# Patient Record
Sex: Male | Born: 1945 | Race: White | Hispanic: No | Marital: Married | Smoking: Former smoker
Health system: Southern US, Community
[De-identification: ages and names within clinical notes are randomized; demographics above are authoritative.]

## PROBLEM LIST (undated history)

## (undated) DIAGNOSIS — C3490 Malignant neoplasm of unspecified part of unspecified bronchus or lung: Secondary | ICD-10-CM

## (undated) DIAGNOSIS — I340 Nonrheumatic mitral (valve) insufficiency: Secondary | ICD-10-CM

## (undated) DIAGNOSIS — J189 Pneumonia, unspecified organism: Secondary | ICD-10-CM

## (undated) DIAGNOSIS — N183 Chronic kidney disease, stage 3 unspecified: Secondary | ICD-10-CM

## (undated) DIAGNOSIS — E785 Hyperlipidemia, unspecified: Secondary | ICD-10-CM

## (undated) DIAGNOSIS — J449 Chronic obstructive pulmonary disease, unspecified: Secondary | ICD-10-CM

## (undated) DIAGNOSIS — I219 Acute myocardial infarction, unspecified: Secondary | ICD-10-CM

## (undated) DIAGNOSIS — F32A Depression, unspecified: Secondary | ICD-10-CM

## (undated) DIAGNOSIS — R011 Cardiac murmur, unspecified: Secondary | ICD-10-CM

## (undated) DIAGNOSIS — R413 Other amnesia: Secondary | ICD-10-CM

## (undated) DIAGNOSIS — F329 Major depressive disorder, single episode, unspecified: Secondary | ICD-10-CM

## (undated) DIAGNOSIS — I739 Peripheral vascular disease, unspecified: Secondary | ICD-10-CM

## (undated) DIAGNOSIS — M109 Gout, unspecified: Secondary | ICD-10-CM

## (undated) DIAGNOSIS — I10 Essential (primary) hypertension: Secondary | ICD-10-CM

## (undated) DIAGNOSIS — M1990 Unspecified osteoarthritis, unspecified site: Secondary | ICD-10-CM

## (undated) DIAGNOSIS — I2510 Atherosclerotic heart disease of native coronary artery without angina pectoris: Secondary | ICD-10-CM

## (undated) DIAGNOSIS — I509 Heart failure, unspecified: Secondary | ICD-10-CM

## (undated) DIAGNOSIS — C44721 Squamous cell carcinoma of skin of unspecified lower limb, including hip: Secondary | ICD-10-CM

## (undated) DIAGNOSIS — F410 Panic disorder [episodic paroxysmal anxiety] without agoraphobia: Secondary | ICD-10-CM

## (undated) DIAGNOSIS — E039 Hypothyroidism, unspecified: Secondary | ICD-10-CM

## (undated) DIAGNOSIS — F419 Anxiety disorder, unspecified: Secondary | ICD-10-CM

## (undated) DIAGNOSIS — I209 Angina pectoris, unspecified: Secondary | ICD-10-CM

## (undated) DIAGNOSIS — D179 Benign lipomatous neoplasm, unspecified: Secondary | ICD-10-CM

## (undated) HISTORY — DX: Peripheral vascular disease, unspecified: I73.9

## (undated) HISTORY — DX: Chronic kidney disease, stage 3 unspecified: N18.30

## (undated) HISTORY — PX: HEMORRHOIDECTOMY WITH HEMORRHOID BANDING: SHX5633

## (undated) HISTORY — PX: APPENDECTOMY: SHX54

## (undated) HISTORY — DX: Nonrheumatic mitral (valve) insufficiency: I34.0

## (undated) HISTORY — DX: Malignant neoplasm of unspecified part of unspecified bronchus or lung: C34.90

## (undated) HISTORY — DX: Anxiety disorder, unspecified: F41.9

## (undated) HISTORY — DX: Atherosclerotic heart disease of native coronary artery without angina pectoris: I25.10

## (undated) HISTORY — PX: CARDIAC CATHETERIZATION: SHX172

## (undated) HISTORY — DX: Chronic kidney disease, stage 3 (moderate): N18.3

## (undated) HISTORY — DX: Cardiac murmur, unspecified: R01.1

## (undated) HISTORY — DX: Other amnesia: R41.3

## (undated) HISTORY — DX: Chronic obstructive pulmonary disease, unspecified: J44.9

## (undated) HISTORY — DX: Hyperlipidemia, unspecified: E78.5

## (undated) HISTORY — DX: Benign lipomatous neoplasm, unspecified: D17.9

## (undated) HISTORY — DX: Panic disorder (episodic paroxysmal anxiety): F41.0

## (undated) HISTORY — PX: HERNIA REPAIR: SHX51

## (undated) HISTORY — PX: COLONOSCOPY: SHX174

## (undated) HISTORY — PX: TONSILLECTOMY: SUR1361

## (undated) HISTORY — PX: SQUAMOUS CELL CARCINOMA EXCISION: SHX2433

---

## 1983-10-22 HISTORY — PX: UMBILICAL HERNIA REPAIR: SHX196

## 1995-10-22 DIAGNOSIS — I209 Angina pectoris, unspecified: Secondary | ICD-10-CM

## 1995-10-22 DIAGNOSIS — I219 Acute myocardial infarction, unspecified: Secondary | ICD-10-CM

## 1995-10-22 HISTORY — DX: Acute myocardial infarction, unspecified: I21.9

## 1995-10-22 HISTORY — PX: CORONARY ARTERY BYPASS GRAFT: SHX141

## 1995-10-22 HISTORY — DX: Angina pectoris, unspecified: I20.9

## 2008-08-12 ENCOUNTER — Ambulatory Visit: Payer: Self-pay | Admitting: Cardiology

## 2008-08-24 ENCOUNTER — Ambulatory Visit: Payer: Self-pay | Admitting: Cardiology

## 2008-08-29 ENCOUNTER — Ambulatory Visit: Payer: Self-pay | Admitting: Cardiology

## 2009-02-28 ENCOUNTER — Ambulatory Visit: Payer: Self-pay | Admitting: Cardiology

## 2009-07-06 ENCOUNTER — Encounter: Payer: Self-pay | Admitting: Cardiology

## 2009-08-06 DIAGNOSIS — E785 Hyperlipidemia, unspecified: Secondary | ICD-10-CM

## 2009-08-06 DIAGNOSIS — I739 Peripheral vascular disease, unspecified: Secondary | ICD-10-CM | POA: Insufficient documentation

## 2009-08-06 DIAGNOSIS — R011 Cardiac murmur, unspecified: Secondary | ICD-10-CM

## 2009-08-06 DIAGNOSIS — I25810 Atherosclerosis of coronary artery bypass graft(s) without angina pectoris: Secondary | ICD-10-CM

## 2011-03-05 NOTE — Assessment & Plan Note (Signed)
[facility]                          [facility]   RYE, DECOSTE                     MRN:          [id number]  DATE:02/28/2009                            DOB:          1945-11-03    HISTORY OF PRESENT ILLNESS:  Mr. Bloxham returns for Cardiology  followup.  I had seen him last on August 12, 2008.  At that time, we  decided to do a 2-D echo.  Also we switched his Plavix to aspirin.  Since that time he has done relatively well.  He has developed some GI  symptoms and this could be related to the aspirin.  We will switch him  back to Plavix.  His wife will be in note today asking me if any  decreased memory could be related to the switch from Plavix to aspirin.  I doubt this is the case.  In general, I think he needs to follow up  with Dr. Dimas Aguas for overall medical followup.   A 2-D echo revealed good LV function.  His EF was in the 50-55% range.  He had mild mitral regurgitation.   PAST MEDICAL HISTORY:   ALLERGIES:  No known drug allergies.   MEDICATIONS:  1. Aspirin (to be stopped and switched back to Plavix).  2. Potassium.  3. Allopurinol.  4. Simvastatin.  5. Lisinopril.  6. Furosemide.   OTHER MEDICAL PROBLEMS:  See the list below.   REVIEW OF SYSTEMS:  He is not having any fevers, chills, or skin rashes.  There are no headaches.  There is no change in his vision or his  hearing.  He is not having a cough.  He has some mild shortness of  breath.  There is no chest pain.  He is not having any GU symptoms.  As  mentioned, he is having some symptoms that might represent some  irritation from his aspirin in his upper GI tract.  He is not having any  swelling.  All other systems are reviewed and are negative.   PHYSICAL EXAMINATION:  VITAL SIGNS:  Blood pressure is 132/72 with a  pulse of 65.  GENERAL:  The patient is oriented to person, time and place.  Affect is  normal.  He does smell of cigarette smoke.  HEENT:  Reveals no xanthelasma.  He has normal extraocular motion.  NECK:  There are no carotid bruits.  There is no jugular venous  distention.  LUNGS:  Clear.  Respiratory effort is not labored.  CARDIAC:  Reveals S1 with an S2.  There are no clicks or significant  murmurs.  ABDOMEN:  Soft.  EXTREMITIES:  He has no peripheral edema.   PROBLEMS:  Include  1. Systolic murmur.  There are no major abnormalities by      echocardiogram.  2. Ongoing smoking.  Once again we talked about this.  He will      consider trying the nicotine patches.  3. Lipoma of the left thigh in the past.  4. Coronary artery disease post coronary artery bypass graft in 1997.      His last exercise test  was in 2004.  He is not having any      significant symptoms.  5. History of ejection fraction in the 50% range.  Recent 2-D      echocardiogram re-verified, his ejection fraction is in the 50-55%      range.  6. Peripheral vascular disease followed by Dr. Waverly Ferrari in      [place].  7. Significant chronic obstructive pulmonary disease.  8. History of anxiety and panic attacks.  9. Hyperlipidemia, treated.  10.Decreased memory per the wife.  He needs to follow up with Dr.      Dimas Aguas.   The patient is stable.  We will switch him back from aspirin to Plavix,  as he seems to do better with this.  I will see him for Cardiology  followup in 6 months.     Luis Abed, MD, [facility]  Electronically Signed    JDK/MedQ  DD: 02/28/2009  DT: 03/01/2009  Job #: [id number]   cc:   Selinda Flavin

## 2011-03-05 NOTE — Assessment & Plan Note (Signed)
[facility]                          [facility]   Manuel Rivera                     MRN:          [id number]  DATE:08/29/2008                            DOB:          01-Jun-1946    Mr. Manuel Rivera re-established with [place] on August 12, 2008.  At that time,  there was a soft systolic murmur and decision was made to proceed with  an echo to assess for murmurs and reassess LV function.  His echo  revealed ejection fraction 50-55%.  There was only mild mitral  regurgitation.  He is not having any significant chest pain.  He is  having some problems with erectile dysfunction.   ALLERGIES:  No known drug allergies.   MEDICATIONS:  See list in the chart.   REVIEW OF SYSTEMS:  He has no GI or GU symptoms.  He is having some  erectile dysfunction.  He has no fevers or chills or skin rashes.  Otherwise, review of systems is negative.   PHYSICAL EXAMINATION:  Blood pressure 140/80 with a pulse of 62.  The  patient is oriented to person, time and place.  Affect is normal.  HEENT  reveals no xanthelasma.  He has normal extraocular motion.  There are no  carotid bruits.  There is no jugular venous distention.  Lungs are  clear.  Respiratory effort is not labored.  Cardiac exam reveals an S1  with an S2.  There are no clicks.  There is a soft systolic murmur.  The  abdomen is soft.  He has no peripheral edema.   Problems are listed on my note of August 12, 2008.  His coronary artery  disease is stable.  With his peripheral vascular disease, we do need to  proceed with a carotid Doppler and this will be arranged.  Also since he  is not using nitroglycerin, it is okay with me for him to use Viagra.  [name].   I will see him in 6 months.  At that time, we will consider whether we  should proceed with exercise testing.  With his Plavix runs out, he will  start full-dose aspirin.     Manuel Abed, MD,  [facility]  Electronically Signed    JDK/MedQ  DD: 08/29/2008  DT: 08/29/2008  Job #: [id number]   cc:   Selinda Flavin

## 2011-03-05 NOTE — Assessment & Plan Note (Signed)
[facility]                          [facility]   TRI, CHITTICK                     MRN:          [id number]  DATE:08/12/2008                            DOB:          February 08, 1946    HISTORY OF PRESENT ILLNESS:  Mr. Ayo is here to re-establish cardiac  care.  I saw him last in 2004.  He does have known coronary artery  disease.  He has not been having any significant chest pain.  He has  been under enormous stress with his daughter of having been hospitalized  for 6 months and now is on a ventilator at his home.  He has tried to  stay off of cigarettes.  He has quit twice overtime and is smoking  again.  He is going to begin nicotine patches.  He is very active.  He  had been having difficulty with pain in his legs and had peripheral  vascular disease.  He was on Plavix through Dr. Cari Caraway.  With  exercising, his legs improved and he is not having any significant  claudication at this time.   The patient has known coronary artery disease.  He underwent CABG in  1997.  Historically, his ejection fraction is in the 50% range.  He is  also followed for hyperlipidemia.   PAST MEDICAL HISTORY:   ALLERGIES:  No known drug allergies.   MEDICATIONS:  1. Plavix 75 (to be changed back to full dose aspirin).  2. Potassium 20.  3. Allopurinol 300.  4. Simvastatin 40.  5. Lisinopril 10.  6. Furosemide 80.  7. Alprazolam for anxiety.   OTHER MEDICAL PROBLEMS:  See the list below.   REVIEW OF SYSTEMS:  He is not having any GI or GU symptoms.  He has no  fevers or chills.  Otherwise, his review of systems is negative.   PHYSICAL EXAMINATION:  VITAL SIGNS:  Blood pressure is 145/86 with a  pulse of 76.  GENERAL:  The patient is oriented to person, time, and place.  Affect is  normal.  HEENT:  Reveals no xanthelasma.  He has normal extraocular motion.  NECK:  There are no carotid bruits.  There is no jugular venous  distention.  LUNGS:  Clear.  Respiratory effort is nonlabored.  CARDIAC:  Reveals an S1 with an S2.  There is a soft systolic murmur.  ABDOMEN:  Soft.  EXTREMITIES:  He has no peripheral edema.   EKG reveals normal sinus rhythm.  He has mild decreased anterior R-wave  progression.  This is unchanged from the past.   PROBLEMS INCLUDE:  1. Systolic murmur.  He needs a 2D echo to assess this further.  2. Ongoing smoking.  I have talked with him about this and he will      start to use nitroglycerin patches.  3. General disability.  4. Lipoma of his left thigh in the past.  5. Coronary artery disease, post coronary artery bypass graft x4 in      1997.  I have decided not to proceed with an exercise test at this  time.  He had a study in 2004.  He is not having any significant      symptoms.  6. History of ejection fraction in the 50% range.  We do need a 2D      echo to assess his left ventricular function and his valvular      function.  7. Peripheral vascular disease.  This had been followed by Dr. Cari Caraway, but with exercise, the patient is much improved.  8. Chronic obstructive pulmonary disease.  9. History of anxiety and panic attacks.  10.Hyperlipidemia on medication through Dr. Selinda Flavin.   Mr. Lortie is stable at this point.  We will proceed with a 2D echo to  assess the issues mentioned above.  I will then see him back and discuss  these issues and review the data with him.  The patient will change his  Plavix to aspirin when his Plavix runs out.     Luis Abed, MD, [facility]  Electronically Signed    JDK/MedQ  DD: 08/12/2008  DT: 08/13/2008  Job #: [id number]   cc:   Selinda Flavin

## 2012-10-21 DIAGNOSIS — C3490 Malignant neoplasm of unspecified part of unspecified bronchus or lung: Secondary | ICD-10-CM

## 2012-10-21 HISTORY — DX: Malignant neoplasm of unspecified part of unspecified bronchus or lung: C34.90

## 2012-10-21 HISTORY — PX: BRONCHIAL WASHINGS: SHX5105

## 2013-04-21 DIAGNOSIS — F17200 Nicotine dependence, unspecified, uncomplicated: Secondary | ICD-10-CM

## 2013-04-21 DIAGNOSIS — C3410 Malignant neoplasm of upper lobe, unspecified bronchus or lung: Secondary | ICD-10-CM

## 2013-04-21 DIAGNOSIS — Q619 Cystic kidney disease, unspecified: Secondary | ICD-10-CM

## 2013-04-21 DIAGNOSIS — I10 Essential (primary) hypertension: Secondary | ICD-10-CM

## 2013-04-27 DIAGNOSIS — C3410 Malignant neoplasm of upper lobe, unspecified bronchus or lung: Secondary | ICD-10-CM

## 2013-04-27 DIAGNOSIS — Z5111 Encounter for antineoplastic chemotherapy: Secondary | ICD-10-CM

## 2013-04-28 DIAGNOSIS — C3410 Malignant neoplasm of upper lobe, unspecified bronchus or lung: Secondary | ICD-10-CM

## 2013-04-28 DIAGNOSIS — Z5111 Encounter for antineoplastic chemotherapy: Secondary | ICD-10-CM

## 2013-04-29 DIAGNOSIS — Z5111 Encounter for antineoplastic chemotherapy: Secondary | ICD-10-CM

## 2013-04-29 DIAGNOSIS — C3410 Malignant neoplasm of upper lobe, unspecified bronchus or lung: Secondary | ICD-10-CM

## 2013-04-30 DIAGNOSIS — C3410 Malignant neoplasm of upper lobe, unspecified bronchus or lung: Secondary | ICD-10-CM

## 2013-04-30 DIAGNOSIS — Z5189 Encounter for other specified aftercare: Secondary | ICD-10-CM

## 2013-05-05 DIAGNOSIS — C3410 Malignant neoplasm of upper lobe, unspecified bronchus or lung: Secondary | ICD-10-CM

## 2013-05-17 DIAGNOSIS — C3490 Malignant neoplasm of unspecified part of unspecified bronchus or lung: Secondary | ICD-10-CM

## 2013-05-17 DIAGNOSIS — N289 Disorder of kidney and ureter, unspecified: Secondary | ICD-10-CM

## 2013-05-18 DIAGNOSIS — Z5111 Encounter for antineoplastic chemotherapy: Secondary | ICD-10-CM

## 2013-05-18 DIAGNOSIS — C3490 Malignant neoplasm of unspecified part of unspecified bronchus or lung: Secondary | ICD-10-CM

## 2013-05-19 DIAGNOSIS — C3490 Malignant neoplasm of unspecified part of unspecified bronchus or lung: Secondary | ICD-10-CM

## 2013-05-19 DIAGNOSIS — Z5111 Encounter for antineoplastic chemotherapy: Secondary | ICD-10-CM

## 2013-05-20 DIAGNOSIS — Z5111 Encounter for antineoplastic chemotherapy: Secondary | ICD-10-CM

## 2013-05-20 DIAGNOSIS — C3490 Malignant neoplasm of unspecified part of unspecified bronchus or lung: Secondary | ICD-10-CM

## 2013-05-21 DIAGNOSIS — Z5189 Encounter for other specified aftercare: Secondary | ICD-10-CM

## 2013-05-21 DIAGNOSIS — C3410 Malignant neoplasm of upper lobe, unspecified bronchus or lung: Secondary | ICD-10-CM

## 2013-06-15 DIAGNOSIS — Z5111 Encounter for antineoplastic chemotherapy: Secondary | ICD-10-CM

## 2013-06-15 DIAGNOSIS — C3490 Malignant neoplasm of unspecified part of unspecified bronchus or lung: Secondary | ICD-10-CM

## 2013-06-16 DIAGNOSIS — Z5111 Encounter for antineoplastic chemotherapy: Secondary | ICD-10-CM

## 2013-06-16 DIAGNOSIS — C3490 Malignant neoplasm of unspecified part of unspecified bronchus or lung: Secondary | ICD-10-CM

## 2013-06-17 DIAGNOSIS — C3490 Malignant neoplasm of unspecified part of unspecified bronchus or lung: Secondary | ICD-10-CM

## 2013-06-17 DIAGNOSIS — Z5111 Encounter for antineoplastic chemotherapy: Secondary | ICD-10-CM

## 2013-06-18 DIAGNOSIS — T451X5A Adverse effect of antineoplastic and immunosuppressive drugs, initial encounter: Secondary | ICD-10-CM

## 2013-06-18 DIAGNOSIS — D702 Other drug-induced agranulocytosis: Secondary | ICD-10-CM

## 2013-06-18 DIAGNOSIS — C3490 Malignant neoplasm of unspecified part of unspecified bronchus or lung: Secondary | ICD-10-CM

## 2013-07-13 DIAGNOSIS — C3490 Malignant neoplasm of unspecified part of unspecified bronchus or lung: Secondary | ICD-10-CM

## 2013-07-13 DIAGNOSIS — Z5111 Encounter for antineoplastic chemotherapy: Secondary | ICD-10-CM

## 2013-07-14 DIAGNOSIS — Z5111 Encounter for antineoplastic chemotherapy: Secondary | ICD-10-CM

## 2013-07-14 DIAGNOSIS — C3490 Malignant neoplasm of unspecified part of unspecified bronchus or lung: Secondary | ICD-10-CM

## 2013-07-15 DIAGNOSIS — Z5111 Encounter for antineoplastic chemotherapy: Secondary | ICD-10-CM

## 2013-07-15 DIAGNOSIS — C3490 Malignant neoplasm of unspecified part of unspecified bronchus or lung: Secondary | ICD-10-CM

## 2013-07-16 DIAGNOSIS — D702 Other drug-induced agranulocytosis: Secondary | ICD-10-CM

## 2013-07-16 DIAGNOSIS — T451X5A Adverse effect of antineoplastic and immunosuppressive drugs, initial encounter: Secondary | ICD-10-CM

## 2013-07-19 DIAGNOSIS — R0609 Other forms of dyspnea: Secondary | ICD-10-CM

## 2013-07-19 DIAGNOSIS — D539 Nutritional anemia, unspecified: Secondary | ICD-10-CM

## 2013-07-19 DIAGNOSIS — R0989 Other specified symptoms and signs involving the circulatory and respiratory systems: Secondary | ICD-10-CM

## 2013-07-19 DIAGNOSIS — C3490 Malignant neoplasm of unspecified part of unspecified bronchus or lung: Secondary | ICD-10-CM

## 2013-08-10 DIAGNOSIS — Z5111 Encounter for antineoplastic chemotherapy: Secondary | ICD-10-CM

## 2013-08-10 DIAGNOSIS — Z23 Encounter for immunization: Secondary | ICD-10-CM

## 2013-08-10 DIAGNOSIS — C3490 Malignant neoplasm of unspecified part of unspecified bronchus or lung: Secondary | ICD-10-CM

## 2013-08-10 DIAGNOSIS — D649 Anemia, unspecified: Secondary | ICD-10-CM

## 2013-08-11 DIAGNOSIS — Z5111 Encounter for antineoplastic chemotherapy: Secondary | ICD-10-CM

## 2013-08-11 DIAGNOSIS — C3490 Malignant neoplasm of unspecified part of unspecified bronchus or lung: Secondary | ICD-10-CM

## 2013-08-12 DIAGNOSIS — Z5111 Encounter for antineoplastic chemotherapy: Secondary | ICD-10-CM

## 2013-08-12 DIAGNOSIS — C3490 Malignant neoplasm of unspecified part of unspecified bronchus or lung: Secondary | ICD-10-CM

## 2013-08-13 DIAGNOSIS — D702 Other drug-induced agranulocytosis: Secondary | ICD-10-CM

## 2013-09-06 DIAGNOSIS — T451X5A Adverse effect of antineoplastic and immunosuppressive drugs, initial encounter: Secondary | ICD-10-CM

## 2013-09-06 DIAGNOSIS — D6481 Anemia due to antineoplastic chemotherapy: Secondary | ICD-10-CM

## 2013-09-06 DIAGNOSIS — C3490 Malignant neoplasm of unspecified part of unspecified bronchus or lung: Secondary | ICD-10-CM

## 2013-09-07 DIAGNOSIS — Z5111 Encounter for antineoplastic chemotherapy: Secondary | ICD-10-CM

## 2013-09-07 DIAGNOSIS — C3490 Malignant neoplasm of unspecified part of unspecified bronchus or lung: Secondary | ICD-10-CM

## 2013-09-08 DIAGNOSIS — C3490 Malignant neoplasm of unspecified part of unspecified bronchus or lung: Secondary | ICD-10-CM

## 2013-09-08 DIAGNOSIS — Z5111 Encounter for antineoplastic chemotherapy: Secondary | ICD-10-CM

## 2013-09-09 DIAGNOSIS — C3490 Malignant neoplasm of unspecified part of unspecified bronchus or lung: Secondary | ICD-10-CM

## 2013-09-09 DIAGNOSIS — Z5111 Encounter for antineoplastic chemotherapy: Secondary | ICD-10-CM

## 2013-09-10 DIAGNOSIS — D702 Other drug-induced agranulocytosis: Secondary | ICD-10-CM

## 2013-09-30 ENCOUNTER — Other Ambulatory Visit (HOSPITAL_COMMUNITY): Payer: Self-pay | Admitting: Hematology and Oncology

## 2013-09-30 DIAGNOSIS — D696 Thrombocytopenia, unspecified: Secondary | ICD-10-CM

## 2013-09-30 DIAGNOSIS — C3490 Malignant neoplasm of unspecified part of unspecified bronchus or lung: Secondary | ICD-10-CM

## 2013-09-30 DIAGNOSIS — D649 Anemia, unspecified: Secondary | ICD-10-CM

## 2013-10-08 ENCOUNTER — Ambulatory Visit (HOSPITAL_COMMUNITY): Payer: Medicare Other

## 2015-10-30 DIAGNOSIS — C3412 Malignant neoplasm of upper lobe, left bronchus or lung: Secondary | ICD-10-CM | POA: Diagnosis not present

## 2015-10-30 DIAGNOSIS — C3490 Malignant neoplasm of unspecified part of unspecified bronchus or lung: Secondary | ICD-10-CM | POA: Diagnosis not present

## 2015-10-30 DIAGNOSIS — C3410 Malignant neoplasm of upper lobe, unspecified bronchus or lung: Secondary | ICD-10-CM | POA: Diagnosis not present

## 2015-10-31 DIAGNOSIS — C3412 Malignant neoplasm of upper lobe, left bronchus or lung: Secondary | ICD-10-CM | POA: Diagnosis not present

## 2015-11-09 DIAGNOSIS — M25559 Pain in unspecified hip: Secondary | ICD-10-CM | POA: Diagnosis not present

## 2015-11-09 DIAGNOSIS — Z1389 Encounter for screening for other disorder: Secondary | ICD-10-CM | POA: Diagnosis not present

## 2015-11-09 DIAGNOSIS — C3490 Malignant neoplasm of unspecified part of unspecified bronchus or lung: Secondary | ICD-10-CM | POA: Diagnosis not present

## 2015-11-09 DIAGNOSIS — M545 Low back pain: Secondary | ICD-10-CM | POA: Diagnosis not present

## 2015-12-12 DIAGNOSIS — M170 Bilateral primary osteoarthritis of knee: Secondary | ICD-10-CM | POA: Diagnosis not present

## 2015-12-28 DIAGNOSIS — C3490 Malignant neoplasm of unspecified part of unspecified bronchus or lung: Secondary | ICD-10-CM | POA: Diagnosis not present

## 2015-12-28 DIAGNOSIS — M898X9 Other specified disorders of bone, unspecified site: Secondary | ICD-10-CM | POA: Diagnosis not present

## 2015-12-28 DIAGNOSIS — M545 Low back pain: Secondary | ICD-10-CM | POA: Diagnosis not present

## 2015-12-28 DIAGNOSIS — C3412 Malignant neoplasm of upper lobe, left bronchus or lung: Secondary | ICD-10-CM | POA: Diagnosis not present

## 2015-12-28 DIAGNOSIS — M2550 Pain in unspecified joint: Secondary | ICD-10-CM | POA: Diagnosis not present

## 2016-01-31 ENCOUNTER — Encounter (INDEPENDENT_AMBULATORY_CARE_PROVIDER_SITE_OTHER): Payer: Self-pay | Admitting: *Deleted

## 2016-01-31 DIAGNOSIS — Z95828 Presence of other vascular implants and grafts: Secondary | ICD-10-CM | POA: Diagnosis not present

## 2016-02-06 DIAGNOSIS — F418 Other specified anxiety disorders: Secondary | ICD-10-CM | POA: Diagnosis not present

## 2016-02-06 DIAGNOSIS — C3490 Malignant neoplasm of unspecified part of unspecified bronchus or lung: Secondary | ICD-10-CM | POA: Diagnosis not present

## 2016-02-06 DIAGNOSIS — J440 Chronic obstructive pulmonary disease with acute lower respiratory infection: Secondary | ICD-10-CM | POA: Diagnosis not present

## 2016-02-06 DIAGNOSIS — I10 Essential (primary) hypertension: Secondary | ICD-10-CM | POA: Diagnosis not present

## 2016-02-13 ENCOUNTER — Other Ambulatory Visit (INDEPENDENT_AMBULATORY_CARE_PROVIDER_SITE_OTHER): Payer: Self-pay | Admitting: *Deleted

## 2016-02-13 ENCOUNTER — Encounter (INDEPENDENT_AMBULATORY_CARE_PROVIDER_SITE_OTHER): Payer: Self-pay | Admitting: *Deleted

## 2016-02-13 DIAGNOSIS — Z1211 Encounter for screening for malignant neoplasm of colon: Secondary | ICD-10-CM

## 2016-02-14 DIAGNOSIS — Z85118 Personal history of other malignant neoplasm of bronchus and lung: Secondary | ICD-10-CM | POA: Diagnosis not present

## 2016-02-14 DIAGNOSIS — G319 Degenerative disease of nervous system, unspecified: Secondary | ICD-10-CM | POA: Diagnosis not present

## 2016-02-14 DIAGNOSIS — Z923 Personal history of irradiation: Secondary | ICD-10-CM | POA: Diagnosis not present

## 2016-02-14 DIAGNOSIS — R270 Ataxia, unspecified: Secondary | ICD-10-CM | POA: Diagnosis not present

## 2016-02-14 DIAGNOSIS — Z9221 Personal history of antineoplastic chemotherapy: Secondary | ICD-10-CM | POA: Diagnosis not present

## 2016-02-14 DIAGNOSIS — R531 Weakness: Secondary | ICD-10-CM | POA: Diagnosis not present

## 2016-03-02 DIAGNOSIS — H40033 Anatomical narrow angle, bilateral: Secondary | ICD-10-CM | POA: Diagnosis not present

## 2016-03-02 DIAGNOSIS — H2513 Age-related nuclear cataract, bilateral: Secondary | ICD-10-CM | POA: Diagnosis not present

## 2016-03-13 DIAGNOSIS — C3410 Malignant neoplasm of upper lobe, unspecified bronchus or lung: Secondary | ICD-10-CM | POA: Diagnosis not present

## 2016-03-13 DIAGNOSIS — Z95828 Presence of other vascular implants and grafts: Secondary | ICD-10-CM | POA: Diagnosis not present

## 2016-03-20 DIAGNOSIS — J440 Chronic obstructive pulmonary disease with acute lower respiratory infection: Secondary | ICD-10-CM | POA: Diagnosis not present

## 2016-03-20 DIAGNOSIS — C3490 Malignant neoplasm of unspecified part of unspecified bronchus or lung: Secondary | ICD-10-CM | POA: Diagnosis not present

## 2016-03-29 ENCOUNTER — Other Ambulatory Visit (INDEPENDENT_AMBULATORY_CARE_PROVIDER_SITE_OTHER): Payer: Self-pay | Admitting: *Deleted

## 2016-03-29 ENCOUNTER — Encounter (INDEPENDENT_AMBULATORY_CARE_PROVIDER_SITE_OTHER): Payer: Self-pay | Admitting: *Deleted

## 2016-03-29 NOTE — Telephone Encounter (Signed)
Patient needs trilyte 

## 2016-04-01 MED ORDER — PEG 3350-KCL-NA BICARB-NACL 420 G PO SOLR: 4000.0000 mL | Freq: Once | ORAL | Status: DC

## 2016-04-10 ENCOUNTER — Telehealth (INDEPENDENT_AMBULATORY_CARE_PROVIDER_SITE_OTHER): Payer: Self-pay | Admitting: *Deleted

## 2016-04-10 NOTE — Telephone Encounter (Signed)
Referring MD/PCP: howard   Procedure: tcs  Reason/Indication:  screening  Has patient had this procedure before?  Yes, over 20 or more  If so, when, by whom and where?    Is there a family history of colon cancer?  no  Who?  What age when diagnosed?    Is patient diabetic?   no      Does patient have prosthetic heart valve or mechanical valve?  no  Do you have a pacemaker?  no  Has patient ever had endocarditis? no  Has patient had joint replacement within last 12 months?  no  Does patient tend to be constipated or take laxatives? yes  Does patient have a history of alcohol/drug use?  no  Is patient on Coumadin, Plavix and/or Aspirin? no  Medications: kci 20 mg, hydrocodone 5/325 mg daily, alprqazolam 0.5 mg daily, colcrys 0.6 mg daily, calcium w D bid, furosemide 80 mg daily, allopurinol 300 mg daily, levothyroxine 50 mg daily, advair disk 100 mg/50 mg inhaler bid, rescue inhaler tid prn, centrum sivler daily  Allergies: latex tape  Medication Adjustment:   Procedure date & time: 05/09/16 at 1030

## 2016-04-11 DIAGNOSIS — N400 Enlarged prostate without lower urinary tract symptoms: Secondary | ICD-10-CM | POA: Diagnosis not present

## 2016-04-11 DIAGNOSIS — I700 Atherosclerosis of aorta: Secondary | ICD-10-CM | POA: Diagnosis not present

## 2016-04-11 DIAGNOSIS — Z923 Personal history of irradiation: Secondary | ICD-10-CM | POA: Diagnosis not present

## 2016-04-11 DIAGNOSIS — Z9889 Other specified postprocedural states: Secondary | ICD-10-CM | POA: Diagnosis not present

## 2016-04-11 DIAGNOSIS — C3490 Malignant neoplasm of unspecified part of unspecified bronchus or lung: Secondary | ICD-10-CM | POA: Diagnosis not present

## 2016-04-11 DIAGNOSIS — K4090 Unilateral inguinal hernia, without obstruction or gangrene, not specified as recurrent: Secondary | ICD-10-CM | POA: Diagnosis not present

## 2016-04-15 DIAGNOSIS — Z95828 Presence of other vascular implants and grafts: Secondary | ICD-10-CM | POA: Diagnosis not present

## 2016-04-15 DIAGNOSIS — F418 Other specified anxiety disorders: Secondary | ICD-10-CM | POA: Diagnosis not present

## 2016-04-15 DIAGNOSIS — K469 Unspecified abdominal hernia without obstruction or gangrene: Secondary | ICD-10-CM | POA: Diagnosis not present

## 2016-04-15 DIAGNOSIS — N401 Enlarged prostate with lower urinary tract symptoms: Secondary | ICD-10-CM | POA: Diagnosis not present

## 2016-04-15 DIAGNOSIS — E876 Hypokalemia: Secondary | ICD-10-CM | POA: Diagnosis not present

## 2016-04-15 DIAGNOSIS — R531 Weakness: Secondary | ICD-10-CM | POA: Diagnosis not present

## 2016-04-15 DIAGNOSIS — R718 Other abnormality of red blood cells: Secondary | ICD-10-CM | POA: Diagnosis not present

## 2016-04-15 DIAGNOSIS — D696 Thrombocytopenia, unspecified: Secondary | ICD-10-CM | POA: Diagnosis not present

## 2016-04-15 DIAGNOSIS — C3412 Malignant neoplasm of upper lobe, left bronchus or lung: Secondary | ICD-10-CM | POA: Diagnosis not present

## 2016-04-17 NOTE — Telephone Encounter (Signed)
agree

## 2016-05-06 ENCOUNTER — Encounter (INDEPENDENT_AMBULATORY_CARE_PROVIDER_SITE_OTHER): Payer: Self-pay | Admitting: *Deleted

## 2016-05-08 DIAGNOSIS — Z6830 Body mass index (BMI) 30.0-30.9, adult: Secondary | ICD-10-CM | POA: Diagnosis not present

## 2016-05-08 DIAGNOSIS — M545 Low back pain: Secondary | ICD-10-CM | POA: Diagnosis not present

## 2016-05-08 DIAGNOSIS — C3490 Malignant neoplasm of unspecified part of unspecified bronchus or lung: Secondary | ICD-10-CM | POA: Diagnosis not present

## 2016-05-08 DIAGNOSIS — I10 Essential (primary) hypertension: Secondary | ICD-10-CM | POA: Diagnosis not present

## 2016-05-08 DIAGNOSIS — M1000 Idiopathic gout, unspecified site: Secondary | ICD-10-CM | POA: Diagnosis not present

## 2016-05-09 ENCOUNTER — Encounter (HOSPITAL_COMMUNITY)
Admission: RE | Disposition: A | Discharge status: Home / Self Care | Payer: Self-pay | Source: Ambulatory Visit | Attending: Internal Medicine

## 2016-05-09 ENCOUNTER — Ambulatory Visit (HOSPITAL_COMMUNITY)
Admission: RE | Admit: 2016-05-09 | Discharge: 2016-05-09 | Disposition: A | Discharge status: Home / Self Care | Payer: PPO | Source: Ambulatory Visit | Attending: Internal Medicine | Admitting: Internal Medicine

## 2016-05-09 ENCOUNTER — Encounter (HOSPITAL_COMMUNITY): Payer: Self-pay | Admitting: *Deleted

## 2016-05-09 DIAGNOSIS — D125 Benign neoplasm of sigmoid colon: Secondary | ICD-10-CM | POA: Insufficient documentation

## 2016-05-09 DIAGNOSIS — E039 Hypothyroidism, unspecified: Secondary | ICD-10-CM | POA: Insufficient documentation

## 2016-05-09 DIAGNOSIS — Z951 Presence of aortocoronary bypass graft: Secondary | ICD-10-CM | POA: Insufficient documentation

## 2016-05-09 DIAGNOSIS — Z87891 Personal history of nicotine dependence: Secondary | ICD-10-CM | POA: Insufficient documentation

## 2016-05-09 DIAGNOSIS — E785 Hyperlipidemia, unspecified: Secondary | ICD-10-CM | POA: Insufficient documentation

## 2016-05-09 DIAGNOSIS — I2510 Atherosclerotic heart disease of native coronary artery without angina pectoris: Secondary | ICD-10-CM | POA: Insufficient documentation

## 2016-05-09 DIAGNOSIS — K644 Residual hemorrhoidal skin tags: Secondary | ICD-10-CM | POA: Insufficient documentation

## 2016-05-09 DIAGNOSIS — M1990 Unspecified osteoarthritis, unspecified site: Secondary | ICD-10-CM | POA: Diagnosis not present

## 2016-05-09 DIAGNOSIS — Z1211 Encounter for screening for malignant neoplasm of colon: Secondary | ICD-10-CM | POA: Insufficient documentation

## 2016-05-09 HISTORY — PX: COLONOSCOPY: SHX5424

## 2016-05-09 HISTORY — DX: Hypothyroidism, unspecified: E03.9

## 2016-05-09 HISTORY — DX: Unspecified osteoarthritis, unspecified site: M19.90

## 2016-05-09 SURGERY — COLONOSCOPY
Anesthesia: Moderate Sedation

## 2016-05-09 MED ORDER — MEPERIDINE HCL 50 MG/ML IJ SOLN
INTRAMUSCULAR | Status: DC | PRN
  Administered 2016-05-09 (×2): 25 mg via INTRAVENOUS

## 2016-05-09 MED ORDER — ATROPINE SULFATE 1 MG/ML IJ SOLN
INTRAMUSCULAR | Status: AC
  Filled 2016-05-09: qty 1

## 2016-05-09 MED ORDER — MEPERIDINE HCL 50 MG/ML IJ SOLN
INTRAMUSCULAR | Status: DC
Start: 2016-05-09 — End: 2016-05-09
  Filled 2016-05-09: qty 1

## 2016-05-09 MED ORDER — MIDAZOLAM HCL 5 MG/5ML IJ SOLN
INTRAMUSCULAR | Status: DC | PRN
  Administered 2016-05-09: 2 mg via INTRAVENOUS
  Administered 2016-05-09 (×2): 1 mg via INTRAVENOUS

## 2016-05-09 MED ORDER — MIDAZOLAM HCL 5 MG/5ML IJ SOLN
INTRAMUSCULAR | Status: AC
  Filled 2016-05-09: qty 10

## 2016-05-09 MED ORDER — STERILE WATER FOR IRRIGATION IR SOLN
Status: DC | PRN
  Administered 2016-05-09: 11:00:00

## 2016-05-09 MED ORDER — ATROPINE SULFATE 1 MG/ML IJ SOLN
INTRAMUSCULAR | Status: DC | PRN
  Administered 2016-05-09: .5 mg via INTRAVENOUS

## 2016-05-09 MED ORDER — SODIUM CHLORIDE 0.9 % IV SOLN
INTRAVENOUS | Status: DC
  Administered 2016-05-09: 10:00:00 via INTRAVENOUS

## 2016-05-09 NOTE — Op Note (Signed)
[facility] Patient Name: Manuel Rivera Procedure Date: 05/09/2016 10:46 AM MRN: [id number] Date of Birth: [birth year] Attending MD: Hildred Laser , MD CSN: [id number] Age: 70 Admit Type: Outpatient Procedure:                Colonoscopy Indications:              Screening for colorectal malignant neoplasm Providers:                Hildred Laser, MD, Otis Peak B. Sharon Seller, RN, Shelby Mattocks, Technician Referring MD:             Rory Percy, MD Medicines:                Meperidine 50 mg IV, Midazolam 4 mg IV, Atropine                            0.5 mg IV Complications:            No immediate complications. Estimated Blood Loss:     Estimated blood loss: none. Procedure:                Pre-Anesthesia Assessment:                           - Prior to the procedure, a History and Physical                            was performed, and patient medications and                            allergies were reviewed. The patient's tolerance of                            previous anesthesia was also reviewed. The risks                            and benefits of the procedure and the sedation                            options and risks were discussed with the patient.                            All questions were answered, and informed consent                            was obtained. Prior Anticoagulants: The patient                            last took naproxen 2 days prior to the procedure.                            ASA Grade Assessment: III - A patient with severe  systemic disease. After reviewing the risks and                            benefits, the patient was deemed in satisfactory                            condition to undergo the procedure.                           After obtaining informed consent, the colonoscope                            was passed under direct vision. Throughout the                            procedure, the  patient's blood pressure, pulse, and                            oxygen saturations were monitored continuously. The                            EC-[id number] ([id number]) scope was introduced through                            the anus and advanced to the the cecum, identified                            by appendiceal orifice and ileocecal valve. The                            colonoscopy was performed without difficulty. The                            patient tolerated the procedure well. The quality                            of the bowel preparation was adequate. The                            ileocecal valve, appendiceal orifice, and rectum                            were photographed. Scope In: 10:54:58 AM Scope Out: 11:13:24 AM Scope Withdrawal Time: 0 hours 15 minutes 32 seconds  Total Procedure Duration: 0 hours 18 minutes 26 seconds  Findings:      A 8 mm polyp was found in the sigmoid colon distal sigmoid colon. The       polyp was multi-lobulated. The polyp was removed with a hot snare.       Resection and retrieval were complete.      The exam was otherwise normal throughout the examined colon.      External hemorrhoids were found during retroflexion. The hemorrhoids       were small. Impression:               -  One 8 mm polyp in the sigmoid colon in the distal                            sigmoid colon, removed with a hot snare. Resected                            and retrieved.                           - External hemorrhoids. Moderate Sedation:      Moderate (conscious) sedation was administered by the endoscopy nurse       and supervised by the endoscopist. The following parameters were       monitored: oxygen saturation, heart rate, blood pressure, CO2       capnography and response to care. Total physician intraservice time was       25 minutes. Recommendation:           - Patient has a contact number available for                            emergencies. The signs and  symptoms of potential                            delayed complications were discussed with the                            patient. Return to normal activities tomorrow.                            Written discharge instructions were provided to the                            patient.                           - Resume previous diet today.                           - Continue present medications.                           - No aspirin, ibuprofen, naproxen, or other                            non-steroidal anti-inflammatory drugs for 5 days                            after polyp removal.                           - Await pathology results.                           - Repeat colonoscopy for surveillance based on  pathology results. Procedure Code(s):        --- Professional ---                           [phone], Colonoscopy, flexible; with removal of                            tumor(s), polyp(s), or other lesion(s) by snare                            technique                           99152, Moderate sedation services provided by the                            same physician or other qualified health care                            professional performing the diagnostic or                            therapeutic service that the sedation supports,                            requiring the presence of an independent trained                            observer to assist in the monitoring of the                            patient's level of consciousness and physiological                            status; initial 15 minutes of intraservice time,                            patient age 75 years or older                           [id number], Moderate sedation services; each additional                            15 minutes intraservice time Diagnosis Code(s):        --- Professional ---                           Z12.11, Encounter for screening for malignant                             neoplasm of colon                           D12.5, Benign neoplasm of sigmoid colon  K64.4, Residual hemorrhoidal skin tags CPT copyright 2016 American Medical Association. All rights reserved. The codes documented in this report are preliminary and upon coder review may  be revised to meet current compliance requirements. Hildred Laser, MD Hildred Laser, MD 05/09/2016 11:27:37 AM This report has been signed electronically. Number of Addenda: 0

## 2016-05-09 NOTE — Discharge Instructions (Signed)
No aspirin or NSAIDs for 5 days. Resume other medications and diet as before. No driving for 24 hours. Physician will call with biopsy results.  Colonoscopy, Care After Refer to this sheet in the next few weeks. These instructions provide you with information on caring for yourself after your procedure. Your health care provider may also give you more specific instructions. Your treatment has been planned according to current medical practices, but problems sometimes occur. Call your health care provider if you have any problems or questions after your procedure. WHAT TO EXPECT AFTER THE PROCEDURE  After your procedure, it is typical to have the following:  A small amount of blood in your stool.  Moderate amounts of gas and mild abdominal cramping or bloating. HOME CARE INSTRUCTIONS  Do not drive, operate machinery, or sign important documents for 24 hours.  You may shower and resume your regular physical activities, but move at a slower pace for the first 24 hours.  Take frequent rest periods for the first 24 hours.  Walk around or put a warm pack on your abdomen to help reduce abdominal cramping and bloating.  Drink enough fluids to keep your urine clear or pale yellow.  You may resume your normal diet as instructed by your health care provider. Avoid heavy or fried foods that are hard to digest.  Avoid drinking alcohol for 24 hours or as instructed by your health care provider.  Only take over-the-counter or prescription medicines as directed by your health care provider.  If a tissue sample (biopsy) was taken during your procedure:  Do not take aspirin or blood thinners for 7 days, or as instructed by your health care provider.  Do not drink alcohol for 7 days, or as instructed by your health care provider.  Eat soft foods for the first 24 hours. SEEK MEDICAL CARE IF: You have persistent spotting of blood in your stool 2-3 days after the procedure. SEEK IMMEDIATE MEDICAL  CARE IF:  You have more than a small spotting of blood in your stool.  You pass large blood clots in your stool.  Your abdomen is swollen (distended).  You have nausea or vomiting.  You have a fever.  You have increasing abdominal pain that is not relieved with medicine.   This information is not intended to replace advice given to you by your health care provider. Make sure you discuss any questions you have with your health care provider.   Document Released: 05/21/2004 Document Revised: 07/28/2013 Document Reviewed: 06/14/2013 Elsevier Interactive Patient Education [employer].

## 2016-05-09 NOTE — H&P (Signed)
Manuel Rivera is an 70 y.o. male.   Chief Complaint: Patient is here for colonoscopy. HPI: This 70 year old Caucasian male who is here for screening colonoscopy. Last exam was 20 years ago. He denies abdominal pain change in bowel habits or rectal bleeding. 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 Family history is negative for CRC.  Past Medical History  Diagnosis Date  . Hyperlipidemia   . Peripheral vascular disease, unspecified ([place])   . Coronary atherosclerosis of artery bypass graft   . Undiagnosed cardiac murmurs   . COPD (chronic obstructive pulmonary disease) ([place])   . Anxiety   . Panic attack   . Lipoma   . Memory loss   . Hypothyroidism   . Arthritis   . Cancer [facility]) 2014    Small Cell Lung Cancer    Past Surgical History  Procedure Laterality Date  . Coronary artery bypass graft    . Hemorrhoidectomy with hemorrhoid banding    . Basal cell cancer removed from right leg    . Colonoscopy      History reviewed. No pertinent family history. Social History:  reports that he quit smoking about 4 years ago. His smoking use included Cigarettes. He has a 80 pack-year smoking history. He does not have any smokeless tobacco history on file. He reports that he does not drink alcohol or use illicit drugs.  Allergies: No Known Allergies  Medications Prior to Admission  Medication Sig Dispense Refill  . ADVAIR DISKUS 100-50 MCG/DOSE AEPB Inhale 1 puff into the lungs 2 (two) times daily.  12  . albuterol (PROVENTIL HFA;VENTOLIN HFA) 108 (90 Base) MCG/ACT inhaler Inhale 1-2 puffs into the lungs every 6 (six) hours as needed for wheezing or shortness of breath.    . allopurinol (ZYLOPRIM) 300 MG tablet Take 300 mg by mouth daily.  3  . ALPRAZolam (XANAX) 0.5 MG tablet Take 0.5 mg by mouth at bedtime.  5  . calcium carbonate (OSCAL) 1500 (600 Ca) MG TABS tablet Take 600 mg of elemental calcium by mouth 2 (two)  times daily with a meal.    . colchicine 0.6 MG tablet Take 0.6 mg by mouth 2 (two) times daily.  12  . furosemide (LASIX) 80 MG tablet Take 80 mg by mouth daily.  3  . HYDROcodone-acetaminophen (NORCO/VICODIN) 5-325 MG tablet Take 1 tablet by mouth daily as needed for moderate pain.   0  . levothyroxine (SYNTHROID, LEVOTHROID) 50 MCG tablet Take 50 mcg by mouth every morning.  3  . lisinopril (PRINIVIL,ZESTRIL) 20 MG tablet Take 20 mg by mouth daily.    . magic mouthwash SOLN TAKE ONE TEASPOONFUL (5ML) BY MOUTH FOUR TIMES DAILY FOR SORES IN MOUTH FROM CHEMOTHEROPY  99  . Multiple Vitamin (MULTIVITAMIN WITH MINERALS) TABS tablet Take 1 tablet by mouth daily.    . naproxen (NAPROSYN) 500 MG tablet Take 500 mg by mouth 2 (two) times daily.  5  . polyethylene glycol-electrolytes (TRILYTE) 420 g solution Take 4,000 mLs by mouth once. 4000 mL 0  . potassium chloride SA (K-DUR,KLOR-CON) 20 MEQ tablet Take 20 mEq by mouth 2 (two) times daily.  1  . simvastatin (ZOCOR) 40 MG tablet Take 40 mg by mouth at bedtime.  3    No results found for this or any previous visit (from the past 48 hour(s)). No results found.  ROS  Blood pressure 134/64, pulse 43, temperature 98.3 F (36.8 C), temperature source  Oral, resp. rate 17, SpO2 100 %. Physical Exam  Constitutional: He appears well-developed and well-nourished.  HENT:  Mouth/Throat: Oropharynx is clear and moist.  Eyes: Conjunctivae are normal. No scleral icterus.  Neck: No thyromegaly present.  Cardiovascular:  Midsternal scar. Normal S1 and S2. No murmur or gallop.  Respiratory: Effort normal and breath sounds normal.  GI: Soft. He exhibits no distension and no mass. There is no tenderness.  Musculoskeletal: He exhibits no edema.  Lymphadenopathy:    He has no cervical adenopathy.  Neurological: He is alert.  Skin: Skin is warm and dry.     Assessment/Plan Average risk screening colonoscopy.  Hildred Laser, MD 05/09/2016, 10:44  AM

## 2016-05-13 ENCOUNTER — Telehealth (INDEPENDENT_AMBULATORY_CARE_PROVIDER_SITE_OTHER): Payer: Self-pay | Admitting: Internal Medicine

## 2016-05-13 NOTE — Telephone Encounter (Signed)
Patient's wife called, they are anxious to get results.

## 2016-05-13 NOTE — Telephone Encounter (Signed)
Dr.Rehman is calling patient with result.

## 2016-05-15 ENCOUNTER — Encounter (HOSPITAL_COMMUNITY): Payer: Self-pay | Admitting: Internal Medicine

## 2016-06-04 ENCOUNTER — Encounter (HOSPITAL_COMMUNITY): Payer: PPO | Attending: Oncology

## 2016-06-04 DIAGNOSIS — Z452 Encounter for adjustment and management of vascular access device: Secondary | ICD-10-CM

## 2016-06-04 DIAGNOSIS — Z85118 Personal history of other malignant neoplasm of bronchus and lung: Secondary | ICD-10-CM | POA: Diagnosis not present

## 2016-06-04 DIAGNOSIS — Z95828 Presence of other vascular implants and grafts: Secondary | ICD-10-CM

## 2016-06-04 MED ORDER — HEPARIN SOD (PORK) LOCK FLUSH 100 UNIT/ML IV SOLN: 500.0000 [IU] | Freq: Once | INTRAVENOUS | Status: DC

## 2016-06-04 MED ORDER — SODIUM CHLORIDE 0.9% FLUSH
10.0000 mL | Freq: Once | INTRAVENOUS | Status: AC
  Administered 2016-06-04: 10 mL via INTRAVENOUS

## 2016-06-04 MED ORDER — HEPARIN SOD (PORK) LOCK FLUSH 100 UNIT/ML IV SOLN
500.0000 [IU] | Freq: Once | INTRAVENOUS | Status: AC
  Administered 2016-06-04: 500 [IU] via INTRAVENOUS
  Filled 2016-06-04: qty 5

## 2016-06-04 MED ORDER — SODIUM CHLORIDE 0.9% FLUSH: 10.0000 mL | INTRAVENOUS | Status: DC | PRN

## 2016-06-04 NOTE — Progress Notes (Signed)
Manuel Rivera presented for Portacath access and flush. Proper placement of portacath confirmed by CXR. Portacath located left chest wall accessed with  H 20 needle. Good blood return present. Portacath flushed with 66m NS and 500U/543mHeparin and needle removed intact. Procedure without incident. Patient tolerated procedure well.

## 2016-06-04 NOTE — Patient Instructions (Signed)
[place] at [facility] Discharge Instructions  RECOMMENDATIONS MADE BY THE CONSULTANT AND ANY TEST RESULTS WILL BE SENT TO YOUR REFERRING PHYSICIAN.  Port flush today as scheduled. Return every 6-8 weeks for port flush.  Thank you for choosing [place] at [facility]: [facility] to provide your oncology and hematology care.  To afford each patient quality time with our provider, please arrive at least 15 minutes before your scheduled appointment time.   Beginning January 23rd 2017 lab work for the [facility] will be done in the  Main lab at [facility] on 1st floor. If you have a lab appointment with the [place] please come in thru the  Main Entrance and check in at the main information desk  You need to re-schedule your appointment should you arrive 10 or more minutes late.  We strive to give you quality time with our providers, and arriving late affects you and other patients whose appointments are after yours.  Also, if you no show three or more times for appointments you may be dismissed from the clinic at the providers discretion.     Again, thank you for choosing [facility].  Our hope is that these requests will decrease the amount of time that you wait before being seen by our physicians.       _____________________________________________________________  Should you have questions after your visit to [facility], please contact our office at [phone] between the hours of 8:30 a.m. and 4:30 p.m.  Voicemails left after 4:30 p.m. will not be returned until the following business day.  For prescription refill requests, have your pharmacy contact our office.         Resources For Cancer Patients and their Caregivers ? American Cancer Society: Can assist with transportation, wigs, general needs, runs Look Good Feel Better.        5137229184 ? Cancer Care: Provides financial assistance, online support  groups, medication/co-pay assistance.  1-800-813-HOPE (903) 319-8911) ? [place] Assists [place] cancer patients and their families through emotional , educational and financial support.  913-596-2749 ? [place] DSS Where to apply for food stamps, Medicaid and utility assistance. 915-528-6680 ? RCATS: Transportation to medical appointments. (831) 258-5228 ? Social Security Administration: May apply for disability if have a Stage IV cancer. ([phone] 7192184589 ? [employer], Disability and Transit Services: Assists with nutrition, care and transit needs. [place] Support Programs: '@10RELATIVEDAYS'$ @ > Cancer Support Group  2nd Tuesday of the month 1pm-2pm, Journey Room  > Creative Journey  3rd Tuesday of the month 1130am-1pm, Journey Room  > Look Good Feel Better  1st Wednesday of the month 10am-12 noon, Journey Room (Call [place] to register [phone])

## 2016-06-19 ENCOUNTER — Encounter (HOSPITAL_COMMUNITY): Payer: Self-pay | Admitting: Oncology

## 2016-06-19 ENCOUNTER — Encounter (HOSPITAL_BASED_OUTPATIENT_CLINIC_OR_DEPARTMENT_OTHER): Payer: PPO | Admitting: Oncology

## 2016-06-19 VITALS — BP 148/57 | HR 49 | Temp 98.1°F | Resp 18 | Ht 69.0 in | Wt 208.2 lb

## 2016-06-19 DIAGNOSIS — Z95828 Presence of other vascular implants and grafts: Secondary | ICD-10-CM

## 2016-06-19 DIAGNOSIS — C3492 Malignant neoplasm of unspecified part of left bronchus or lung: Secondary | ICD-10-CM

## 2016-06-19 DIAGNOSIS — R413 Other amnesia: Secondary | ICD-10-CM

## 2016-06-19 DIAGNOSIS — C3490 Malignant neoplasm of unspecified part of unspecified bronchus or lung: Secondary | ICD-10-CM | POA: Insufficient documentation

## 2016-06-19 NOTE — Patient Instructions (Signed)
[place] at [facility] Discharge Instructions  RECOMMENDATIONS MADE BY THE CONSULTANT AND ANY TEST RESULTS WILL BE SENT TO YOUR REFERRING PHYSICIAN.  You were seen by Gershon Mussel today. Port Flush in 4 weeks with labs Port Flush in 10 weeks with labs CT of chest and abdomen in 3 months. Will call about possible MRI of Brain   Thank you for choosing [place] at [facility] to provide your oncology and hematology care.  To afford each patient quality time with our provider, please arrive at least 15 minutes before your scheduled appointment time.   Beginning January 23rd 2017 lab work for the [facility] will be done in the  Main lab at [facility] on 1st floor. If you have a lab appointment with the [place] please come in thru the  Main Entrance and check in at the main information desk  You need to re-schedule your appointment should you arrive 10 or more minutes late.  We strive to give you quality time with our providers, and arriving late affects you and other patients whose appointments are after yours.  Also, if you no show three or more times for appointments you may be dismissed from the clinic at the providers discretion.     Again, thank you for choosing [facility].  Our hope is that these requests will decrease the amount of time that you wait before being seen by our physicians.       _____________________________________________________________  Should you have questions after your visit to [facility], please contact our office at [phone] between the hours of 8:30 a.m. and 4:30 p.m.  Voicemails left after 4:30 p.m. will not be returned until the following business day.  For prescription refill requests, have your pharmacy contact our office.         Resources For Cancer Patients and their Caregivers ? American Cancer Society: Can assist with transportation, wigs, general needs, runs Look  Good Feel Better.        785-859-0976 ? Cancer Care: Provides financial assistance, online support groups, medication/co-pay assistance.  1-800-813-HOPE 509-022-8919) ? [place] Assists [place] cancer patients and their families through emotional , educational and financial support.  302-518-1313 ? [place] DSS Where to apply for food stamps, Medicaid and utility assistance. 820-209-5223 ? RCATS: Transportation to medical appointments. 2058634780 ? Social Security Administration: May apply for disability if have a Stage IV cancer. 680-151-6577 458-198-8359 ? [employer], Disability and Transit Services: Assists with nutrition, care and transit needs. [place] Support Programs: '@10RELATIVEDAYS'$ @ > Cancer Support Group  2nd Tuesday of the month 1pm-2pm, Journey Room  > Creative Journey  3rd Tuesday of the month 1130am-1pm, Journey Room  > Look Good Feel Better  1st Wednesday of the month 10am-12 noon, Journey Room (Call [place] to register [phone])

## 2016-06-19 NOTE — Progress Notes (Signed)
[facility] Hematology/Oncology Consultation   Name: Manuel Rivera      MRN: [id number]   Date: 06/19/2016 Time:9:11 PM   REFERRING PHYSICIAN:  Everardo All, MD (Medical Oncology)  REASON FOR CONSULT:  Transfer of medical oncology care.   DIAGNOSIS:  Limited stage small cell lung cancer, left, diagnosed and treated in 2014.    Small cell lung cancer ([place])   03/11/2013 Initial Diagnosis    Small cell lung cancer, limited stage, diagnosed in 2014, via transbronchial/endobronchial biopsies of left hilar mass.      04/07/2013 PET scan    1. A large FDG-avid left perihilar mass is consistent with the patient's known primary lung cancer. There is adjacent interlobular septal thickening (possibly lymphangitic tumor spread) and an adjacent FDG avid 1.9 cm left upper lobe nodule.  2. A large FDG-avid AP window soft tissue mass may represent lymphatic metastasis or possibly contiguous mediastinal tumor invasion. No supraclavicular or right hilar FDG-avid adenopathy.  3. Scattered, non-FDG-avid left upper lobe groundglass heterogeneous and nodular opacities are non-specific and may be infectious, inflammatory or possibly malignant; attention on follow-up.  4. No definite evidence of FDG-avid metastatic disease in the neck, abdomen or pelvis.      04/27/2013 - 10/04/2013 Chemotherapy    Carboplatin/etoposide 6 cycles with concomitant radiation initially, by Dr. Tressie Stalker at [facility]       05/18/2013 - 06/29/2013 Radiation Therapy    6000 cGy in 30 fractions to left mid lung and mediastinum, Dr. Orlene Erm      08/19/2013 - 09/03/2013 Radiation Therapy    Whole brain prophylactic cranial radiation, 3000 cGy in 12 fractions.  Dr. Orlene Erm.      07/12/2014 Adverse Reaction    Mild memory loss noted by patient and wife times several months. Chronic fatigue that has not remitted.      01/16/2015 Imaging    CT chest-no evidence of disease      04/19/2015 Imaging   CT chest- no evidence of disease      07/28/2015 Imaging    CT of chest-stable radiation changes in the left perihilar region. No evidence of local recurrence or metastatic disease. Emphysema, diffuse atherosclerosis, upper thoracic spine sclerosis, and chronic sternal nonunion noted.      07/28/2015 Imaging    CT chest-  No evidence of disease.      10/30/2015 Imaging    CT Chest- postsurgical and postradiation changes in the left upper lobe without evidence of local recurrence. No evidence of metastatic disease in the thorax.      12/28/2015 Imaging    Bone scan- no bone scan findings to suggest metastatic malignancy. Areas of increased uptake described above are most compatible with degenerative changes.      02/14/2016 Imaging    MRI brain- No evidence of intracranial metastases.       HISTORY OF PRESENT ILLNESS:   Manuel Rivera is a 70 y.o. male with a medical history significant for tobacco abuse, left limited stage small cell lung cancer who is referred to the [facility] for establishment of ongoing oncology care for left limited stage small cell lung cancer. He was last seen by Dr. Jacquiline Doe in January of this year.  Treating physician was Dr. Tressie Stalker at [facility].  He reports issues with memory which has been an ongoing.  His wife thinks that it has progressed.  He notes that he forgets simple things such as where he placed his car  keys, the most effective route to drive to a location.  Notes from Dr. Jacquiline Doe mention that the patient has had trouble in the past with being easily irritated.  He has chronic SOB.   He otherwise is doing well without any complaints. Denies change in baseline breathing or appetite. No new headaches. No new cough.   Review of Systems  Constitutional: Negative.  Negative for chills, fever and weight loss.  HENT: Negative.   Eyes: Negative.  Negative for blurred vision and double vision.  Respiratory: Negative.  Negative for cough and  shortness of breath.   Cardiovascular: Negative.  Negative for chest pain.  Gastrointestinal: Negative.  Negative for nausea and vomiting.  Genitourinary: Negative.   Musculoskeletal: Negative.   Skin: Negative.   Neurological: Negative.  Negative for weakness and headaches.  Endo/Heme/Allergies: Negative.   Psychiatric/Behavioral: Negative.   14 point review of systems was performed and is negative except as detailed under history of present illness and above   PAST MEDICAL HISTORY:   Past Medical History:  Diagnosis Date  . Anxiety   . Arthritis   . Cancer [facility]) 2014   Small Cell Lung Cancer  . COPD (chronic obstructive pulmonary disease) ([place])   . Coronary atherosclerosis of artery bypass graft   . Hyperlipidemia   . Hypothyroidism   . Lipoma   . Memory loss   . Panic attack   . Peripheral vascular disease, unspecified ([place])   . Small cell lung cancer ([place]) 06/19/2016  . Undiagnosed cardiac murmurs     ALLERGIES: No Known Allergies    MEDICATIONS: I have reviewed the patient's current medications.    Current Outpatient Prescriptions on File Prior to Visit  Medication Sig Dispense Refill  . ADVAIR DISKUS 100-50 MCG/DOSE AEPB Inhale 1 puff into the lungs 2 (two) times daily.  12  . albuterol (PROVENTIL HFA;VENTOLIN HFA) 108 (90 Base) MCG/ACT inhaler Inhale 1-2 puffs into the lungs every 6 (six) hours as needed for wheezing or shortness of breath.    . allopurinol (ZYLOPRIM) 300 MG tablet Take 300 mg by mouth daily.  3  . ALPRAZolam (XANAX) 0.5 MG tablet Take 0.5 mg by mouth at bedtime.  5  . calcium carbonate (OSCAL) 1500 (600 Ca) MG TABS tablet Take 600 mg of elemental calcium by mouth 2 (two) times daily with a meal.    . colchicine 0.6 MG tablet Take 0.6 mg by mouth 2 (two) times daily.  12  . furosemide (LASIX) 80 MG tablet Take 80 mg by mouth daily.  3  . HYDROcodone-acetaminophen (NORCO/VICODIN) 5-325 MG tablet Take 1 tablet by mouth daily as needed for moderate  pain.   0  . levothyroxine (SYNTHROID, LEVOTHROID) 50 MCG tablet Take 50 mcg by mouth every morning.  3  . magic mouthwash SOLN TAKE ONE TEASPOONFUL (5ML) BY MOUTH FOUR TIMES DAILY FOR SORES IN MOUTH FROM CHEMOTHEROPY  99  . Multiple Vitamin (MULTIVITAMIN WITH MINERALS) TABS tablet Take 1 tablet by mouth daily.    . naproxen (NAPROSYN) 500 MG tablet Take 1 tablet (500 mg total) by mouth 2 (two) times daily.    . polyethylene glycol-electrolytes (TRILYTE) 420 g solution Take 4,000 mLs by mouth once. 4000 mL 0  . potassium chloride SA (K-DUR,KLOR-CON) 20 MEQ tablet Take 20 mEq by mouth 2 (two) times daily.  1  . simvastatin (ZOCOR) 40 MG tablet Take 40 mg by mouth at bedtime.  3   No current facility-administered medications on file prior to visit.  PAST SURGICAL HISTORY Past Surgical History:  Procedure Laterality Date  . basal cell cancer removed from right leg    . COLONOSCOPY    . COLONOSCOPY N/A 05/09/2016   Procedure: COLONOSCOPY;  Surgeon: Rogene Houston, MD;  Location: [facility] SUITE;  Service: Endoscopy;  Laterality: N/A;  1030  . CORONARY ARTERY BYPASS GRAFT    . HEMORRHOIDECTOMY WITH HEMORRHOID BANDING      FAMILY HISTORY: No family history on file. Non contributory, parents both deceased  SOCIAL HISTORY:  reports that he quit smoking about 4 years ago. His smoking use included Cigarettes. He has a 150.00 pack-year smoking history. He does not have any smokeless tobacco history on file. He reports that he does not drink alcohol or use drugs.  He is [place].  He is retired.  He has been married x 50 years.  He has 2 children, son and daughter.  Daughter has a neurologic disease that has paralyzed her from neck down requiring trach and ventilator.  She lives with the patient and his wife.  This disease struck her at the age of 68.  Social History   Social History  . Marital status: Single    Spouse name: N/A  . Number of children: N/A  . Years of education: N/A    Social History Main Topics  . Smoking status: Former Smoker    Packs/day: 3.00    Years: 50.00    Types: Cigarettes    Quit date: 05/09/2012  . Smokeless tobacco: Not on file  . Alcohol use No  . Drug use: No  . Sexual activity: Not on file   Other Topics Concern  . Not on file   Social History Narrative  . No narrative on file    PERFORMANCE STATUS: The patient's performance status is 1 - Symptomatic but completely ambulatory  PHYSICAL EXAM: Most Recent Vital Signs: Blood pressure (!) 148/57, pulse (!) 49, temperature 98.1 F (36.7 C), temperature source Oral, resp. rate 18, height '5\' 9"'$  (1.753 m), weight 208 lb 3.2 oz (94.4 kg), SpO2 100 %. General appearance: alert, appears stated age, no distress, mildly obese and accompanied by his wife. Head: Normocephalic, without obvious abnormality, atraumatic Eyes: negative findings: lids and lashes normal, conjunctivae and sclerae normal and corneas clear Throat: normal findings: oropharynx pink & moist without lesions or evidence of thrush Neck: supple, symmetrical, trachea midline Lungs: clear to auscultation bilaterally and normal percussion bilaterally Heart: regular rate and rhythm, S1, S2 normal, no murmur, click, rub or gallop Extremities: extremities normal, atraumatic, no cyanosis or edema Skin: Skin color, texture, turgor normal. No rashes or lesions Lymph nodes: Cervical, supraclavicular, and axillary nodes normal. Neurologic: Grossly normal  LABORATORY DATA:  No results found for this or any previous visit (from the past 48 hour(s)).    RADIOGRAPHY: No results found.     PATHOLOGY:  N/A  ASSESSMENT/PLAN:   Small cell lung cancer, limited stage PCI Memory Loss Port a cath  Left limited stage small cell lung cancer, treated with curative intent under the guidance of Dr. Tressie Stalker at [facility].  Treatment consisted of 6 cycles of Carboplatin/Etoposide (04/27/2013- 10/04/2013) with concurrent XRT initially to left  mid-lung and mediastinum (05/18/2013- 06/29/2013), followed by prophylactic whole brain radiation (08/19/2013- 09/03/2013). Will request copies of last imaging from [facility] performed in January.   We discussed his memory issues. They do understand the benefits of WBRT in SCLC, they understand that his memory issues may be from his WBRT. From UP to  Date : Whole brain radiation (WBRT) has both early-delayed and long-term effects on neurocognitive function in adults. Early-delayed effects typically impact learning and memory up to three months to one year following WBRT, whereas more significant long-term effects that broadly impact cognition can occur greater than one year after WBRT  Oncology history developed.  Labs in ~3 months: CBC diff, CMET.   I have reviewed the NCCN guidelines pertaining to surveillance for limited stage small cell lung cancer.  NCCN guidelines recommends the following surveillance for small cell lung cancer (limited and extensive stage) (3.2017):  A. Oncology follow-up visits every 3-4 months during years 1-2, then every 6 months for years 3-5, and then annually.   B. At every visit: H&P, CT chest/liver/adrenal, blood work as indicated  C. New pulmonary nodule should initiate work-up for potential new primary.  D. Smoking cessation intervention  E. PET/CT is not recommended for routine follow-up.  Order is placed for CT chest/abdomen in 3 months for ongoing surveillance per NCCN guidelines.  Return in 3 months for follow-up.  For patients with LS-SCLC treated with contemporary chemoradiotherapy and prophylactic cranial irradiation, overall response rates of 80 to 90 percent, including 50 to 60 percent complete response rates, are typically reported. Median survival is around 17 months, and the five-year survival rate is about 20 percent.   ORDERS PLACED FOR THIS ENCOUNTER: Orders Placed This Encounter  Procedures  . CT Abdomen W Contrast  . CT Chest W Contrast  . CBC  with Differential  . Comprehensive metabolic panel  . CBC with Differential  . Comprehensive metabolic panel    All questions were answered. The patient knows to call the clinic with any problems, questions or concerns. We can certainly see the patient much sooner if necessary.  This note is electronically signed [id number] Cyril Mourning, MD  06/19/2016 9:11 PM

## 2016-06-19 NOTE — Assessment & Plan Note (Addendum)
Left limited stage small cell lung cancer, treated with curative intent under the guidance of Dr. Tressie Stalker at [facility].  Treatment consisted of 6 cycles of Carboplatin/Etoposide (04/27/2013- 10/04/2013) with concurrent XRT initially to left mid-lung and mediastinum (05/18/2013- 06/29/2013), followed by prophylactic whole brain radiation (08/19/2013- 09/03/2013).  Oncology history developed.  Labs with next port flush: CBC diff, CMET.  I personally reviewed and went over laboratory results with the patient.  The results are noted within this dictation.  Labs in ~3 months: CBC diff, CMET.   I have reviewed the NCCN guidelines pertaining to surveillance for limited stage small cell lung cancer.  NCCN guidelines recommends the following surveillance for small cell lung cancer (limited and extensive stage) (3.2017):  A. Oncology follow-up visits every 3-4 months during years 1-2, then every 6 months for years 3-5, and then annually.   B. At every visit: H&P, CT chest/liver/adrenal, blood work as indicated  C. New pulmonary nodule should initiate work-up for potential new primary.  D. Smoking cessation intervention  E. PET/CT is not recommended for routine follow-up.  Order is placed for CT chest/abdomen in 3 months for ongoing surveillance per NCCN guidelines.  Return in 3 months for follow-up.  For patients with LS-SCLC treated with contemporary chemoradiotherapy and prophylactic cranial irradiation, overall response rates of 80 to 90 percent, including 50 to 60 percent complete response rates, are typically reported. Median survival is around 17 months, and the five-year survival rate is about 20 percent.

## 2016-06-21 ENCOUNTER — Telehealth (HOSPITAL_COMMUNITY): Payer: Self-pay | Admitting: *Deleted

## 2016-06-21 NOTE — Telephone Encounter (Signed)
-----   Message from [facility], PA-C sent at 06/19/2016  8:59 PM EDT ----- Please let patient know that he had an MRI of brain in April 2017 and we have reviewed the scans.  They are good.  We do not need to repeat MRI of brain at this time.  KEFALAS,THOMAS, PA-C 06/19/2016 9:00 PM

## 2016-06-21 NOTE — Telephone Encounter (Signed)
Pt aware that MRI from April 2017 was good and that another is not needed at this time. Pt verbalized understanding.

## 2016-07-06 ENCOUNTER — Encounter (HOSPITAL_COMMUNITY): Payer: Self-pay | Admitting: Oncology

## 2016-07-15 DIAGNOSIS — Z6831 Body mass index (BMI) 31.0-31.9, adult: Secondary | ICD-10-CM | POA: Diagnosis not present

## 2016-07-15 DIAGNOSIS — L247 Irritant contact dermatitis due to plants, except food: Secondary | ICD-10-CM | POA: Diagnosis not present

## 2016-07-18 ENCOUNTER — Encounter (HOSPITAL_COMMUNITY): Payer: PPO | Attending: Oncology

## 2016-07-18 VITALS — BP 147/67 | HR 56 | Temp 97.8°F | Resp 18

## 2016-07-18 DIAGNOSIS — C3492 Malignant neoplasm of unspecified part of left bronchus or lung: Secondary | ICD-10-CM | POA: Insufficient documentation

## 2016-07-18 DIAGNOSIS — Z23 Encounter for immunization: Secondary | ICD-10-CM | POA: Diagnosis not present

## 2016-07-18 LAB — CBC WITH DIFFERENTIAL/PLATELET
BASOS ABS: 0 10*3/uL (ref 0.0–0.1)
BASOS PCT: 0 %
EOS ABS: 0.1 10*3/uL (ref 0.0–0.7)
EOS PCT: 1 %
HCT: 41.8 % (ref 39.0–52.0)
HEMOGLOBIN: 14.1 g/dL (ref 13.0–17.0)
Lymphocytes Relative: 21 %
Lymphs Abs: 1.3 10*3/uL (ref 0.7–4.0)
MCH: 33.4 pg (ref 26.0–34.0)
MCHC: 33.7 g/dL (ref 30.0–36.0)
MCV: 99.1 fL (ref 78.0–100.0)
Monocytes Absolute: 0.5 10*3/uL (ref 0.1–1.0)
Monocytes Relative: 8 %
NEUTROS PCT: 70 %
Neutro Abs: 4.4 10*3/uL (ref 1.7–7.7)
PLATELETS: 184 10*3/uL (ref 150–400)
RBC: 4.22 MIL/uL (ref 4.22–5.81)
RDW: 14.6 % (ref 11.5–15.5)
WBC: 6.3 10*3/uL (ref 4.0–10.5)

## 2016-07-18 LAB — COMPREHENSIVE METABOLIC PANEL
ALK PHOS: 55 U/L (ref 38–126)
ALT: 28 U/L (ref 17–63)
AST: 35 U/L (ref 15–41)
Albumin: 4.4 g/dL (ref 3.5–5.0)
Anion gap: 11 (ref 5–15)
BUN: 22 mg/dL — AB (ref 6–20)
CALCIUM: 9.3 mg/dL (ref 8.9–10.3)
CHLORIDE: 98 mmol/L — AB (ref 101–111)
CO2: 28 mmol/L (ref 22–32)
CREATININE: 1.3 mg/dL — AB (ref 0.61–1.24)
GFR calc Af Amer: >60 mL/min (ref >60)
GFR, EST NON AFRICAN AMERICAN: 53 mL/min — AB (ref >60)
Glucose, Bld: 82 mg/dL (ref 65–99)
Potassium: 3.2 mmol/L — ABNORMAL LOW (ref 3.5–5.1)
Sodium: 137 mmol/L (ref 135–145)
Total Bilirubin: 0.9 mg/dL (ref 0.3–1.2)
Total Protein: 7.4 g/dL (ref 6.5–8.1)

## 2016-07-18 MED ORDER — HEPARIN SOD (PORK) LOCK FLUSH 100 UNIT/ML IV SOLN
INTRAVENOUS | Status: AC
  Filled 2016-07-18: qty 5

## 2016-07-18 MED ORDER — HEPARIN SOD (PORK) LOCK FLUSH 100 UNIT/ML IV SOLN
500.0000 [IU] | Freq: Once | INTRAVENOUS | Status: AC
  Administered 2016-07-18: 500 [IU] via INTRAVENOUS

## 2016-07-18 MED ORDER — HYDROCODONE-ACETAMINOPHEN 5-325 MG PO TABS: 1.0000 | ORAL_TABLET | Freq: Every day | ORAL | 0 refills | Status: DC | PRN

## 2016-07-18 MED ORDER — SODIUM CHLORIDE 0.9% FLUSH
20.0000 mL | INTRAVENOUS | Status: DC | PRN
  Administered 2016-07-18: 20 mL via INTRAVENOUS
  Filled 2016-07-18: qty 20

## 2016-07-18 MED ORDER — INFLUENZA VAC SPLIT QUAD 0.5 ML IM SUSY
0.5000 mL | PREFILLED_SYRINGE | Freq: Once | INTRAMUSCULAR | Status: AC
  Administered 2016-07-18: 0.5 mL via INTRAMUSCULAR
  Filled 2016-07-18: qty 0.5

## 2016-07-18 NOTE — Progress Notes (Signed)
Manuel Rivera tolerated Port lab draw and Influenza vaccine well without incident. Port flushed with 20 ml NS and 5 ml Heparin easily after blood drawn for labs. Scripts given to pt for Hydrocodone for 07/19/16 and 08/17/16 per MD. Pt discharged self ambulatory in satisfactory condition with wife

## 2016-07-18 NOTE — Patient Instructions (Signed)
[place] at [facility] Discharge Instructions  RECOMMENDATIONS MADE BY THE CONSULTANT AND ANY TEST RESULTS WILL BE SENT TO YOUR REFERRING PHYSICIAN.  Port lab draw with port flush as well as Influenza vaccine completed today. Follow-up as scheduled. Call clinic for any questions or concerns  Thank you for choosing [place] at [facility] to provide your oncology and hematology care.  To afford each patient quality time with our provider, please arrive at least 15 minutes before your scheduled appointment time.   Beginning January 23rd 2017 lab work for the [facility] will be done in the  Main lab at [facility] on 1st floor. If you have a lab appointment with the [place] please come in thru the  Main Entrance and check in at the main information desk  You need to re-schedule your appointment should you arrive 10 or more minutes late.  We strive to give you quality time with our providers, and arriving late affects you and other patients whose appointments are after yours.  Also, if you no show three or more times for appointments you may be dismissed from the clinic at the providers discretion.     Again, thank you for choosing [facility].  Our hope is that these requests will decrease the amount of time that you wait before being seen by our physicians.       _____________________________________________________________  Should you have questions after your visit to [facility], please contact our office at [phone] between the hours of 8:30 a.m. and 4:30 p.m.  Voicemails left after 4:30 p.m. will not be returned until the following business day.  For prescription refill requests, have your pharmacy contact our office.         Resources For Cancer Patients and their Caregivers ? American Cancer Society: Can assist with transportation, wigs, general needs, runs Look Good Feel Better.         [phone] ? Cancer Care: Provides financial assistance, online support groups, medication/co-pay assistance.  1-800-813-HOPE 951-315-5561) ? [place] Assists [place] cancer patients and their families through emotional , educational and financial support.  808-859-8791 ? [place] DSS Where to apply for food stamps, Medicaid and utility assistance. 364-765-4706 ? RCATS: Transportation to medical appointments. 4053702350 ? Social Security Administration: May apply for disability if have a Stage IV cancer. 623-028-4323 [phone])714-8157 ? [employer], Disability and Transit Services: Assists with nutrition, care and transit needs. [place] Support Programs: '@10RELATIVEDAYS'$ @ > Cancer Support Group  2nd Tuesday of the month 1pm-2pm, Journey Room  > Creative Journey  3rd Tuesday of the month 1130am-1pm, Journey Room  > Look Good Feel Better  1st Wednesday of the month 10am-12 noon, Journey Room (Call [place] to register [phone])

## 2016-07-21 ENCOUNTER — Other Ambulatory Visit (HOSPITAL_COMMUNITY): Payer: Self-pay | Admitting: Oncology

## 2016-07-21 DIAGNOSIS — E876 Hypokalemia: Secondary | ICD-10-CM

## 2016-07-21 MED ORDER — POTASSIUM CHLORIDE CRYS ER 20 MEQ PO TBCR: 40.0000 meq | EXTENDED_RELEASE_TABLET | Freq: Every day | ORAL | 1 refills | Status: DC

## 2016-07-30 ENCOUNTER — Encounter (HOSPITAL_COMMUNITY): Payer: PPO

## 2016-08-05 ENCOUNTER — Encounter (HOSPITAL_COMMUNITY): Payer: PPO | Attending: Hematology & Oncology

## 2016-08-05 DIAGNOSIS — E876 Hypokalemia: Secondary | ICD-10-CM

## 2016-08-05 LAB — POTASSIUM: Potassium: 4 mmol/L (ref 3.5–5.1)

## 2016-08-06 DIAGNOSIS — F418 Other specified anxiety disorders: Secondary | ICD-10-CM | POA: Diagnosis not present

## 2016-08-06 DIAGNOSIS — Z6831 Body mass index (BMI) 31.0-31.9, adult: Secondary | ICD-10-CM | POA: Diagnosis not present

## 2016-08-06 DIAGNOSIS — I10 Essential (primary) hypertension: Secondary | ICD-10-CM | POA: Diagnosis not present

## 2016-08-06 DIAGNOSIS — E7800 Pure hypercholesterolemia, unspecified: Secondary | ICD-10-CM | POA: Diagnosis not present

## 2016-08-06 DIAGNOSIS — C3490 Malignant neoplasm of unspecified part of unspecified bronchus or lung: Secondary | ICD-10-CM | POA: Diagnosis not present

## 2016-08-06 DIAGNOSIS — J440 Chronic obstructive pulmonary disease with acute lower respiratory infection: Secondary | ICD-10-CM | POA: Diagnosis not present

## 2016-08-06 DIAGNOSIS — M545 Low back pain: Secondary | ICD-10-CM | POA: Diagnosis not present

## 2016-08-06 DIAGNOSIS — M1000 Idiopathic gout, unspecified site: Secondary | ICD-10-CM | POA: Diagnosis not present

## 2016-08-13 ENCOUNTER — Encounter (HOSPITAL_COMMUNITY): Payer: PPO

## 2016-08-14 ENCOUNTER — Encounter (HOSPITAL_COMMUNITY): Payer: PPO

## 2016-08-21 DIAGNOSIS — L600 Ingrowing nail: Secondary | ICD-10-CM | POA: Diagnosis not present

## 2016-08-21 DIAGNOSIS — L03032 Cellulitis of left toe: Secondary | ICD-10-CM | POA: Diagnosis not present

## 2016-08-21 DIAGNOSIS — M79675 Pain in left toe(s): Secondary | ICD-10-CM | POA: Diagnosis not present

## 2016-08-21 DIAGNOSIS — M79672 Pain in left foot: Secondary | ICD-10-CM | POA: Diagnosis not present

## 2016-08-29 ENCOUNTER — Encounter (HOSPITAL_COMMUNITY): Payer: Self-pay

## 2016-08-29 ENCOUNTER — Encounter (HOSPITAL_COMMUNITY): Payer: PPO | Attending: Oncology

## 2016-08-29 VITALS — BP 152/65 | HR 54 | Temp 98.6°F | Resp 18

## 2016-08-29 DIAGNOSIS — C3492 Malignant neoplasm of unspecified part of left bronchus or lung: Secondary | ICD-10-CM | POA: Insufficient documentation

## 2016-08-29 DIAGNOSIS — Z95828 Presence of other vascular implants and grafts: Secondary | ICD-10-CM

## 2016-08-29 DIAGNOSIS — Z23 Encounter for immunization: Secondary | ICD-10-CM | POA: Insufficient documentation

## 2016-08-29 LAB — COMPREHENSIVE METABOLIC PANEL
ALT: 27 U/L (ref 17–63)
AST: 31 U/L (ref 15–41)
Albumin: 4.6 g/dL (ref 3.5–5.0)
Alkaline Phosphatase: 61 U/L (ref 38–126)
Anion gap: 9 (ref 5–15)
BUN: 31 mg/dL — AB (ref 6–20)
CHLORIDE: 99 mmol/L — AB (ref 101–111)
CO2: 29 mmol/L (ref 22–32)
Calcium: 10.3 mg/dL (ref 8.9–10.3)
Creatinine, Ser: 2.44 mg/dL — ABNORMAL HIGH (ref 0.61–1.24)
GFR, EST AFRICAN AMERICAN: 29 mL/min — AB (ref >60)
GFR, EST NON AFRICAN AMERICAN: 25 mL/min — AB (ref >60)
Glucose, Bld: 83 mg/dL (ref 65–99)
POTASSIUM: 3.7 mmol/L (ref 3.5–5.1)
Sodium: 137 mmol/L (ref 135–145)
Total Bilirubin: 0.7 mg/dL (ref 0.3–1.2)
Total Protein: 7.8 g/dL (ref 6.5–8.1)

## 2016-08-29 LAB — CBC WITH DIFFERENTIAL/PLATELET
BASOS ABS: 0 10*3/uL (ref 0.0–0.1)
Basophils Relative: 0 %
EOS ABS: 0.1 10*3/uL (ref 0.0–0.7)
EOS PCT: 2 %
HCT: 42.6 % (ref 39.0–52.0)
Hemoglobin: 14.5 g/dL (ref 13.0–17.0)
LYMPHS PCT: 20 %
Lymphs Abs: 1 10*3/uL (ref 0.7–4.0)
MCH: 34 pg (ref 26.0–34.0)
MCHC: 34 g/dL (ref 30.0–36.0)
MCV: 100 fL (ref 78.0–100.0)
MONO ABS: 0.4 10*3/uL (ref 0.1–1.0)
Monocytes Relative: 9 %
Neutro Abs: 3.5 10*3/uL (ref 1.7–7.7)
Neutrophils Relative %: 69 %
PLATELETS: 175 10*3/uL (ref 150–400)
RBC: 4.26 MIL/uL (ref 4.22–5.81)
RDW: 14 % (ref 11.5–15.5)
WBC: 5 10*3/uL (ref 4.0–10.5)

## 2016-08-29 MED ORDER — HEPARIN SOD (PORK) LOCK FLUSH 100 UNIT/ML IV SOLN
INTRAVENOUS | Status: AC
Start: 2016-08-29 — End: 2016-08-29
  Filled 2016-08-29: qty 5

## 2016-08-29 MED ORDER — SODIUM CHLORIDE 0.9% FLUSH
10.0000 mL | INTRAVENOUS | Status: DC | PRN
  Administered 2016-08-29: 10 mL via INTRAVENOUS
  Filled 2016-08-29: qty 10

## 2016-08-29 MED ORDER — HEPARIN SOD (PORK) LOCK FLUSH 100 UNIT/ML IV SOLN
500.0000 [IU] | Freq: Once | INTRAVENOUS | Status: AC
  Administered 2016-08-29: 500 [IU] via INTRAVENOUS

## 2016-08-29 MED ORDER — HYDROCODONE-ACETAMINOPHEN 5-325 MG PO TABS: 1.0000 | ORAL_TABLET | ORAL | 0 refills | Status: DC | PRN

## 2016-08-29 NOTE — Patient Instructions (Signed)
[place] at [facility] Discharge Instructions  RECOMMENDATIONS MADE BY THE CONSULTANT AND ANY TEST RESULTS WILL BE SENT TO YOUR REFERRING PHYSICIAN.  Port flush with labs today. Follow up as scheduled.  Thank you for choosing [place] at [facility] to provide your oncology and hematology care.  To afford each patient quality time with our provider, please arrive at least 15 minutes before your scheduled appointment time.   Beginning January 23rd 2017 lab work for the [facility] will be done in the  Main lab at [facility] on 1st floor. If you have a lab appointment with the [facility] please come in thru the  Main Entrance and check in at the main information desk  You need to re-schedule your appointment should you arrive 10 or more minutes late.  We strive to give you quality time with our providers, and arriving late affects you and other patients whose appointments are after yours.  Also, if you no show three or more times for appointments you may be dismissed from the clinic at the providers discretion.     Again, thank you for choosing [facility].  Our hope is that these requests will decrease the amount of time that you wait before being seen by our physicians.       _____________________________________________________________  Should you have questions after your visit to [facility], please contact our office at [phone] between the hours of 8:30 a.m. and 4:30 p.m.  Voicemails left after 4:30 p.m. will not be returned until the following business day.  For prescription refill requests, have your pharmacy contact our office.         Resources For Cancer Patients and their Caregivers ? American Cancer Society: Can assist with transportation, wigs, general needs, runs Look Good Feel Better.        (352)781-7916 ? Cancer Care: Provides financial assistance, online support groups,  medication/co-pay assistance.  1-800-813-HOPE (713)270-9224) ? [place] Assists [place] cancer patients and their families through emotional , educational and financial support.  9013722681 ? [place] DSS Where to apply for food stamps, Medicaid and utility assistance. 775 837 1944 ? [facility]: Transportation to medical appointments. 678-439-4437 ? Social Security Administration: May apply for disability if have a Stage IV cancer. 573-793-8446 364-572-3267 ? [employer], Disability and Transit Services: Assists with nutrition, care and transit needs. [place] Support Programs: '@10RELATIVEDAYS'$ @ > Cancer Support Group  2nd Tuesday of the month 1pm-2pm, Journey Room  > Creative Journey  3rd Tuesday of the month 1130am-1pm, Journey Room  > Look Good Feel Better  1st Wednesday of the month 10am-12 noon, Journey Room (Call [place] to register [phone])

## 2016-08-29 NOTE — Progress Notes (Signed)
Manuel Rivera presented for Portacath access and flush. Portacath located left chest wall accessed with  H 20 needle. Good blood return present. Portacath flushed with 36m NS and 500U/537mHeparin and needle removed intact. Procedure without incident. Patient tolerated procedure well. Labs drawn too. Vitals stable, discharged from clinic ambulatory.  Pain script refilled today. Follow up as scheduled.

## 2016-09-04 DIAGNOSIS — L600 Ingrowing nail: Secondary | ICD-10-CM | POA: Diagnosis not present

## 2016-09-04 DIAGNOSIS — M79671 Pain in right foot: Secondary | ICD-10-CM | POA: Diagnosis not present

## 2016-09-04 DIAGNOSIS — M79674 Pain in right toe(s): Secondary | ICD-10-CM | POA: Diagnosis not present

## 2016-09-11 ENCOUNTER — Other Ambulatory Visit (HOSPITAL_COMMUNITY): Payer: Self-pay | Admitting: Oncology

## 2016-09-11 DIAGNOSIS — E876 Hypokalemia: Secondary | ICD-10-CM

## 2016-09-18 DIAGNOSIS — M79671 Pain in right foot: Secondary | ICD-10-CM | POA: Diagnosis not present

## 2016-09-18 DIAGNOSIS — L03031 Cellulitis of right toe: Secondary | ICD-10-CM | POA: Diagnosis not present

## 2016-09-18 DIAGNOSIS — L03032 Cellulitis of left toe: Secondary | ICD-10-CM | POA: Diagnosis not present

## 2016-09-18 DIAGNOSIS — L600 Ingrowing nail: Secondary | ICD-10-CM | POA: Diagnosis not present

## 2016-09-19 ENCOUNTER — Ambulatory Visit (HOSPITAL_COMMUNITY)
Admission: RE | Admit: 2016-09-19 | Discharge: 2016-09-19 | Disposition: A | Discharge status: Home / Self Care | Payer: PPO | Source: Ambulatory Visit | Attending: Oncology | Admitting: Oncology

## 2016-09-19 DIAGNOSIS — C3492 Malignant neoplasm of unspecified part of left bronchus or lung: Secondary | ICD-10-CM | POA: Insufficient documentation

## 2016-09-19 DIAGNOSIS — C3490 Malignant neoplasm of unspecified part of unspecified bronchus or lung: Secondary | ICD-10-CM | POA: Diagnosis not present

## 2016-09-20 ENCOUNTER — Encounter (HOSPITAL_COMMUNITY): Payer: PPO | Attending: Oncology | Admitting: Hematology & Oncology

## 2016-09-20 ENCOUNTER — Encounter (HOSPITAL_COMMUNITY): Payer: Self-pay | Admitting: Hematology & Oncology

## 2016-09-20 ENCOUNTER — Encounter (HOSPITAL_BASED_OUTPATIENT_CLINIC_OR_DEPARTMENT_OTHER): Payer: PPO

## 2016-09-20 VITALS — BP 166/80 | HR 44 | Temp 97.6°F | Resp 16 | Wt 206.7 lb

## 2016-09-20 DIAGNOSIS — Z23 Encounter for immunization: Secondary | ICD-10-CM | POA: Diagnosis not present

## 2016-09-20 DIAGNOSIS — N179 Acute kidney failure, unspecified: Secondary | ICD-10-CM | POA: Diagnosis not present

## 2016-09-20 DIAGNOSIS — Z95828 Presence of other vascular implants and grafts: Secondary | ICD-10-CM

## 2016-09-20 DIAGNOSIS — R413 Other amnesia: Secondary | ICD-10-CM

## 2016-09-20 DIAGNOSIS — C3492 Malignant neoplasm of unspecified part of left bronchus or lung: Secondary | ICD-10-CM

## 2016-09-20 LAB — CBC WITH DIFFERENTIAL/PLATELET
Basophils Absolute: 0 10*3/uL (ref 0.0–0.1)
Basophils Relative: 0 %
EOS PCT: 3 %
Eosinophils Absolute: 0.2 10*3/uL (ref 0.0–0.7)
HEMATOCRIT: 42.3 % (ref 39.0–52.0)
Hemoglobin: 14.2 g/dL (ref 13.0–17.0)
LYMPHS ABS: 1.5 10*3/uL (ref 0.7–4.0)
LYMPHS PCT: 26 %
MCH: 33.8 pg (ref 26.0–34.0)
MCHC: 33.6 g/dL (ref 30.0–36.0)
MCV: 100.7 fL — AB (ref 78.0–100.0)
MONO ABS: 0.5 10*3/uL (ref 0.1–1.0)
Monocytes Relative: 10 %
NEUTROS ABS: 3.5 10*3/uL (ref 1.7–7.7)
Neutrophils Relative %: 61 %
Platelets: 164 10*3/uL (ref 150–400)
RBC: 4.2 MIL/uL — AB (ref 4.22–5.81)
RDW: 14.7 % (ref 11.5–15.5)
WBC: 5.7 10*3/uL (ref 4.0–10.5)

## 2016-09-20 LAB — BASIC METABOLIC PANEL
ANION GAP: 8 (ref 5–15)
BUN: 23 mg/dL — AB (ref 6–20)
CO2: 30 mmol/L (ref 22–32)
Calcium: 9.5 mg/dL (ref 8.9–10.3)
Chloride: 99 mmol/L — ABNORMAL LOW (ref 101–111)
Creatinine, Ser: 1.41 mg/dL — ABNORMAL HIGH (ref 0.61–1.24)
GFR calc Af Amer: 56 mL/min — ABNORMAL LOW (ref >60)
GFR calc non Af Amer: 48 mL/min — ABNORMAL LOW (ref >60)
GLUCOSE: 94 mg/dL (ref 65–99)
POTASSIUM: 3.5 mmol/L (ref 3.5–5.1)
Sodium: 137 mmol/L (ref 135–145)

## 2016-09-20 MED ORDER — HEPARIN SOD (PORK) LOCK FLUSH 100 UNIT/ML IV SOLN
500.0000 [IU] | Freq: Once | INTRAVENOUS | Status: AC
  Administered 2016-09-20: 500 [IU] via INTRAVENOUS

## 2016-09-20 MED ORDER — HEPARIN SOD (PORK) LOCK FLUSH 100 UNIT/ML IV SOLN
INTRAVENOUS | Status: AC
  Filled 2016-09-20: qty 5

## 2016-09-20 MED ORDER — HYDROCODONE-ACETAMINOPHEN 5-325 MG PO TABS: 1.0000 | ORAL_TABLET | ORAL | 0 refills | Status: DC | PRN

## 2016-09-20 MED ORDER — SODIUM CHLORIDE 0.9% FLUSH
10.0000 mL | Freq: Once | INTRAVENOUS | Status: AC
  Administered 2016-09-20: 10 mL via INTRAVENOUS

## 2016-09-20 NOTE — Progress Notes (Signed)
Manuel Rivera presented for Portacath access and flush. Proper placement of portacath confirmed by CXR. Portacath located left chest wall accessed with  H 20 needle. Good blood return present. Portacath flushed with 80m NS and 500U/582mHeparin and needle removed intact. Procedure without incident. Patient tolerated procedure well.

## 2016-09-20 NOTE — Progress Notes (Signed)
[facility] Hematology/Oncology Progress Note  Name: Manuel Rivera      MRN: [id number]   Date: 09/20/2016 Time:1:31 PM   REFERRING PHYSICIAN:  Everardo All, MD (Medical Oncology)  REASON FOR CONSULT:  Transfer of medical oncology care.   DIAGNOSIS:  Limited stage small cell lung cancer, left, diagnosed and treated in 2014.    Small cell lung cancer ([place])   03/11/2013 Initial Diagnosis    Small cell lung cancer, limited stage, diagnosed in 2014, via transbronchial/endobronchial biopsies of left hilar mass.      04/07/2013 PET scan    1. A large FDG-avid left perihilar mass is consistent with the patient's known primary lung cancer. There is adjacent interlobular septal thickening (possibly lymphangitic tumor spread) and an adjacent FDG avid 1.9 cm left upper lobe nodule.  2. A large FDG-avid AP window soft tissue mass may represent lymphatic metastasis or possibly contiguous mediastinal tumor invasion. No supraclavicular or right hilar FDG-avid adenopathy.  3. Scattered, non-FDG-avid left upper lobe groundglass heterogeneous and nodular opacities are non-specific and may be infectious, inflammatory or possibly malignant; attention on follow-up.  4. No definite evidence of FDG-avid metastatic disease in the neck, abdomen or pelvis.      04/27/2013 - 10/04/2013 Chemotherapy    Carboplatin/etoposide 6 cycles with concomitant radiation initially, by Dr. Tressie Stalker at [facility].       05/18/2013 - 06/29/2013 Radiation Therapy    6000 cGy in 30 fractions to left mid lung and mediastinum, Dr. Orlene Erm      08/19/2013 - 09/03/2013 Radiation Therapy    Whole brain prophylactic cranial radiation, 3000 cGy in 12 fractions.  Dr. Orlene Erm.      07/12/2014 Adverse Reaction    Mild memory loss noted by patient and wife times several months. Chronic fatigue that has not remitted.      01/16/2015 Imaging    CT chest-no evidence of disease      04/19/2015 Imaging   CT chest- no evidence of disease      07/28/2015 Imaging    CT of chest-stable radiation changes in the left perihilar region. No evidence of local recurrence or metastatic disease. Emphysema, diffuse atherosclerosis, upper thoracic spine sclerosis, and chronic sternal nonunion noted.      07/28/2015 Imaging    CT chest-  No evidence of disease.      10/30/2015 Imaging    CT Chest- postsurgical and postradiation changes in the left upper lobe without evidence of local recurrence. No evidence of metastatic disease in the thorax.      12/28/2015 Imaging    Bone scan- no bone scan findings to suggest metastatic malignancy. Areas of increased uptake described above are most compatible with degenerative changes.      02/14/2016 Imaging    MRI brain- No evidence of intracranial metastases.      09/19/2016 Imaging    CT CAP- Stable appearance of the left upper lobe without evidence for local recurrence.  No evidence for metastatic disease in the chest or abdomen on noncontrast enhanced exam.       HISTORY OF PRESENT ILLNESS:   Manuel Rivera is a 70 y.o. male with a medical history significant for tobacco abuse, left limited stage small cell lung cancer who is referred to the [facility] for establishment of ongoing oncology care for left limited stage small cell lung cancer. Last seen at [facility] in August.   Manuel Rivera presents to the cancer center  today accompanied by his wife. He is here to review recent restaging CT scans.   I have reviewed the labs and scans with the patient.   His wife says he couldn't get his CT scan with contrast since his kidney function was low. She is concerned the images aren't as good. He admits that he could have been dehydrated when he went for his scan.   He has not started any new medications in the last few weeks.   He stays active, but he does have bad knees. He says he walks about 3 miles a day.  His port was flushed in the beginning  of November.   He has had his flu shot.  He denies any headaches. No new cough or change in baseline breathing.  Appetite remains unchanged.   Results for Manuel Rivera (MRN [id number])   Ref. Range 07/18/2016 15:25 08/29/2016 15:14 09/20/2016 13:46  Creatinine Latest Ref Range: 0.61 - 1.24 mg/dL 1.30 (H) 2.44 (H) 1.41 (H)     Review of Systems  Constitutional: Negative.   HENT: Negative.   Eyes: Negative.   Respiratory: Negative.   Cardiovascular: Negative.   Gastrointestinal: Negative.   Genitourinary: Negative.   Musculoskeletal: Positive for joint pain (knees).  Skin: Negative.   Neurological: Negative.   Endo/Heme/Allergies: Negative.   Psychiatric/Behavioral: Negative.   14 point review of systems was performed and is negative except as detailed under history of present illness and above   PAST MEDICAL HISTORY:   Past Medical History:  Diagnosis Date  . Anxiety   . Arthritis   . Cancer [facility]) 2014   Small Cell Lung Cancer  . COPD (chronic obstructive pulmonary disease) ([place])   . Coronary atherosclerosis of artery bypass graft   . Hyperlipidemia   . Hypothyroidism   . Lipoma   . Memory loss   . Panic attack   . Peripheral vascular disease, unspecified   . Small cell lung cancer ([place]) 06/19/2016  . Undiagnosed cardiac murmurs     ALLERGIES: No Known Allergies    MEDICATIONS: I have reviewed the patient's current medications.    Current Outpatient Prescriptions on File Prior to Visit  Medication Sig Dispense Refill  . ADVAIR DISKUS 100-50 MCG/DOSE AEPB Inhale 1 puff into the lungs 2 (two) times daily.  12  . albuterol (PROVENTIL HFA;VENTOLIN HFA) 108 (90 Base) MCG/ACT inhaler Inhale 1-2 puffs into the lungs every 6 (six) hours as needed for wheezing or shortness of breath.    . allopurinol (ZYLOPRIM) 300 MG tablet Take 300 mg by mouth daily.  3  . ALPRAZolam (XANAX) 0.5 MG tablet Take 0.5 mg by mouth at bedtime.  5  . beta carotene 25000 UNIT capsule Take by  mouth.    . calcium carbonate (OSCAL) 1500 (600 Ca) MG TABS tablet Take 600 mg of elemental calcium by mouth 2 (two) times daily with a meal.    . colchicine 0.6 MG tablet Take 0.6 mg by mouth 2 (two) times daily.  12  . furosemide (LASIX) 80 MG tablet Take 80 mg by mouth daily.  3  . HYDROcodone-acetaminophen (NORCO/VICODIN) 5-325 MG tablet Take 1 tablet by mouth every 4 (four) hours as needed for moderate pain. 30 tablet 0  . levothyroxine (SYNTHROID, LEVOTHROID) 50 MCG tablet Take 50 mcg by mouth every morning.  3  . magic mouthwash SOLN TAKE ONE TEASPOONFUL (5ML) BY MOUTH FOUR TIMES DAILY FOR SORES IN MOUTH FROM CHEMOTHEROPY  99  . Multiple Vitamin (MULTIVITAMIN WITH  MINERALS) TABS tablet Take 1 tablet by mouth daily.    . naproxen (NAPROSYN) 500 MG tablet Take 1 tablet (500 mg total) by mouth 2 (two) times daily.    . polyethylene glycol-electrolytes (TRILYTE) 420 g solution Take 4,000 mLs by mouth once. 4000 mL 0  . potassium chloride SA (K-DUR,KLOR-CON) 20 MEQ tablet TAKE TWO (2) TABLETS BY MOUTH DAILY. 60 tablet 1  . simvastatin (ZOCOR) 40 MG tablet Take 40 mg by mouth at bedtime.  3   No current facility-administered medications on file prior to visit.      PAST SURGICAL HISTORY Past Surgical History:  Procedure Laterality Date  . basal cell cancer removed from right leg    . COLONOSCOPY    . COLONOSCOPY N/A 05/09/2016   Procedure: COLONOSCOPY;  Surgeon: Rogene Houston, MD;  Location: [facility]ITE;  Service: Endoscopy;  Laterality: N/A;  1030  . CORONARY ARTERY BYPASS GRAFT    . HEMORRHOIDECTOMY WITH HEMORRHOID BANDING      FAMILY HISTORY: History reviewed. No pertinent family history. Non contributory, parents both deceased  SOCIAL HISTORY:  reports that he quit smoking about 4 years ago. His smoking use included Cigarettes. He has a 150.00 pack-year smoking history. He has never used smokeless tobacco. He reports that he does not drink alcohol or use drugs.  He is  [place].  He is retired.  He has been married x 50 years.  He has 2 children, son and daughter.  Daughter has a neurologic disease that has paralyzed her from neck down requiring trach and ventilator.  She lives with the patient and his wife.  This disease struck her at the age of 43.  Social History   Social History  . Marital status: Married    Spouse name: N/A  . Number of children: N/A  . Years of education: N/A   Social History Main Topics  . Smoking status: Former Smoker    Packs/day: 3.00    Years: 50.00    Types: Cigarettes    Quit date: 05/09/2012  . Smokeless tobacco: Never Used  . Alcohol use No  . Drug use: No  . Sexual activity: Not Currently   Other Topics Concern  . None   Social History Narrative  . None    PERFORMANCE STATUS: The patient's performance status is 1 - Symptomatic but completely ambulatory  PHYSICAL EXAM: Most Recent Vital Signs: Blood pressure (!) 166/80, pulse (!) 44, temperature 97.6 F (36.4 C), temperature source Oral, resp. rate 16, weight 206 lb 11.2 oz (93.8 kg), SpO2 93 %.    Physical Exam  Constitutional: He is oriented to person, place, and time and well-developed, well-nourished, and in no distress.  Pt was able to get on exam table without assistance.   HENT:  Head: Normocephalic and atraumatic.  Mouth/Throat: Oropharynx is clear and moist.  Eyes: Conjunctivae and EOM are normal. Pupils are equal, round, and reactive to light. No scleral icterus.  Neck: Normal range of motion. Neck supple.  Cardiovascular: Normal rate, regular rhythm and normal heart sounds.   Pulmonary/Chest: Effort normal and breath sounds normal.  Abdominal: Soft. Bowel sounds are normal. He exhibits no distension and no mass. There is no tenderness. There is no rebound and no guarding.  Musculoskeletal: Normal range of motion.  Lymphadenopathy:    He has no cervical adenopathy.  Neurological: He is alert and oriented to person, place, and time. Gait  normal.  Skin: Skin is warm and dry.  Psychiatric: Mood  normal.  Nursing note and vitals reviewed.   LABORATORY DATA:  No results found for this or any previous visit (from the past 48 hour(s)).   Results for RYELAN, KAZEE (MRN [id number]) as of 09/20/2016 13:28   Ref. Range 09/20/2016 13:46  Sodium Latest Ref Range: 135 - 145 mmol/L 137  Potassium Latest Ref Range: 3.5 - 5.1 mmol/L 3.5  Chloride Latest Ref Range: 101 - 111 mmol/L 99 (L)  CO2 Latest Ref Range: 22 - 32 mmol/L 30  Glucose Latest Ref Range: 65 - 99 mg/dL 94  BUN Latest Ref Range: 6 - 20 mg/dL 23 (H)  Creatinine Latest Ref Range: 0.61 - 1.24 mg/dL 1.41 (H)  Calcium Latest Ref Range: 8.9 - 10.3 mg/dL 9.5  Anion gap Latest Ref Range: 5 - 15  8  EGFR (African American) Latest Ref Range: >60 mL/min 56 (L)  EGFR (Non-African Amer.) Latest Ref Range: >60 mL/min 48 (L)  WBC Latest Ref Range: 4.0 - 10.5 K/uL 5.7  RBC Latest Ref Range: 4.22 - 5.81 MIL/uL 4.20 (L)  Hemoglobin Latest Ref Range: 13.0 - 17.0 g/dL 14.2  HCT Latest Ref Range: 39.0 - 52.0 % 42.3  MCV Latest Ref Range: 78.0 - 100.0 fL 100.7 (H)  MCH Latest Ref Range: 26.0 - 34.0 pg 33.8  MCHC Latest Ref Range: 30.0 - 36.0 g/dL 33.6  RDW Latest Ref Range: 11.5 - 15.5 % 14.7  Platelets Latest Ref Range: 150 - 400 K/uL 164  Neutrophils Latest Units: % 61  Lymphocytes Latest Units: % 26  Monocytes Relative Latest Units: % 10  Eosinophil Latest Units: % 3  Basophil Latest Units: % 0  NEUT# Latest Ref Range: 1.7 - 7.7 K/uL 3.5  Lymphocyte # Latest Ref Range: 0.7 - 4.0 K/uL 1.5  Monocyte # Latest Ref Range: 0.1 - 1.0 K/uL 0.5  Eosinophils Absolute Latest Ref Range: 0.0 - 0.7 K/uL 0.2  Basophils Absolute Latest Ref Range: 0.0 - 0.1 K/uL 0.0   RADIOGRAPHY: Ct Abdomen Wo Contrast  Result Date: 09/19/2016 CLINICAL DATA:  Patient with history of small cell lung carcinoma. EXAM: CT CHEST AND ABDOMEN WITHOUT CONTRAST TECHNIQUE: Multidetector CT imaging of the chest and  abdomen was performed following the standard protocol without intravenous contrast. COMPARISON:  CT chest 10/30/2015; bone scan 12/28/2015. CT CAP 04/11/2016. FINDINGS: CT CHEST FINDINGS WITHOUT CONTRAST Cardiovascular: Normal heart size. No pericardial effusion. Dense coronary arterial vascular calcifications. Port-A-Cath is present with tip terminating in the superior vena cava. Mediastinum/Nodes: No enlarged axillary, mediastinal or hilar lymphadenopathy. Esophagus is normal in appearance. Lungs/Pleura: Central airways are patent. Stable post radiation changes involving the paramediastinal left upper lobe and left hilum. Interval development of a 2 mm ground-glass nodule within the right lower lobe (image 113; series 3). No pleural effusion or pneumothorax. Musculoskeletal: Status post median sternotomy. Upper thoracic spine degenerative changes. No aggressive or acute appearing osseous lesions. CT ABDOMEN FINDINGS WITHOUT CONTRAST Hepatobiliary: Liver is normal in size and contour. Gallbladder is unremarkable. Pancreas: Mild fatty atrophy of the pancreatic parenchyma. Spleen: Unremarkable Adrenals/Urinary Tract: The adrenal glands are normal. 1.9 cm fluid attenuation exophytic lesion off the superior pole of the right kidney. No hydronephrosis. Stomach/Bowel: No abnormal bowel wall thickening or evidence for bowel obstruction. No free fluid or free intraperitoneal air. Vascular/Lymphatic: Peripheral calcified atherosclerotic plaque involving the abdominal aorta. No retroperitoneal lymphadenopathy. Other: None. Musculoskeletal: Lumbar spine degenerative changes. IMPRESSION: Stable appearance of the left upper lobe without evidence for local recurrence. No evidence for metastatic disease  in the chest or abdomen on noncontrast enhanced exam. Electronically Signed   By: Lovey Newcomer M.D.   On: 09/19/2016 15:08   Ct Chest Wo Contrast  Result Date: 09/19/2016 CLINICAL DATA:  Patient with history of small cell lung  carcinoma. EXAM: CT CHEST AND ABDOMEN WITHOUT CONTRAST TECHNIQUE: Multidetector CT imaging of the chest and abdomen was performed following the standard protocol without intravenous contrast. COMPARISON:  CT chest 10/30/2015; bone scan 12/28/2015. CT CAP 04/11/2016. FINDINGS: CT CHEST FINDINGS WITHOUT CONTRAST Cardiovascular: Normal heart size. No pericardial effusion. Dense coronary arterial vascular calcifications. Port-A-Cath is present with tip terminating in the superior vena cava. Mediastinum/Nodes: No enlarged axillary, mediastinal or hilar lymphadenopathy. Esophagus is normal in appearance. Lungs/Pleura: Central airways are patent. Stable post radiation changes involving the paramediastinal left upper lobe and left hilum. Interval development of a 2 mm ground-glass nodule within the right lower lobe (image 113; series 3). No pleural effusion or pneumothorax. Musculoskeletal: Status post median sternotomy. Upper thoracic spine degenerative changes. No aggressive or acute appearing osseous lesions. CT ABDOMEN FINDINGS WITHOUT CONTRAST Hepatobiliary: Liver is normal in size and contour. Gallbladder is unremarkable. Pancreas: Mild fatty atrophy of the pancreatic parenchyma. Spleen: Unremarkable Adrenals/Urinary Tract: The adrenal glands are normal. 1.9 cm fluid attenuation exophytic lesion off the superior pole of the right kidney. No hydronephrosis. Stomach/Bowel: No abnormal bowel wall thickening or evidence for bowel obstruction. No free fluid or free intraperitoneal air. Vascular/Lymphatic: Peripheral calcified atherosclerotic plaque involving the abdominal aorta. No retroperitoneal lymphadenopathy. Other: None. Musculoskeletal: Lumbar spine degenerative changes. IMPRESSION: Stable appearance of the left upper lobe without evidence for local recurrence. No evidence for metastatic disease in the chest or abdomen on noncontrast enhanced exam. Electronically Signed   By: Lovey Newcomer M.D.   On: 09/19/2016 15:08         PATHOLOGY:  N/A  ASSESSMENT/PLAN:   Small cell lung cancer, limited stage PCI Memory Loss Port a cath ARF  Left limited stage small cell lung cancer, treated with curative intent under the guidance of Dr. Tressie Stalker at [facility].  Treatment consisted of 6 cycles of Carboplatin/Etoposide (04/27/2013- 10/04/2013) with concurrent XRT initially to left mid-lung and mediastinum (05/18/2013- 06/29/2013), followed by prophylactic whole brain radiation (08/19/2013- 09/03/2013).  We discussed his memory issues. They do understand the benefits of WBRT in SCLC, they understand that his memory issues may be from his WBRT. From UP to Date : Whole brain radiation (WBRT) has both early-delayed and long-term effects on neurocognitive function in adults. Early-delayed effects typically impact learning and memory up to three months to one year following WBRT, whereas more significant long-term effects that broadly impact cognition can occur greater than one year after WBRT  CT imaging from 11/30 reviewed with the patient and his wife. Results are noted above. I am not sure why his creatinine the day of his imaging was elevated but I feel comfortable with the imaging results. Labs from today are available and reviewed with the patient and his wife. Results are noted above.   I have reviewed the NCCN guidelines pertaining to surveillance for limited stage small cell lung cancer.  NCCN guidelines recommends the following surveillance for small cell lung cancer (limited and extensive stage) (3.2017):  A. Oncology follow-up visits every 3-4 months during years 1-2, then every 6 months for years 3-5, and then annually.   B. At every visit: H&P, CT chest/liver/adrenal, blood work as indicated  C. New pulmonary nodule should initiate work-up for potential new primary.  D. Smoking cessation intervention  E. PET/CT is not recommended for routine follow-up.  He needs refills of pain medication.   We'll repeat scans in 3  months. If his kidney function isn't good enough for a CT with contrast, then I will try to order him a PET scan, as his wife is insistent that she does not feel comfortable with current noncontrast imaging.   He will return for a follow up in 3 months.   ORDERS PLACED FOR THIS ENCOUNTER: No orders of the defined types were placed in this encounter.  Orders Placed This Encounter  Procedures  . CT Abdomen Pelvis W Contrast    Standing Status:   Future    Standing Expiration Date:   09/20/2017    Order Specific Question:   If indicated for the ordered procedure, I authorize the administration of contrast media per Radiology protocol    Answer:   Yes    Order Specific Question:   Reason for Exam (SYMPTOM  OR DIAGNOSIS REQUIRED)    Answer:   restagin SCLC    Order Specific Question:   Preferred imaging location?    Answer:   [facility]  . CT Chest W Contrast    Standing Status:   Future    Standing Expiration Date:   09/20/2017    Order Specific Question:   If indicated for the ordered procedure, I authorize the administration of contrast media per Radiology protocol    Answer:   Yes    Order Specific Question:   Reason for Exam (SYMPTOM  OR DIAGNOSIS REQUIRED)    Answer:   restaging SCLC    Order Specific Question:   Preferred imaging location?    Answer:   [facility]   Meds ordered this encounter  Medications  . HYDROcodone-acetaminophen (NORCO/VICODIN) 5-325 MG tablet    Sig: Take 1 tablet by mouth every 4 (four) hours as needed for moderate pain.    Dispense:  30 tablet    Refill:  0     All questions were answered. The patient knows to call the clinic with any problems, questions or concerns. We can certainly see the patient much sooner if necessary.  This document serves as a record of services personally performed by Ancil Linsey, MD. It was created on her behalf by Martinique Casey, a trained medical scribe. The creation of this record is based on the  scribe's personal observations and the provider's statements to them. This document has been checked and approved by the attending provider.  This note is electronically signed [id number] Cyril Mourning, MD  09/20/2016 1:31 PM

## 2016-09-20 NOTE — Patient Instructions (Signed)
[facility] at [facility] Discharge Instructions  RECOMMENDATIONS MADE BY THE CONSULTANT AND ANY TEST RESULTS WILL BE SENT TO YOUR REFERRING PHYSICIAN.  You saw Dr.Penland today. See Amy at checkout for appointments.  Thank you for choosing [facility] at [facility] to provide your oncology and hematology care.  To afford each patient quality time with our provider, please arrive at least 15 minutes before your scheduled appointment time.   Beginning January 23rd 2017 lab work for the Cancer Center will be done in the  Main lab at [facility] on 1st floor. If you have a lab appointment with the Cancer Center please come in thru the  Main Entrance and check in at the main information desk  You need to re-schedule your appointment should you arrive 10 or more minutes late.  We strive to give you quality time with our providers, and arriving late affects you and other patients whose appointments are after yours.  Also, if you no show three or more times for appointments you may be dismissed from the clinic at the providers discretion.     Again, thank you for choosing [facility].  Our hope is that these requests will decrease the amount of time that you wait before being seen by our physicians.       _____________________________________________________________  Should you have questions after your visit to [facility], please contact our office at [phone] between the hours of 8:30 a.m. and 4:30 p.m.  Voicemails left after 4:30 p.m. will not be returned until the following business day.  For prescription refill requests, have your pharmacy contact our office.         Resources For Cancer Patients and their Caregivers ? American Cancer Society: Can assist with transportation, wigs, general needs, runs Look Good Feel Better.        1-888-227-6333 ? Cancer Care: Provides financial assistance, online support  groups, medication/co-pay assistance.  1-800-813-HOPE (4673) ? [facility] Assists [place] cancer patients and their families through emotional , educational and financial support.  336-427-4357 ? [place] DSS Where to apply for food stamps, Medicaid and utility assistance. 336-342-1394 ? RCATS: Transportation to medical appointments. 336-347-2287 ? Social Security Administration: May apply for disability if have a Stage IV cancer. 336-342-7796 1-800-772-1213 ? [place] Aging, Disability and Transit Services: Assists with nutrition, care and transit needs. 336-349-2343  Cancer Center Support Programs: @10RELATIVEDAYS@ > Cancer Support Group  2nd Tuesday of the month 1pm-2pm, Journey Room  > Creative Journey  3rd Tuesday of the month 1130am-1pm, Journey Room  > Look Good Feel Better  1st Wednesday of the month 10am-12 noon, Journey Room (Call American Cancer Society to register 1-800-395-5775)    

## 2016-10-24 ENCOUNTER — Telehealth (HOSPITAL_COMMUNITY): Payer: Self-pay | Admitting: *Deleted

## 2016-10-25 NOTE — Telephone Encounter (Signed)
Per Kirby Crigler, PA patient can have dental extraction.  Spoke with patient via telephone and advised as such. Patient will proceed with dental extraction.

## 2016-11-05 ENCOUNTER — Other Ambulatory Visit (HOSPITAL_COMMUNITY): Payer: Self-pay | Admitting: Oncology

## 2016-11-05 DIAGNOSIS — E876 Hypokalemia: Secondary | ICD-10-CM

## 2016-11-15 ENCOUNTER — Encounter (HOSPITAL_COMMUNITY): Payer: PPO | Attending: Oncology

## 2016-11-15 VITALS — BP 151/73 | HR 51 | Temp 97.5°F | Resp 18

## 2016-11-15 DIAGNOSIS — C3492 Malignant neoplasm of unspecified part of left bronchus or lung: Secondary | ICD-10-CM | POA: Diagnosis not present

## 2016-11-15 DIAGNOSIS — Z95828 Presence of other vascular implants and grafts: Secondary | ICD-10-CM

## 2016-11-15 DIAGNOSIS — Z23 Encounter for immunization: Secondary | ICD-10-CM | POA: Insufficient documentation

## 2016-11-15 LAB — COMPREHENSIVE METABOLIC PANEL
ALBUMIN: 4 g/dL (ref 3.5–5.0)
ALK PHOS: 56 U/L (ref 38–126)
ALT: 19 U/L (ref 17–63)
ANION GAP: 9 (ref 5–15)
AST: 27 U/L (ref 15–41)
BILIRUBIN TOTAL: 0.6 mg/dL (ref 0.3–1.2)
BUN: 23 mg/dL — ABNORMAL HIGH (ref 6–20)
CALCIUM: 9.2 mg/dL (ref 8.9–10.3)
CO2: 30 mmol/L (ref 22–32)
Chloride: 100 mmol/L — ABNORMAL LOW (ref 101–111)
Creatinine, Ser: 1.47 mg/dL — ABNORMAL HIGH (ref 0.61–1.24)
GFR calc Af Amer: 53 mL/min — ABNORMAL LOW (ref >60)
GFR calc non Af Amer: 46 mL/min — ABNORMAL LOW (ref >60)
GLUCOSE: 85 mg/dL (ref 65–99)
POTASSIUM: 3.4 mmol/L — AB (ref 3.5–5.1)
SODIUM: 139 mmol/L (ref 135–145)
TOTAL PROTEIN: 7 g/dL (ref 6.5–8.1)

## 2016-11-15 LAB — CBC WITH DIFFERENTIAL/PLATELET
BASOS PCT: 0 %
Basophils Absolute: 0 10*3/uL (ref 0.0–0.1)
EOS ABS: 0.2 10*3/uL (ref 0.0–0.7)
Eosinophils Relative: 3 %
HEMATOCRIT: 41.4 % (ref 39.0–52.0)
HEMOGLOBIN: 14.1 g/dL (ref 13.0–17.0)
Lymphocytes Relative: 26 %
Lymphs Abs: 1.4 10*3/uL (ref 0.7–4.0)
MCH: 33.8 pg (ref 26.0–34.0)
MCHC: 34.1 g/dL (ref 30.0–36.0)
MCV: 99.3 fL (ref 78.0–100.0)
Monocytes Absolute: 0.3 10*3/uL (ref 0.1–1.0)
Monocytes Relative: 6 %
NEUTROS ABS: 3.5 10*3/uL (ref 1.7–7.7)
NEUTROS PCT: 65 %
Platelets: 154 10*3/uL (ref 150–400)
RBC: 4.17 MIL/uL — ABNORMAL LOW (ref 4.22–5.81)
RDW: 14.5 % (ref 11.5–15.5)
WBC: 5.4 10*3/uL (ref 4.0–10.5)

## 2016-11-15 MED ORDER — SODIUM CHLORIDE 0.9% FLUSH
10.0000 mL | Freq: Once | INTRAVENOUS | Status: AC
  Administered 2016-11-15: 10 mL via INTRAVENOUS

## 2016-11-15 MED ORDER — HEPARIN SOD (PORK) LOCK FLUSH 100 UNIT/ML IV SOLN
500.0000 [IU] | Freq: Once | INTRAVENOUS | Status: AC
  Administered 2016-11-15: 500 [IU] via INTRAVENOUS
  Filled 2016-11-15: qty 5

## 2016-11-15 NOTE — Patient Instructions (Signed)
[place] at [facility] Discharge Instructions  RECOMMENDATIONS MADE BY THE CONSULTANT AND ANY TEST RESULTS WILL BE SENT TO YOUR REFERRING PHYSICIAN.  Port flush with lab work today. Return as scheduled.  Thank you for choosing [place] at [facility] to provide your oncology and hematology care.  To afford each patient quality time with our provider, please arrive at least 15 minutes before your scheduled appointment time.    If you have a lab appointment with the [place] please come in thru the  Main Entrance and check in at the main information desk  You need to re-schedule your appointment should you arrive 10 or more minutes late.  We strive to give you quality time with our providers, and arriving late affects you and other patients whose appointments are after yours.  Also, if you no show three or more times for appointments you may be dismissed from the clinic at the providers discretion.     Again, thank you for choosing [facility].  Our hope is that these requests will decrease the amount of time that you wait before being seen by our physicians.       _____________________________________________________________  Should you have questions after your visit to [facility], please contact our office at [phone] between the hours of 8:30 a.m. and 4:30 p.m.  Voicemails left after 4:30 p.m. will not be returned until the following business day.  For prescription refill requests, have your pharmacy contact our office.       Resources For Cancer Patients and their Caregivers ? American Cancer Society: Can assist with transportation, wigs, general needs, runs Look Good Feel Better.        930 783 8922 ? Cancer Care: Provides financial assistance, online support groups, medication/co-pay assistance.  1-800-813-HOPE 618-397-9598) ? [place] Assists [place] cancer patients  and their families through emotional , educational and financial support.  204 558 2331 ? [place] DSS Where to apply for food stamps, Medicaid and utility assistance. 870-218-7449 ? RCATS: Transportation to medical appointments. 862-086-8690 ? Social Security Administration: May apply for disability if have a Stage IV cancer. 228-665-5837 640 376 1672 ? [employer], Disability and Transit Services: Assists with nutrition, care and transit needs. [place] Support Programs: '@10RELATIVEDAYS'$ @ > Cancer Support Group  2nd Tuesday of the month 1pm-2pm, Journey Room  > Creative Journey  3rd Tuesday of the month 1130am-1pm, Journey Room  > Look Good Feel Better  1st Wednesday of the month 10am-12 noon, Journey Room (Call [place] to register [phone])

## 2016-11-15 NOTE — Progress Notes (Signed)
Manuel Rivera presented for Portacath access and flush. Proper placement of portacath confirmed by CXR. Portacath located left chest wall accessed with  H 20 needle. Good blood return present. Portacath flushed with 56m NS and 500U/556mHeparin and needle removed intact. Procedure without incident. Patient tolerated procedure well.

## 2016-11-17 ENCOUNTER — Encounter (HOSPITAL_COMMUNITY): Payer: Self-pay | Admitting: Hematology & Oncology

## 2016-11-22 ENCOUNTER — Ambulatory Visit (HOSPITAL_COMMUNITY): Payer: PPO

## 2016-11-25 ENCOUNTER — Ambulatory Visit (HOSPITAL_COMMUNITY): Payer: PPO | Admitting: Adult Health

## 2016-11-25 ENCOUNTER — Ambulatory Visit (HOSPITAL_COMMUNITY): Payer: PPO | Admitting: Hematology & Oncology

## 2016-12-02 ENCOUNTER — Ambulatory Visit (HOSPITAL_COMMUNITY): Payer: PPO | Admitting: Adult Health

## 2016-12-03 ENCOUNTER — Ambulatory Visit (HOSPITAL_COMMUNITY)
Admission: RE | Admit: 2016-12-03 | Discharge: 2016-12-03 | Disposition: A | Discharge status: Home / Self Care | Payer: PPO | Source: Ambulatory Visit | Attending: Hematology & Oncology | Admitting: Hematology & Oncology

## 2016-12-03 DIAGNOSIS — I700 Atherosclerosis of aorta: Secondary | ICD-10-CM | POA: Insufficient documentation

## 2016-12-03 DIAGNOSIS — C3492 Malignant neoplasm of unspecified part of left bronchus or lung: Secondary | ICD-10-CM | POA: Insufficient documentation

## 2016-12-03 DIAGNOSIS — C3490 Malignant neoplasm of unspecified part of unspecified bronchus or lung: Secondary | ICD-10-CM | POA: Diagnosis not present

## 2016-12-03 DIAGNOSIS — Z9889 Other specified postprocedural states: Secondary | ICD-10-CM | POA: Diagnosis not present

## 2016-12-03 DIAGNOSIS — Y842 Radiological procedure and radiotherapy as the cause of abnormal reaction of the patient, or of later complication, without mention of misadventure at the time of the procedure: Secondary | ICD-10-CM | POA: Diagnosis not present

## 2016-12-03 MED ORDER — IOPAMIDOL (ISOVUE-300) INJECTION 61%
80.0000 mL | Freq: Once | INTRAVENOUS | Status: AC | PRN
  Administered 2016-12-03: 80 mL via INTRAVENOUS

## 2016-12-04 ENCOUNTER — Encounter (HOSPITAL_COMMUNITY): Payer: Self-pay | Admitting: Adult Health

## 2016-12-04 ENCOUNTER — Encounter (HOSPITAL_COMMUNITY): Payer: PPO | Attending: Oncology | Admitting: Adult Health

## 2016-12-04 DIAGNOSIS — Z85118 Personal history of other malignant neoplasm of bronchus and lung: Secondary | ICD-10-CM | POA: Diagnosis not present

## 2016-12-04 DIAGNOSIS — M1990 Unspecified osteoarthritis, unspecified site: Secondary | ICD-10-CM

## 2016-12-04 DIAGNOSIS — M799 Soft tissue disorder, unspecified: Secondary | ICD-10-CM | POA: Diagnosis not present

## 2016-12-04 DIAGNOSIS — C3492 Malignant neoplasm of unspecified part of left bronchus or lung: Secondary | ICD-10-CM

## 2016-12-04 DIAGNOSIS — Z95828 Presence of other vascular implants and grafts: Secondary | ICD-10-CM

## 2016-12-04 MED ORDER — HYDROCODONE-ACETAMINOPHEN 5-325 MG PO TABS: 1.0000 | ORAL_TABLET | ORAL | 0 refills | Status: DC | PRN

## 2016-12-04 NOTE — Progress Notes (Addendum)
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      04/07/2013 PET scan    1. A large FDG-avid left perihilar mass is consistent with the patient's known primary lung cancer. There is adjacent interlobular septal thickening (possibly lymphangitic tumor spread) and an adjacent FDG avid 1.9 cm left upper lobe nodule.  2. A large FDG-avid AP window soft tissue mass may represent lymphatic metastasis or possibly contiguous mediastinal tumor invasion. No supraclavicular or right hilar FDG-avid adenopathy.  3. Scattered, non-FDG-avid left upper lobe groundglass heterogeneous and nodular opacities are non-specific and may be infectious, inflammatory or possibly malignant; attention on follow-up.  4. No definite evidence of FDG-avid metastatic disease in the neck, abdomen or pelvis.      04/27/2013 - 10/04/2013 Chemotherapy    Carboplatin/etoposide 6 cycles with concomitant radiation initially, by Dr. Tressie Rivera at [facility].       05/18/2013 - 06/29/2013 Radiation Therapy    6000 cGy in 30 fractions to left mid lung and mediastinum, Dr. Orlene Rivera      08/19/2013 - 09/03/2013 Radiation Therapy    Whole brain prophylactic cranial radiation, 3000 cGy in 12 fractions.  Dr. Orlene Rivera.      07/12/2014 Adverse Reaction    Mild memory loss noted by patient and wife times several months. Chronic fatigue that has not remitted.      01/16/2015 Imaging    CT chest-no evidence of disease      04/19/2015 Imaging     CT chest- no evidence of disease      07/28/2015 Imaging    CT of chest-stable radiation changes in the left perihilar region. No evidence of local recurrence or metastatic disease. Emphysema, diffuse atherosclerosis, upper thoracic spine sclerosis, and chronic sternal nonunion noted.      07/28/2015 Imaging    CT chest-  No evidence of disease.      10/30/2015 Imaging    CT Chest- postsurgical and postradiation changes in the left upper lobe without evidence of local recurrence. No evidence of metastatic disease in the thorax.      12/28/2015 Imaging    Bone scan- no bone scan findings to suggest metastatic malignancy. Areas of increased uptake described above are most compatible with degenerative changes.      02/14/2016 Imaging    MRI brain- No evidence of intracranial metastases.      09/19/2016 Imaging    CT CAP- Stable appearance of the left upper lobe without evidence for local recurrence.  No evidence for metastatic disease in the chest or abdomen on noncontrast enhanced exam.      12/03/2016 Imaging    CT CAP-  Chest Impression:  1. Postsurgical change and radiation change in the LEFT upper lobe. No evidence of local recurrence. 2. No evidence of thoracic metastasis.  Abdomen / Pelvis Impression:  1. No evidence of metastatic disease in the abdomen or pelvis. 2.  Atherosclerotic calcification of the aorta.        INTERVAL HISTORY:  Manuel Rivera presents for  routine follow-up for history of small cell lung cancer; he is accompanied by his wife today.  He recently had CT chest/abd/pelvis for restaging and is here today to review those results.   He has been feeling well.  His energy levels and appetite are good. Denies any cough or hemoptysis. He has chronic dyspnea on exertion, but this does not interfere with ADLs. He continues to walk about 2.5 miles/day for exercise. He struggles with memory loss since completing whole brain radiation several years ago; denies  any dizziness, N&V, headaches, or ataxia/falls.   He has chronic arthritis; this is largely unchanged.  He takes Norco tab about once per day; "but not every day."  He is requesting a refill today; we have been providing him prescriptions for his pain medication instead of his PCP.  He sees his PCP, Dr. Nadara Rivera, regularly.  He received his flu vaccine this season; he has received the pneumonia and shingles vaccines in the past. He underwent colonoscopy about 2 years ago; his PCP performs prostate cancer screening with PSA testing.    He tells me he needs to have some dental procedures and oral surgery.  His dentist will not perform any procedures without clearance from our office given his history of cancer. Manuel Rivera is requesting a letter to be given to his dentist today.    He has a "lump" on his left upper thigh that his wife would like for me to take a look at today.  This mass has been there for several years, "but I feel like it has gotten bigger recently."      REVIEW OF SYSTEMS:  Review of Systems  Constitutional: Negative.  Negative for appetite change, chills, fatigue and fever.  HENT:  Negative.   Eyes: Negative.   Respiratory: Positive for shortness of breath (DOE (chronic) ). Negative for cough.   Cardiovascular: Negative.  Negative for chest pain, leg swelling and palpitations.  Gastrointestinal: Negative.  Negative for blood in stool, constipation, diarrhea, nausea and vomiting.  Endocrine: Negative.   Genitourinary: Negative.  Negative for dysuria and hematuria.   Musculoskeletal: Positive for arthralgias (chronic ). Negative for gait problem.  Skin: Negative.   Neurological: Negative.  Negative for dizziness, gait problem and headaches.  Hematological: Negative.      PAST MEDICAL/SURGICAL HISTORY:  Past Medical History:  Diagnosis Date  . Anxiety   . Arthritis   . Cancer [facility]) 2014   Small Cell Lung Cancer  . COPD (chronic obstructive pulmonary disease) ([place])   .  Coronary atherosclerosis of artery bypass graft   . Hyperlipidemia   . Hypothyroidism   . Lipoma   . Memory loss   . Panic attack   . Peripheral vascular disease, unspecified   . Small cell lung cancer ([place]) 06/19/2016  . Undiagnosed cardiac murmurs    Past Surgical History:  Procedure Laterality Date  . basal cell cancer removed from right leg    . COLONOSCOPY    . COLONOSCOPY N/A 05/09/2016   Procedure: COLONOSCOPY;  Surgeon: Rogene Houston, MD;  Location: [facility] ENDO SUITE;  Service: Endoscopy;  Laterality: N/A;  1030  . CORONARY ARTERY BYPASS GRAFT    . HEMORRHOIDECTOMY WITH HEMORRHOID BANDING       SOCIAL HISTORY:  Social History   Social History  . Marital status: Married    Spouse name: N/A  . Number of children: N/A  . Years of education: N/A   Occupational History  . Not on file.  Social History Main Topics  . Smoking status: Former Smoker    Packs/day: 3.00    Years: 50.00    Types: Cigarettes    Quit date: 05/09/2012  . Smokeless tobacco: Never Used  . Alcohol use No  . Drug use: No  . Sexual activity: Not Currently   Other Topics Concern  . Not on file   Social History Narrative  . No narrative on file    FAMILY HISTORY:  History reviewed. No pertinent family history.  CURRENT MEDICATIONS:  Outpatient Encounter Prescriptions as of 12/04/2016  Medication Sig Note  . ADVAIR DISKUS 100-50 MCG/DOSE AEPB Inhale 1 puff into the lungs 2 (two) times daily.   Marland Kitchen albuterol (PROVENTIL HFA;VENTOLIN HFA) 108 (90 Base) MCG/ACT inhaler Inhale 1-2 puffs into the lungs every 6 (six) hours as needed for wheezing or shortness of breath.   . allopurinol (ZYLOPRIM) 300 MG tablet Take 300 mg by mouth daily.   Marland Kitchen ALPRAZolam (XANAX) 0.5 MG tablet Take 0.5 mg by mouth at bedtime.   . beta carotene 25000 UNIT capsule Take by mouth. 06/19/2016: Received from: [place]: Take 1 capsule by mouth daily.  . calcium carbonate (OSCAL) 1500 (600 Ca) MG  TABS tablet Take 600 mg of elemental calcium by mouth 2 (two) times daily with a meal.   . colchicine 0.6 MG tablet Take 0.6 mg by mouth 2 (two) times daily.   . furosemide (LASIX) 80 MG tablet Take 80 mg by mouth daily.   Marland Kitchen HYDROcodone-acetaminophen (NORCO/VICODIN) 5-325 MG tablet Take 1 tablet by mouth every 4 (four) hours as needed for moderate pain.   Marland Kitchen levothyroxine (SYNTHROID, LEVOTHROID) 50 MCG tablet Take 50 mcg by mouth every morning.   . magic mouthwash SOLN TAKE ONE TEASPOONFUL (5ML) BY MOUTH FOUR TIMES DAILY FOR SORES IN MOUTH FROM CHEMOTHEROPY   . Multiple Vitamin (MULTIVITAMIN WITH MINERALS) TABS tablet Take 1 tablet by mouth daily.   . naproxen (NAPROSYN) 500 MG tablet Take 1 tablet (500 mg total) by mouth 2 (two) times daily.   . potassium chloride SA (K-DUR,KLOR-CON) 20 MEQ tablet TAKE TWO (2) TABLETS BY MOUTH DAILY.   . simvastatin (ZOCOR) 40 MG tablet Take 40 mg by mouth at bedtime.   . [DISCONTINUED] HYDROcodone-acetaminophen (NORCO/VICODIN) 5-325 MG tablet Take 1 tablet by mouth every 4 (four) hours as needed for moderate pain.   . [DISCONTINUED] polyethylene glycol-electrolytes (TRILYTE) 420 g solution Take 4,000 mLs by mouth once. (Patient not taking: Reported on 12/04/2016)    No facility-administered encounter medications on file as of 12/04/2016.     ALLERGIES:  No Known Allergies   PHYSICAL EXAM:  ECOG Performance status: 1 - Symptomatic, but largely independent.   Vitals:   12/04/16 1343  BP: (!) 183/55  Pulse: (!) 50  Resp: 16  Temp: 97.5 F (36.4 C)   Filed Weights   12/04/16 1343  Weight: 199 lb (90.3 kg)    Physical Exam  Constitutional: He is oriented to person, place, and time and well-developed, well-nourished, and in no distress.  HENT:  Head: Normocephalic.  Mouth/Throat: Oropharynx is clear and moist. No oropharyngeal exudate.  Eyes: Conjunctivae are normal. Pupils are equal, round, and reactive to light. No scleral icterus.  Neck: Normal  range of motion. Neck supple.  Cardiovascular: Normal rate, regular rhythm and normal heart sounds.   Pulmonary/Chest: Effort normal and breath sounds normal. No respiratory distress.  Abdominal: Soft. Bowel sounds are normal. There is no tenderness.  Musculoskeletal: He exhibits no edema.       Legs: Lymphadenopathy:    He has no cervical adenopathy.       Right: No supraclavicular adenopathy present.       Left: No supraclavicular adenopathy present.  Neurological: He is alert and oriented to person, place, and time. No cranial nerve deficit. Gait normal.  Skin: Skin is warm and dry. No rash noted.  Psychiatric: Mood, affect and judgment normal.  Nursing note and vitals reviewed.    LABORATORY DATA:  I have reviewed the labs as listed.  CBC    Component Value Date/Time   WBC 5.4 11/15/2016 1201   RBC 4.17 (L) 11/15/2016 1201   HGB 14.1 11/15/2016 1201   HCT 41.4 11/15/2016 1201   PLT 154 11/15/2016 1201   MCV 99.3 11/15/2016 1201   MCH 33.8 11/15/2016 1201   MCHC 34.1 11/15/2016 1201   RDW 14.5 11/15/2016 1201   LYMPHSABS 1.4 11/15/2016 1201   MONOABS 0.3 11/15/2016 1201   EOSABS 0.2 11/15/2016 1201   BASOSABS 0.0 11/15/2016 1201   CMP Latest Ref Rng & Units 11/15/2016 09/20/2016 08/29/2016  Glucose 65 - 99 mg/dL 85 94 83  BUN 6 - 20 mg/dL 23(H) 23(H) 31(H)  Creatinine 0.61 - 1.24 mg/dL 1.47(H) 1.41(H) 2.44(H)  Sodium 135 - 145 mmol/L 139 137 137  Potassium 3.5 - 5.1 mmol/L 3.4(L) 3.5 3.7  Chloride 101 - 111 mmol/L 100(L) 99(L) 99(L)  CO2 22 - 32 mmol/L '30 30 29  '$ Calcium 8.9 - 10.3 mg/dL 9.2 9.5 10.3  Total Protein 6.5 - 8.1 g/dL 7.0 - 7.8  Total Bilirubin 0.3 - 1.2 mg/dL 0.6 - 0.7  Alkaline Phos 38 - 126 U/L 56 - 61  AST 15 - 41 U/L 27 - 31  ALT 17 - 63 U/L 19 - 27    PENDING LABS:    DIAGNOSTIC IMAGING:  CT chest/abd/pelvis: 12/03/16     PATHOLOGY:     ASSESSMENT & PLAN:   Limited stage SCLC:  -Most recent CT chest/abd/pelvis with no evidence of  malignancy. Results reviewed with patient and wife. He is now 4+ years out from his initial diagnosis and treatment without evidence of recurrence, which is very favorable.  -Based on NCCN Guidelines, patient does not need CT scans with contrast. I explained the rationale for this to the patient and his wife, who was initially concerned, but voiced understanding for this reasoning and agrees.   -We will proceed with repeat CT chest/abd without contrast in 6 months. He understands that after this upcoming scan, we will proceed with annual imaging and visits.  He and his wife agree to this plan.       Chronic arthritis:  -Periodically, he uses 1 tab Norco; we have been providing his prescriptions for his pain medication rather than his PCP.  We are happy to continue to manage.  Advised patient not to request pain medication prescriptions from other providers, which he agreed.  -[place] Controlled Substance Registry reviewed and refill prescription is appropriate.  -Prescription provided today for: Hydrocodone/APAP 5/325 1 tab po Q4Hprn, #30, no refills.   Left upper thigh soft tissue mass:  -Reportedly present for years.  This does not appear suspicious for malignancy on physical exam; most consistent with benign lipoma. It is beginning to cause him occasional discomfort.  Encouraged patient to follow-up with PCP or dermatology for additional intervention, if needed.   Health maintenance:  -He is up-to-date with his vaccinations. Colonoscopy is reportedly  up-to-date. PCP manages prostate cancer screening.  Recommended continued follow-up with PCP, as well as healthy diet and exercise.  *Letter drafted to his dentist stating that from our standpoint, there is no oncologic reason that the patient cannot undergo oral surgery/dental extractions. Copy of letter given to patient for them to give to dentist.     Dispo:  -CT chest/abd in 6 months for continued surveillance. -Return to cancer center after  scans for continued follow-up.    All questions were answered to patient's stated satisfaction. Encouraged patient to call with any new concerns or questions before his next visit to the cancer center and we can certain see him sooner, if needed.      Manuel Craze, NP [place] [phone]

## 2016-12-04 NOTE — Progress Notes (Signed)
    [place], [place] [address]   To Whom It May Concern:   Mr. HILLARD GOODWINE, Date of Birth 06/26/1946, has completed treatment for lung cancer. He is currently without evidence of disease and it is medically safe, from our standpoint, to undergo dental extractions or any necessary oral surgeries.    You are welcome to contact our office with any additional questions or concerns.   Hulda Marin, NP [place] [phone]

## 2016-12-04 NOTE — Patient Instructions (Addendum)
[place] at [facility] Discharge Instructions  RECOMMENDATIONS MADE BY THE CONSULTANT AND ANY TEST RESULTS WILL BE SENT TO YOUR REFERRING PHYSICIAN.  You saw Mike Craze, NP, today. Return in 6 months for follow up and CT. See Amy at checkout for appointments.  Thank you for choosing [place] at [facility] to provide your oncology and hematology care.  To afford each patient quality time with our provider, please arrive at least 15 minutes before your scheduled appointment time.    If you have a lab appointment with the [place] please come in thru the  Main Entrance and check in at the main information desk  You need to re-schedule your appointment should you arrive 10 or more minutes late.  We strive to give you quality time with our providers, and arriving late affects you and other patients whose appointments are after yours.  Also, if you no show three or more times for appointments you may be dismissed from the clinic at the providers discretion.     Again, thank you for choosing [facility].  Our hope is that these requests will decrease the amount of time that you wait before being seen by our physicians.       _____________________________________________________________  Should you have questions after your visit to [facility], please contact our office at [phone] between the hours of 8:30 a.m. and 4:30 p.m.  Voicemails left after 4:30 p.m. will not be returned until the following business day.  For prescription refill requests, have your pharmacy contact our office.       Resources For Cancer Patients and their Caregivers ? American Cancer Society: Can assist with transportation, wigs, general needs, runs Look Good Feel Better.        302-414-3477 ? Cancer Care: Provides financial assistance, online support groups, medication/co-pay assistance.  1-800-813-HOPE 551 854 7631) ? [place] Assists [place] cancer patients and their families through emotional , educational and financial support.  (510)185-7741 ? [place] DSS Where to apply for food stamps, Medicaid and utility assistance. (918)186-5199 ? RCATS: Transportation to medical appointments. 564 784 8511 ? Social Security Administration: May apply for disability if have a Stage IV cancer. 612-503-7018 (936)212-7266 ? [employer], Disability and Transit Services: Assists with nutrition, care and transit needs. [place] Support Programs: '@10RELATIVEDAYS'$ @ > Cancer Support Group  2nd Tuesday of the month 1pm-2pm, Journey Room  > Creative Journey  3rd Tuesday of the month 1130am-1pm, Journey Room  > Look Good Feel Better  1st Wednesday of the month 10am-12 noon, Journey Room (Call [place] to register [phone])

## 2016-12-05 DIAGNOSIS — L110 Acquired keratosis follicularis: Secondary | ICD-10-CM | POA: Diagnosis not present

## 2016-12-05 DIAGNOSIS — L609 Nail disorder, unspecified: Secondary | ICD-10-CM | POA: Diagnosis not present

## 2016-12-05 DIAGNOSIS — I739 Peripheral vascular disease, unspecified: Secondary | ICD-10-CM | POA: Diagnosis not present

## 2016-12-07 ENCOUNTER — Other Ambulatory Visit (HOSPITAL_COMMUNITY): Payer: Self-pay | Admitting: Oncology

## 2016-12-07 DIAGNOSIS — E876 Hypokalemia: Secondary | ICD-10-CM

## 2016-12-30 DIAGNOSIS — J440 Chronic obstructive pulmonary disease with acute lower respiratory infection: Secondary | ICD-10-CM | POA: Diagnosis not present

## 2016-12-30 DIAGNOSIS — J42 Unspecified chronic bronchitis: Secondary | ICD-10-CM | POA: Diagnosis not present

## 2016-12-30 DIAGNOSIS — Z6829 Body mass index (BMI) 29.0-29.9, adult: Secondary | ICD-10-CM | POA: Diagnosis not present

## 2017-01-10 ENCOUNTER — Encounter (HOSPITAL_COMMUNITY): Payer: PPO | Attending: Oncology

## 2017-01-10 VITALS — BP 151/71 | HR 62 | Temp 97.7°F | Resp 22

## 2017-01-10 DIAGNOSIS — Z85118 Personal history of other malignant neoplasm of bronchus and lung: Secondary | ICD-10-CM

## 2017-01-10 DIAGNOSIS — Z452 Encounter for adjustment and management of vascular access device: Secondary | ICD-10-CM

## 2017-01-10 DIAGNOSIS — Z23 Encounter for immunization: Secondary | ICD-10-CM | POA: Insufficient documentation

## 2017-01-10 DIAGNOSIS — C3492 Malignant neoplasm of unspecified part of left bronchus or lung: Secondary | ICD-10-CM | POA: Insufficient documentation

## 2017-01-10 DIAGNOSIS — Z95828 Presence of other vascular implants and grafts: Secondary | ICD-10-CM

## 2017-01-10 MED ORDER — HEPARIN SOD (PORK) LOCK FLUSH 100 UNIT/ML IV SOLN
500.0000 [IU] | Freq: Once | INTRAVENOUS | Status: AC
  Administered 2017-01-10: 500 [IU] via INTRAVENOUS

## 2017-01-10 MED ORDER — SODIUM CHLORIDE 0.9% FLUSH
10.0000 mL | Freq: Once | INTRAVENOUS | Status: AC
  Administered 2017-01-10: 10 mL via INTRAVENOUS

## 2017-01-10 MED ORDER — HEPARIN SOD (PORK) LOCK FLUSH 100 UNIT/ML IV SOLN
INTRAVENOUS | Status: AC
  Filled 2017-01-10: qty 5

## 2017-01-10 NOTE — Patient Instructions (Signed)
[facility] at [facility] Discharge Instructions  RECOMMENDATIONS MADE BY THE CONSULTANT AND ANY TEST RESULTS WILL BE SENT TO YOUR REFERRING PHYSICIAN.  Port flush today as ordered. Return as scheduled.  Thank you for choosing [facility] at [facility] to provide your oncology and hematology care.  To afford each patient quality time with our provider, please arrive at least 15 minutes before your scheduled appointment time.    If you have a lab appointment with the Cancer Center please come in thru the  Main Entrance and check in at the main information desk  You need to re-schedule your appointment should you arrive 10 or more minutes late.  We strive to give you quality time with our providers, and arriving late affects you and other patients whose appointments are after yours.  Also, if you no show three or more times for appointments you may be dismissed from the clinic at the providers discretion.     Again, thank you for choosing [facility].  Our hope is that these requests will decrease the amount of time that you wait before being seen by our physicians.       _____________________________________________________________  Should you have questions after your visit to [facility], please contact our office at [phone] between the hours of 8:30 a.m. and 4:30 p.m.  Voicemails left after 4:30 p.m. will not be returned until the following business day.  For prescription refill requests, have your pharmacy contact our office.       Resources For Cancer Patients and their Caregivers ? American Cancer Society: Can assist with transportation, wigs, general needs, runs Look Good Feel Better.        1-888-227-6333 ? Cancer Care: Provides financial assistance, online support groups, medication/co-pay assistance.  1-800-813-HOPE (4673) ? [facility] Assists [place] cancer patients  and their families through emotional , educational and financial support.  336-427-4357 ? [place] DSS Where to apply for food stamps, Medicaid and utility assistance. 336-342-1394 ? [employer]: Transportation to medical appointments. 336-347-2287 ? Social Security Administration: May apply for disability if have a Stage IV cancer. 336-342-7796 1-800-772-1213 ? [place] Aging, Disability and Transit Services: Assists with nutrition, care and transit needs. 336-349-2343  Cancer Center Support Programs: @10RELATIVEDAYS@ > Cancer Support Group  2nd Tuesday of the month 1pm-2pm, Journey Room  > Creative Journey  3rd Tuesday of the month 1130am-1pm, Journey Room  > Look Good Feel Better  1st Wednesday of the month 10am-12 noon, Journey Room (Call American [employer] to register 1-800-395-5775)   

## 2017-01-10 NOTE — Progress Notes (Signed)
Manuel Rivera presented for Portacath access and flush. Proper placement of portacath confirmed by CXR. Portacath located left chest wall accessed with  H 20 needle. Good blood return present. Portacath flushed with 70m NS and 500U/520mHeparin and needle removed intact. Procedure without incident. Patient tolerated procedure well.

## 2017-01-20 DIAGNOSIS — R05 Cough: Secondary | ICD-10-CM | POA: Diagnosis not present

## 2017-01-20 DIAGNOSIS — J440 Chronic obstructive pulmonary disease with acute lower respiratory infection: Secondary | ICD-10-CM | POA: Diagnosis not present

## 2017-01-20 DIAGNOSIS — Z6829 Body mass index (BMI) 29.0-29.9, adult: Secondary | ICD-10-CM | POA: Diagnosis not present

## 2017-01-20 DIAGNOSIS — C3490 Malignant neoplasm of unspecified part of unspecified bronchus or lung: Secondary | ICD-10-CM | POA: Diagnosis not present

## 2017-01-20 DIAGNOSIS — K5900 Constipation, unspecified: Secondary | ICD-10-CM | POA: Diagnosis not present

## 2017-01-29 DIAGNOSIS — R634 Abnormal weight loss: Secondary | ICD-10-CM | POA: Diagnosis not present

## 2017-01-29 DIAGNOSIS — M62838 Other muscle spasm: Secondary | ICD-10-CM | POA: Diagnosis not present

## 2017-01-29 DIAGNOSIS — C3490 Malignant neoplasm of unspecified part of unspecified bronchus or lung: Secondary | ICD-10-CM | POA: Diagnosis not present

## 2017-01-29 DIAGNOSIS — J440 Chronic obstructive pulmonary disease with acute lower respiratory infection: Secondary | ICD-10-CM | POA: Diagnosis not present

## 2017-01-29 DIAGNOSIS — I10 Essential (primary) hypertension: Secondary | ICD-10-CM | POA: Diagnosis not present

## 2017-02-13 DIAGNOSIS — L609 Nail disorder, unspecified: Secondary | ICD-10-CM | POA: Diagnosis not present

## 2017-02-13 DIAGNOSIS — L110 Acquired keratosis follicularis: Secondary | ICD-10-CM | POA: Diagnosis not present

## 2017-02-13 DIAGNOSIS — I739 Peripheral vascular disease, unspecified: Secondary | ICD-10-CM | POA: Diagnosis not present

## 2017-02-18 DIAGNOSIS — J189 Pneumonia, unspecified organism: Secondary | ICD-10-CM

## 2017-02-18 HISTORY — DX: Pneumonia, unspecified organism: J18.9

## 2017-02-28 ENCOUNTER — Encounter (HOSPITAL_COMMUNITY): Payer: PPO | Attending: Oncology

## 2017-02-28 ENCOUNTER — Encounter (HOSPITAL_COMMUNITY): Payer: Self-pay

## 2017-02-28 DIAGNOSIS — Z85118 Personal history of other malignant neoplasm of bronchus and lung: Secondary | ICD-10-CM

## 2017-02-28 DIAGNOSIS — Z23 Encounter for immunization: Secondary | ICD-10-CM | POA: Insufficient documentation

## 2017-02-28 DIAGNOSIS — Z452 Encounter for adjustment and management of vascular access device: Secondary | ICD-10-CM | POA: Diagnosis not present

## 2017-02-28 DIAGNOSIS — C3492 Malignant neoplasm of unspecified part of left bronchus or lung: Secondary | ICD-10-CM | POA: Insufficient documentation

## 2017-02-28 MED ORDER — SODIUM CHLORIDE 0.9% FLUSH
10.0000 mL | INTRAVENOUS | Status: DC | PRN
  Administered 2017-02-28: 10 mL via INTRAVENOUS
  Filled 2017-02-28: qty 10

## 2017-02-28 MED ORDER — HEPARIN SOD (PORK) LOCK FLUSH 100 UNIT/ML IV SOLN
500.0000 [IU] | Freq: Once | INTRAVENOUS | Status: AC
  Administered 2017-02-28: 500 [IU] via INTRAVENOUS

## 2017-02-28 MED ORDER — HEPARIN SOD (PORK) LOCK FLUSH 100 UNIT/ML IV SOLN
INTRAVENOUS | Status: AC
  Filled 2017-02-28: qty 5

## 2017-02-28 NOTE — Progress Notes (Signed)
Manuel Rivera presented for Portacath access and flush. Portacath located left chest wall accessed with  H 20 needle. Good blood return present. Portacath flushed with 37m NS and 500U/51mHeparin and needle removed intact. Procedure without incident. Patient tolerated procedure well.   Vitals stable and discharged home from clinic ambulatory Follow up as scheduled.

## 2017-02-28 NOTE — Patient Instructions (Signed)
Manuel Rivera at [facility] Discharge Instructions  RECOMMENDATIONS MADE BY THE CONSULTANT AND ANY TEST RESULTS WILL BE SENT TO YOUR REFERRING PHYSICIAN.  Port flush done today Follow as scheduled.  Thank you for choosing [place] at [facility] to provide your oncology and hematology care.  To afford each patient quality time with our provider, please arrive at least 15 minutes before your scheduled appointment time.    If you have a lab appointment with the [facility] please come in thru the  Main Entrance and check in at the main information desk  You need to re-schedule your appointment should you arrive 10 or more minutes late.  We strive to give you quality time with our providers, and arriving late affects you and other patients whose appointments are after yours.  Also, if you no show three or more times for appointments you may be dismissed from the clinic at the providers discretion.     Again, thank you for choosing [facility].  Our hope is that these requests will decrease the amount of time that you wait before being seen by our physicians.       _____________________________________________________________  Should you have questions after your visit to [facility], please contact our office at [phone] between the hours of 8:30 a.m. and 4:30 p.m.  Voicemails left after 4:30 p.m. will not be returned until the following business day.  For prescription refill requests, have your pharmacy contact our office.       Resources For Cancer Patients and their Caregivers ? American Cancer Society: Can assist with transportation, wigs, general needs, runs Look Good Feel Better.        (905)814-5655 ? Cancer Care: Provides financial assistance, online support groups, medication/co-pay assistance.  1-800-813-HOPE (306)221-5449) ? [facility] Assists [place] cancer patients and their  families through emotional , educational and financial support.  251-161-1549 ? [place] DSS Where to apply for food stamps, Medicaid and utility assistance. 803-549-2557 ? RCATS: Transportation to medical appointments. (309) 333-1122 ? Social Security Administration: May apply for disability if have a Stage IV cancer. 762-322-5549 920 511 5688 ? [employer], Disability and Transit Services: Assists with nutrition, care and transit needs. [place] Support Programs: '@10RELATIVEDAYS'$ @ > Cancer Support Group  2nd Tuesday of the month 1pm-2pm, Journey Room  > Creative Journey  3rd Tuesday of the month 1130am-1pm, Journey Room  > Look Good Feel Better  1st Wednesday of the month 10am-12 noon, Journey Room (Call [place] to register [phone])

## 2017-03-03 DIAGNOSIS — M25562 Pain in left knee: Secondary | ICD-10-CM | POA: Diagnosis not present

## 2017-03-03 DIAGNOSIS — M25561 Pain in right knee: Secondary | ICD-10-CM | POA: Diagnosis not present

## 2017-03-03 DIAGNOSIS — M170 Bilateral primary osteoarthritis of knee: Secondary | ICD-10-CM | POA: Diagnosis not present

## 2017-03-04 DIAGNOSIS — C3490 Malignant neoplasm of unspecified part of unspecified bronchus or lung: Secondary | ICD-10-CM | POA: Diagnosis not present

## 2017-03-04 DIAGNOSIS — I10 Essential (primary) hypertension: Secondary | ICD-10-CM | POA: Diagnosis not present

## 2017-03-04 DIAGNOSIS — Z6828 Body mass index (BMI) 28.0-28.9, adult: Secondary | ICD-10-CM | POA: Diagnosis not present

## 2017-03-04 DIAGNOSIS — R05 Cough: Secondary | ICD-10-CM | POA: Diagnosis not present

## 2017-03-04 DIAGNOSIS — J440 Chronic obstructive pulmonary disease with acute lower respiratory infection: Secondary | ICD-10-CM | POA: Diagnosis not present

## 2017-03-07 ENCOUNTER — Encounter (HOSPITAL_COMMUNITY): Payer: PPO

## 2017-03-11 DIAGNOSIS — I25810 Atherosclerosis of coronary artery bypass graft(s) without angina pectoris: Secondary | ICD-10-CM | POA: Diagnosis not present

## 2017-03-11 DIAGNOSIS — N183 Chronic kidney disease, stage 3 (moderate): Secondary | ICD-10-CM | POA: Diagnosis not present

## 2017-03-11 DIAGNOSIS — E079 Disorder of thyroid, unspecified: Secondary | ICD-10-CM | POA: Diagnosis not present

## 2017-03-11 DIAGNOSIS — Z125 Encounter for screening for malignant neoplasm of prostate: Secondary | ICD-10-CM | POA: Diagnosis not present

## 2017-03-11 DIAGNOSIS — R55 Syncope and collapse: Secondary | ICD-10-CM | POA: Diagnosis not present

## 2017-03-11 DIAGNOSIS — C3490 Malignant neoplasm of unspecified part of unspecified bronchus or lung: Secondary | ICD-10-CM | POA: Diagnosis not present

## 2017-03-19 DIAGNOSIS — C3490 Malignant neoplasm of unspecified part of unspecified bronchus or lung: Secondary | ICD-10-CM | POA: Diagnosis not present

## 2017-03-19 DIAGNOSIS — R930 Abnormal findings on diagnostic imaging of skull and head, not elsewhere classified: Secondary | ICD-10-CM | POA: Diagnosis not present

## 2017-03-19 DIAGNOSIS — R55 Syncope and collapse: Secondary | ICD-10-CM | POA: Diagnosis not present

## 2017-03-20 DIAGNOSIS — C3492 Malignant neoplasm of unspecified part of left bronchus or lung: Secondary | ICD-10-CM | POA: Diagnosis not present

## 2017-03-20 DIAGNOSIS — I509 Heart failure, unspecified: Secondary | ICD-10-CM | POA: Diagnosis not present

## 2017-03-24 ENCOUNTER — Emergency Department (HOSPITAL_COMMUNITY): Payer: PPO

## 2017-03-24 ENCOUNTER — Encounter (HOSPITAL_COMMUNITY): Payer: Self-pay | Admitting: *Deleted

## 2017-03-24 ENCOUNTER — Inpatient Hospital Stay (HOSPITAL_COMMUNITY)
Admission: EM | Admit: 2017-03-24 | Discharge: 2017-03-28 | DRG: 248 | Disposition: A | Discharge status: Home / Self Care | Payer: PPO | Attending: Family Medicine | Admitting: Family Medicine

## 2017-03-24 ENCOUNTER — Encounter: Payer: Self-pay | Admitting: Physician Assistant

## 2017-03-24 ENCOUNTER — Ambulatory Visit (INDEPENDENT_AMBULATORY_CARE_PROVIDER_SITE_OTHER): Payer: PPO | Admitting: Physician Assistant

## 2017-03-24 ENCOUNTER — Other Ambulatory Visit: Payer: Self-pay

## 2017-03-24 VITALS — BP 112/80 | HR 73 | Ht 71.0 in | Wt 184.0 lb

## 2017-03-24 DIAGNOSIS — Z85828 Personal history of other malignant neoplasm of skin: Secondary | ICD-10-CM | POA: Diagnosis not present

## 2017-03-24 DIAGNOSIS — I249 Acute ischemic heart disease, unspecified: Secondary | ICD-10-CM | POA: Diagnosis present

## 2017-03-24 DIAGNOSIS — Z9221 Personal history of antineoplastic chemotherapy: Secondary | ICD-10-CM | POA: Diagnosis not present

## 2017-03-24 DIAGNOSIS — M109 Gout, unspecified: Secondary | ICD-10-CM | POA: Diagnosis not present

## 2017-03-24 DIAGNOSIS — J209 Acute bronchitis, unspecified: Secondary | ICD-10-CM | POA: Diagnosis present

## 2017-03-24 DIAGNOSIS — S065X9A Traumatic subdural hemorrhage with loss of consciousness of unspecified duration, initial encounter: Secondary | ICD-10-CM | POA: Diagnosis present

## 2017-03-24 DIAGNOSIS — R0602 Shortness of breath: Secondary | ICD-10-CM | POA: Diagnosis not present

## 2017-03-24 DIAGNOSIS — E876 Hypokalemia: Secondary | ICD-10-CM

## 2017-03-24 DIAGNOSIS — J449 Chronic obstructive pulmonary disease, unspecified: Secondary | ICD-10-CM | POA: Diagnosis not present

## 2017-03-24 DIAGNOSIS — R0601 Orthopnea: Secondary | ICD-10-CM | POA: Diagnosis not present

## 2017-03-24 DIAGNOSIS — R55 Syncope and collapse: Secondary | ICD-10-CM

## 2017-03-24 DIAGNOSIS — I25810 Atherosclerosis of coronary artery bypass graft(s) without angina pectoris: Secondary | ICD-10-CM | POA: Diagnosis present

## 2017-03-24 DIAGNOSIS — I2510 Atherosclerotic heart disease of native coronary artery without angina pectoris: Secondary | ICD-10-CM

## 2017-03-24 DIAGNOSIS — M1990 Unspecified osteoarthritis, unspecified site: Secondary | ICD-10-CM | POA: Diagnosis not present

## 2017-03-24 DIAGNOSIS — F419 Anxiety disorder, unspecified: Secondary | ICD-10-CM | POA: Diagnosis present

## 2017-03-24 DIAGNOSIS — I6203 Nontraumatic chronic subdural hemorrhage: Secondary | ICD-10-CM | POA: Diagnosis not present

## 2017-03-24 DIAGNOSIS — I739 Peripheral vascular disease, unspecified: Secondary | ICD-10-CM | POA: Diagnosis present

## 2017-03-24 DIAGNOSIS — I6200 Nontraumatic subdural hemorrhage, unspecified: Secondary | ICD-10-CM | POA: Diagnosis not present

## 2017-03-24 DIAGNOSIS — E7800 Pure hypercholesterolemia, unspecified: Secondary | ICD-10-CM | POA: Diagnosis not present

## 2017-03-24 DIAGNOSIS — F329 Major depressive disorder, single episode, unspecified: Secondary | ICD-10-CM | POA: Diagnosis not present

## 2017-03-24 DIAGNOSIS — C3490 Malignant neoplasm of unspecified part of unspecified bronchus or lung: Secondary | ICD-10-CM | POA: Diagnosis not present

## 2017-03-24 DIAGNOSIS — I080 Rheumatic disorders of both mitral and aortic valves: Secondary | ICD-10-CM | POA: Diagnosis present

## 2017-03-24 DIAGNOSIS — E039 Hypothyroidism, unspecified: Secondary | ICD-10-CM | POA: Diagnosis present

## 2017-03-24 DIAGNOSIS — Z85118 Personal history of other malignant neoplasm of bronchus and lung: Secondary | ICD-10-CM | POA: Diagnosis not present

## 2017-03-24 DIAGNOSIS — E785 Hyperlipidemia, unspecified: Secondary | ICD-10-CM | POA: Diagnosis present

## 2017-03-24 DIAGNOSIS — R778 Other specified abnormalities of plasma proteins: Secondary | ICD-10-CM

## 2017-03-24 DIAGNOSIS — I252 Old myocardial infarction: Secondary | ICD-10-CM | POA: Diagnosis not present

## 2017-03-24 DIAGNOSIS — S065XAA Traumatic subdural hemorrhage with loss of consciousness status unknown, initial encounter: Secondary | ICD-10-CM

## 2017-03-24 DIAGNOSIS — Z87891 Personal history of nicotine dependence: Secondary | ICD-10-CM

## 2017-03-24 DIAGNOSIS — I5033 Acute on chronic diastolic (congestive) heart failure: Secondary | ICD-10-CM | POA: Diagnosis not present

## 2017-03-24 DIAGNOSIS — Z79899 Other long term (current) drug therapy: Secondary | ICD-10-CM | POA: Diagnosis not present

## 2017-03-24 DIAGNOSIS — H547 Unspecified visual loss: Secondary | ICD-10-CM

## 2017-03-24 DIAGNOSIS — I25700 Atherosclerosis of coronary artery bypass graft(s), unspecified, with unstable angina pectoris: Secondary | ICD-10-CM | POA: Diagnosis not present

## 2017-03-24 DIAGNOSIS — R0989 Other specified symptoms and signs involving the circulatory and respiratory systems: Secondary | ICD-10-CM

## 2017-03-24 DIAGNOSIS — R748 Abnormal levels of other serum enzymes: Secondary | ICD-10-CM | POA: Diagnosis not present

## 2017-03-24 DIAGNOSIS — I214 Non-ST elevation (NSTEMI) myocardial infarction: Principal | ICD-10-CM

## 2017-03-24 DIAGNOSIS — Z955 Presence of coronary angioplasty implant and graft: Secondary | ICD-10-CM

## 2017-03-24 DIAGNOSIS — I509 Heart failure, unspecified: Secondary | ICD-10-CM | POA: Diagnosis not present

## 2017-03-24 DIAGNOSIS — N183 Chronic kidney disease, stage 3 (moderate): Secondary | ICD-10-CM | POA: Diagnosis not present

## 2017-03-24 DIAGNOSIS — W11XXXA Fall on and from ladder, initial encounter: Secondary | ICD-10-CM | POA: Diagnosis present

## 2017-03-24 DIAGNOSIS — I340 Nonrheumatic mitral (valve) insufficiency: Secondary | ICD-10-CM | POA: Diagnosis not present

## 2017-03-24 DIAGNOSIS — Z923 Personal history of irradiation: Secondary | ICD-10-CM

## 2017-03-24 DIAGNOSIS — R05 Cough: Secondary | ICD-10-CM | POA: Diagnosis not present

## 2017-03-24 DIAGNOSIS — R9439 Abnormal result of other cardiovascular function study: Secondary | ICD-10-CM | POA: Diagnosis not present

## 2017-03-24 DIAGNOSIS — I350 Nonrheumatic aortic (valve) stenosis: Secondary | ICD-10-CM | POA: Diagnosis not present

## 2017-03-24 DIAGNOSIS — R7989 Other specified abnormal findings of blood chemistry: Secondary | ICD-10-CM

## 2017-03-24 HISTORY — DX: Depression, unspecified: F32.A

## 2017-03-24 HISTORY — DX: Heart failure, unspecified: I50.9

## 2017-03-24 HISTORY — DX: Angina pectoris, unspecified: I20.9

## 2017-03-24 HISTORY — DX: Acute myocardial infarction, unspecified: I21.9

## 2017-03-24 HISTORY — DX: Gout, unspecified: M10.9

## 2017-03-24 HISTORY — DX: Pneumonia, unspecified organism: J18.9

## 2017-03-24 HISTORY — DX: Major depressive disorder, single episode, unspecified: F32.9

## 2017-03-24 HISTORY — DX: Essential (primary) hypertension: I10

## 2017-03-24 HISTORY — DX: Squamous cell carcinoma of skin of unspecified lower limb, including hip: C44.721

## 2017-03-24 LAB — BASIC METABOLIC PANEL
Anion gap: 10 (ref 5–15)
BUN: 22 mg/dL — AB (ref 6–20)
CHLORIDE: 98 mmol/L — AB (ref 101–111)
CO2: 31 mmol/L (ref 22–32)
CREATININE: 1.28 mg/dL — AB (ref 0.61–1.24)
Calcium: 9.7 mg/dL (ref 8.9–10.3)
GFR calc Af Amer: >60 mL/min (ref >60)
GFR calc non Af Amer: 54 mL/min — ABNORMAL LOW (ref >60)
GLUCOSE: 101 mg/dL — AB (ref 65–99)
POTASSIUM: 3.7 mmol/L (ref 3.5–5.1)
SODIUM: 139 mmol/L (ref 135–145)

## 2017-03-24 LAB — CBC WITH DIFFERENTIAL/PLATELET
BASOS ABS: 0 10*3/uL (ref 0.0–0.1)
Basophils Relative: 0 %
EOS PCT: 1 %
Eosinophils Absolute: 0.1 10*3/uL (ref 0.0–0.7)
HEMATOCRIT: 41.8 % (ref 39.0–52.0)
Hemoglobin: 13.7 g/dL (ref 13.0–17.0)
LYMPHS ABS: 1 10*3/uL (ref 0.7–4.0)
LYMPHS PCT: 19 %
MCH: 32.5 pg (ref 26.0–34.0)
MCHC: 32.8 g/dL (ref 30.0–36.0)
MCV: 99.3 fL (ref 78.0–100.0)
MONO ABS: 0.6 10*3/uL (ref 0.1–1.0)
MONOS PCT: 11 %
NEUTROS ABS: 3.4 10*3/uL (ref 1.7–7.7)
Neutrophils Relative %: 69 %
PLATELETS: 199 10*3/uL (ref 150–400)
RBC: 4.21 MIL/uL — ABNORMAL LOW (ref 4.22–5.81)
RDW: 16.3 % — AB (ref 11.5–15.5)
WBC: 5 10*3/uL (ref 4.0–10.5)

## 2017-03-24 LAB — TROPONIN I: Troponin I: 9.05 ng/mL (ref <0.03)

## 2017-03-24 LAB — D-DIMER, QUANTITATIVE: D-Dimer, Quant: 0.79 ug/mL-FEU — ABNORMAL HIGH (ref 0.00–0.50)

## 2017-03-24 LAB — BRAIN NATRIURETIC PEPTIDE: B Natriuretic Peptide: 620 pg/mL — ABNORMAL HIGH (ref 0.0–100.0)

## 2017-03-24 MED ORDER — ACETAMINOPHEN 650 MG RE SUPP: 650.0000 mg | Freq: Four times a day (QID) | RECTAL | Status: DC | PRN

## 2017-03-24 MED ORDER — SODIUM CHLORIDE 0.9% FLUSH: 10.0000 mL | INTRAVENOUS | Status: DC | PRN

## 2017-03-24 MED ORDER — FUROSEMIDE 80 MG PO TABS
80.0000 mg | ORAL_TABLET | Freq: Every day | ORAL | Status: DC
  Administered 2017-03-25 – 2017-03-26 (×2): 80 mg via ORAL
  Filled 2017-03-24 (×2): qty 1

## 2017-03-24 MED ORDER — SODIUM CHLORIDE 0.9% FLUSH
10.0000 mL | INTRAVENOUS | Status: DC | PRN
  Administered 2017-03-25 (×2): 10 mL
  Administered 2017-03-25: 20 mL
  Administered 2017-03-26 – 2017-03-28 (×3): 10 mL
  Filled 2017-03-24 (×6): qty 40

## 2017-03-24 MED ORDER — LEVOTHYROXINE SODIUM 50 MCG PO TABS
50.0000 ug | ORAL_TABLET | Freq: Every day | ORAL | Status: DC
  Administered 2017-03-25 – 2017-03-28 (×4): 50 ug via ORAL
  Filled 2017-03-24 (×4): qty 1

## 2017-03-24 MED ORDER — ONDANSETRON HCL 4 MG/2ML IJ SOLN: 4.0000 mg | Freq: Four times a day (QID) | INTRAMUSCULAR | Status: DC | PRN

## 2017-03-24 MED ORDER — ALLOPURINOL 300 MG PO TABS
300.0000 mg | ORAL_TABLET | Freq: Every day | ORAL | Status: DC
  Administered 2017-03-25 – 2017-03-28 (×3): 300 mg via ORAL
  Filled 2017-03-24 (×3): qty 1

## 2017-03-24 MED ORDER — ASPIRIN 325 MG PO TABS
325.0000 mg | ORAL_TABLET | Freq: Every day | ORAL | Status: DC
  Filled 2017-03-24: qty 1

## 2017-03-24 MED ORDER — ALPRAZOLAM 0.5 MG PO TABS
0.5000 mg | ORAL_TABLET | Freq: Every day | ORAL | Status: DC
  Administered 2017-03-24 – 2017-03-27 (×4): 0.5 mg via ORAL
  Filled 2017-03-24 (×5): qty 1

## 2017-03-24 MED ORDER — SODIUM CHLORIDE 0.9% FLUSH
10.0000 mL | Freq: Two times a day (BID) | INTRAVENOUS | Status: DC
  Administered 2017-03-25 – 2017-03-28 (×2): 10 mL

## 2017-03-24 MED ORDER — SODIUM CHLORIDE 0.9 % IV SOLN: INTRAVENOUS | Status: DC

## 2017-03-24 MED ORDER — HYDROCODONE-ACETAMINOPHEN 5-325 MG PO TABS: 1.0000 | ORAL_TABLET | ORAL | Status: DC | PRN

## 2017-03-24 MED ORDER — METHYLPREDNISOLONE SODIUM SUCC 40 MG IJ SOLR
40.0000 mg | Freq: Four times a day (QID) | INTRAMUSCULAR | Status: DC
  Administered 2017-03-24 – 2017-03-28 (×14): 40 mg via INTRAVENOUS
  Filled 2017-03-24 (×14): qty 1

## 2017-03-24 MED ORDER — ONDANSETRON HCL 4 MG PO TABS: 4.0000 mg | ORAL_TABLET | Freq: Four times a day (QID) | ORAL | Status: DC | PRN

## 2017-03-24 MED ORDER — SIMVASTATIN 20 MG PO TABS
40.0000 mg | ORAL_TABLET | Freq: Every day | ORAL | Status: DC
  Administered 2017-03-24 – 2017-03-27 (×4): 40 mg via ORAL
  Filled 2017-03-24: qty 1
  Filled 2017-03-24: qty 2
  Filled 2017-03-24 (×2): qty 1

## 2017-03-24 MED ORDER — COLCHICINE 0.6 MG PO TABS
0.6000 mg | ORAL_TABLET | Freq: Two times a day (BID) | ORAL | Status: DC
  Administered 2017-03-24 – 2017-03-28 (×7): 0.6 mg via ORAL
  Filled 2017-03-24 (×7): qty 1

## 2017-03-24 MED ORDER — ACETAMINOPHEN 325 MG PO TABS: 650.0000 mg | ORAL_TABLET | Freq: Four times a day (QID) | ORAL | Status: DC | PRN

## 2017-03-24 MED ORDER — IPRATROPIUM-ALBUTEROL 0.5-2.5 (3) MG/3ML IN SOLN
3.0000 mL | Freq: Four times a day (QID) | RESPIRATORY_TRACT | Status: DC
  Filled 2017-03-24: qty 3

## 2017-03-24 MED ORDER — ASPIRIN 81 MG PO CHEW
324.0000 mg | CHEWABLE_TABLET | Freq: Once | ORAL | Status: AC
  Administered 2017-03-24: 324 mg via ORAL
  Filled 2017-03-24: qty 4

## 2017-03-24 NOTE — ED Provider Notes (Signed)
[facility] DEPT Provider Note   CSN: [id number] Arrival date & time: 03/24/17  1432     History   Chief Complaint Chief Complaint  Patient presents with  . Abnormal Lab    MRI    HPI Manuel Rivera is a 71 y.o. male.  HPI  Pt was seen at 1730. Per pt and his family, c/o sudden onset and resolution of one episode of syncope that occurred 1 month ago. Pt had MRI brain by his Heme/Onc MD on 03/19/17 at [facility] "to see if his cancer spread" and "see why he was so tired, off balance with walking, and confused."  Pt was called today by his PMD and told he "had blood in his brain" and to go to the ED for evaluation. Pt also has a routine Cards MD appointment today, and told the provider there that pt has been SOB with worsening with laying down for the past 1 week. Pt was sent to "get some blood work." Denies any new neuro symptoms since obtaining MRI brain last week. Denies CP/palpitations, no cough, no abd pain, no N/V/D, no fevers, no focal motor weakness, no tingling/numbness in extremities.   Past Medical History:  Diagnosis Date  . Anxiety   . Arthritis   . CAD in native artery    a. s/p CABG 1997.  . Cancer [facility]) 2014   Small Cell Lung Cancer  . CKD (chronic kidney disease), stage III    by labs  . COPD (chronic obstructive pulmonary disease) ([place])   . Hyperlipidemia   . Hypothyroidism   . Lipoma   . Memory loss   . Mild mitral regurgitation   . Panic attack   . Peripheral vascular disease, unspecified ([place])   . Small cell lung cancer ([place]) 06/19/2016  . Systolic murmur     Patient Active Problem List   Diagnosis Date Noted  . Small cell lung cancer ([place]) 06/19/2016  . HYPERLIPIDEMIA-MIXED 08/06/2009  . CAD, ARTERY BYPASS GRAFT 08/06/2009  . PVD 08/06/2009  . MURMUR 08/06/2009    Past Surgical History:  Procedure Laterality Date  . basal cell cancer removed from right leg    . COLONOSCOPY    . COLONOSCOPY N/A 05/09/2016   Procedure: COLONOSCOPY;   Surgeon: Rogene Houston, MD;  Location: [facility] ENDO SUITE;  Service: Endoscopy;  Laterality: N/A;  1030  . CORONARY ARTERY BYPASS GRAFT    . HEMORRHOIDECTOMY WITH HEMORRHOID BANDING         Home Medications    Prior to Admission medications   Medication Sig Start Date End Date Taking? Authorizing Provider  ADVAIR DISKUS 100-50 MCG/DOSE AEPB Inhale 1 puff into the lungs 2 (two) times daily. 05/02/16   [provider]  albuterol (PROVENTIL HFA;VENTOLIN HFA) 108 (90 Base) MCG/ACT inhaler Inhale 1-2 puffs into the lungs every 6 (six) hours as needed for wheezing or shortness of breath.    [provider]  allopurinol (ZYLOPRIM) 300 MG tablet Take 300 mg by mouth daily. 02/29/16   [provider]  ALPRAZolam Duanne Moron) 0.5 MG tablet Take 0.5 mg by mouth at bedtime. 05/02/16   [provider]  beta carotene 25000 UNIT capsule Take by mouth.    [provider]  calcium carbonate (OSCAL) 1500 (600 Ca) MG TABS tablet Take 600 mg of elemental calcium by mouth 2 (two) times daily with a meal.    [provider]  colchicine 0.6 MG tablet Take 0.6 mg by mouth 2 (two) times  daily. 04/20/16   [provider]  furosemide (LASIX) 80 MG tablet Take 80 mg by mouth daily. 04/20/16   [provider]  HYDROcodone-acetaminophen (NORCO/VICODIN) 5-325 MG tablet Take 1 tablet by mouth every 4 (four) hours as needed for moderate pain. 12/04/16   Holley Bouche, NP  levothyroxine (SYNTHROID, LEVOTHROID) 50 MCG tablet Take 50 mcg by mouth every morning. 04/05/16   [provider]  magic mouthwash SOLN TAKE ONE TEASPOONFUL (5ML) BY MOUTH FOUR TIMES DAILY FOR SORES IN MOUTH FROM CHEMOTHEROPY 02/15/16   [provider]  Multiple Vitamin (MULTIVITAMIN WITH MINERALS) TABS tablet Take 1 tablet by mouth daily.    [provider]  naproxen (NAPROSYN) 500 MG tablet Take 1 tablet (500 mg total) by mouth 2 (two) times daily. 05/14/16   Rehman,  Mechele Dawley, MD  potassium chloride SA (K-DUR,KLOR-CON) 20 MEQ tablet TAKE TWO (2) TABLETS BY MOUTH DAILY. 12/09/16   Baird Cancer, PA-C  simvastatin (ZOCOR) 40 MG tablet Take 40 mg by mouth at bedtime. 02/06/16   [provider]  tiotropium (SPIRIVA) 18 MCG inhalation capsule Place 18 mcg into inhaler and inhale daily.    [provider]    Family History Family History  Problem Relation Age of Onset  . CAD Mother        MI 86    Social History Social History  Substance Use Topics  . Smoking status: Former Smoker    Packs/day: 3.00    Years: 50.00    Types: Cigarettes    Quit date: 05/09/2012  . Smokeless tobacco: Never Used  . Alcohol use No     Allergies   Patient has no known allergies.   Review of Systems Review of Systems ROS: Statement: All systems negative except as marked or noted in the HPI; Constitutional: Negative for fever and chills. ; ; Eyes: Negative for eye pain, redness and discharge. ; ; ENMT: Negative for ear pain, hoarseness, nasal congestion, sinus pressure and sore throat. ; ; Cardiovascular: +SOB, orthopnea. Negative for chest pain, palpitations, diaphoresis, and peripheral edema. ; ; Respiratory: Negative for cough, wheezing and stridor. ; ; Gastrointestinal: Negative for nausea, vomiting, diarrhea, abdominal pain, blood in stool, hematemesis, jaundice and rectal bleeding. . ; ; Genitourinary: Negative for dysuria, flank pain and hematuria. ; ; Musculoskeletal: Negative for back pain and neck pain. Negative for swelling and trauma.; ; Skin: Negative for pruritus, rash, abrasions, blisters, bruising and skin lesion.; ; Neuro: +gait instability, confusion, fatigue. Negative for headache, lightheadedness and neck stiffness. Negative for altered level of consciousness, extremity weakness, paresthesias, involuntary movement, seizure and syncope.      Physical Exam Updated Vital Signs BP (!) 144/74 (BP Location: Right Arm)   Pulse 67   Temp  97.8 F (36.6 C) (Oral)   Resp 16   Ht 5\' 11"  (1.803 m)   Wt 83.5 kg (184 lb)   SpO2 96%   BMI 25.66 kg/m   Physical Exam 1735; Physical examination:  Nursing notes reviewed; Vital signs and O2 SAT reviewed;  Constitutional: Well developed, Well nourished, Well hydrated, In no acute distress; Head:  Normocephalic, atraumatic; Eyes: EOMI, PERRL, No scleral icterus; ENMT: Mouth and pharynx normal, Mucous membranes moist; Neck: Supple, Full range of motion, No lymphadenopathy; Cardiovascular: Regular rate and rhythm, No gallop; Respiratory: Breath sounds coarse & equal bilaterally, No wheezes.  Speaking full sentences with ease, Normal respiratory effort/excursion; Chest: Nontender, Movement normal; Abdomen: Soft, Nontender, Nondistended, Normal bowel sounds; Genitourinary: No  CVA tenderness; Extremities: Pulses normal, No tenderness, No edema, No calf edema or asymmetry.; Neuro: AA&Ox3, Major CN grossly intact. No facial droop. Speech clear. No gross focal motor or sensory deficits in extremities.; Skin: Color normal, Warm, Dry.   ED Treatments / Results  Labs (all labs ordered are listed, but only abnormal results are displayed)   EKG  EKG Interpretation  Date/Time:  Monday March 24 2017 18:24:58 EDT Ventricular Rate:  75 PR Interval:    QRS Duration: 116 QT Interval:  456 QTC Calculation: 510 R Axis:   89 Text Interpretation:  Sinus rhythm Nonspecific intraventricular conduction delay No old tracing to compare Confirmed by [facility]  MD, Nunzio Cory [phone]) on 03/24/2017 6:48:44 PM       Radiology   Procedures Procedures (including critical care time)  Medications Ordered in ED Medications - No data to display   Initial Impression / Assessment and Plan / ED Course  I have reviewed the triage vital signs and the nursing notes.  Pertinent labs & imaging results that were available during my care of the patient were reviewed by me and considered in my medical decision making (see  chart for details).  MDM Reviewed: previous chart, nursing note and vitals Reviewed previous: labs, ECG and MRI Interpretation: labs, ECG and x-ray Total time providing critical care: 30-74 minutes. This excludes time spent performing separately reportable procedures and services. Consults: admitting MD, neurosurgery and cardiology   CRITICAL CARE Performed by: Alfonzo Feller Total critical care time: 35 minutes Critical care time was exclusive of separately billable procedures and treating other patients. Critical care was necessary to treat or prevent imminent or life-threatening deterioration. Critical care was time spent personally by me on the following activities: development of treatment plan with patient and/or surrogate as well as nursing, discussions with consultants, evaluation of patient's response to treatment, examination of patient, obtaining history from patient or surrogate, ordering and performing treatments and interventions, ordering and review of laboratory studies, ordering and review of radiographic studies, pulse oximetry and re-evaluation of patient's condition.   Results for orders placed or performed during the hospital encounter of 63/87/56  Basic metabolic panel  Result Value Ref Range   Sodium 139 135 - 145 mmol/L   Potassium 3.7 3.5 - 5.1 mmol/L   Chloride 98 (L) 101 - 111 mmol/L   CO2 31 22 - 32 mmol/L   Glucose, Bld 101 (H) 65 - 99 mg/dL   BUN 22 (H) 6 - 20 mg/dL   Creatinine, Ser 1.28 (H) 0.61 - 1.24 mg/dL   Calcium 9.7 8.9 - 10.3 mg/dL   GFR calc non Af Amer 54 (L) >60 mL/min   GFR calc Af Amer >60 >60 mL/min   Anion gap 10 5 - 15  Brain natriuretic peptide  Result Value Ref Range   B Natriuretic Peptide 620.0 (H) 0.0 - 100.0 pg/mL  CBC with Differential  Result Value Ref Range   WBC 5.0 4.0 - 10.5 K/uL   RBC 4.21 (L) 4.22 - 5.81 MIL/uL   Hemoglobin 13.7 13.0 - 17.0 g/dL   HCT 41.8 39.0 - 52.0 %   MCV 99.3 78.0 - 100.0 fL   MCH 32.5  26.0 - 34.0 pg   MCHC 32.8 30.0 - 36.0 g/dL   RDW 16.3 (H) 11.5 - 15.5 %   Platelets 199 150 - 400 K/uL   Neutrophils Relative % 69 %   Neutro Abs 3.4 1.7 - 7.7 K/uL   Lymphocytes Relative 19 %  Lymphs Abs 1.0 0.7 - 4.0 K/uL   Monocytes Relative 11 %   Monocytes Absolute 0.6 0.1 - 1.0 K/uL   Eosinophils Relative 1 %   Eosinophils Absolute 0.1 0.0 - 0.7 K/uL   Basophils Relative 0 %   Basophils Absolute 0.0 0.0 - 0.1 K/uL  Troponin I  Result Value Ref Range   Troponin I 9.05 (HH) <0.03 ng/mL  D-dimer, quantitative  Result Value Ref Range   D-Dimer, Quant 0.79 (H) 0.00 - 0.50 ug/mL-FEU   Dg Chest 2 View Result Date: 03/24/2017 CLINICAL DATA:  History of lung cancer.  Shortness of breath. EXAM: CHEST  2 VIEW COMPARISON:  01/29/2017 FINDINGS: Injectable Port-A-Cath is in stable position. Postsurgical changes from CABG, with re- demonstrated fractured sternal wires. The cardiac silhouette is normal. There is a relatively stable radiographically left suprahilar and upper lobe spiculated density. No evidence of airspace consolidation. Osseous structures are without acute abnormality. Soft tissues are grossly normal. IMPRESSION: Relatively stable radiographically left suprahilar and left upper lobe spiculated density. This may represent known post radiation changes, however focal recurrence cannot be excluded. Otherwise no evidence of focal airspace consolidation. Electronically Signed   By: Fidela Salisbury M.D.   On: 03/24/2017 15:39    MRI brain 03/19/2017: Impression:  No evidence of metastatic disease. Chronic small vessel ischemic changes as seen previously. 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 Small amount of more recent blood products at the right frontal vertex. No significant masss effect. No midline shift.    Ct Head Wo Contrast Result Date: 03/24/2017 CLINICAL DATA:  Syncopal episode EXAM: CT  HEAD WITHOUT CONTRAST TECHNIQUE: Contiguous axial images were obtained from the base of the skull through the vertex without intravenous contrast. COMPARISON:  MRI 03/19/2017 FINDINGS: Brain: No acute territorial infarction or intracranial mass is seen. Slightly complex right subdural collection, measuring 9 mm maximum thickness on coronal views and probably not significantly changed compared with MRI 03/19/2017. No midline shift. Mild periventricular white matter small vessel ischemic changes. Atrophy. Stable ventricle size. Vascular: No hyperdense vessels. Carotid artery calcifications. Vertebral artery calcifications. Skull: No fracture or suspicious bone lesion. Sinuses/Orbits: No acute finding. Other: None IMPRESSION: 1. 9 mm thick subdural collection along the right convexity. No midline shift or significant mass effect. 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 No definite acute hemorrhage is seen at this time. 2. Mild white matter small vessel ischemic changes. Electronically Signed   By: Donavan Foil M.D.   On: 03/24/2017 19:03     1755:  T/C to Neurosurgery Dr. Annette Stable, case discussed, including:  HPI, pertinent PM/SHx, VS/PE, dx testing, ED course and treatment:  He has viewed the MRI images, states no acute surgical issue at this time, can repeat imaging study today (CT head).   1845:  Troponin and BNP elevated; no old to compare. No overt CHF on CXR. Pt denies CP now or at any time recently. Will not dose ASA/IV heparin at this time; will wait for CT-H results and re-page Neurosurgery MD.  T/C to [facility] Cards Dr. Sallyanne Kuster, case discussed, including:  HPI, pertinent PM/SHx, VS/PE, dx testing, ED course and treatment:  Agreeable with plan, requests to transfer to [facility] under [facility] service and Cards will consult.  1930:  CT as above. T/C to [facility] Neurosurgery Dr. Annette Stable, case discussed, including:  HPI,  pertinent PM/SHx, VS/PE, dx testing, ED course and treatment:  He has viewed the CT images, no acute surgical issue at this time, it is OK to give ASA 325mg , no heparin/anticoagulant, service can consult prn when pt transferred to [place]:  Dx and testing, as well as d/w Cards MD and Neurosurgeon, d/w pt and family.  Questions answered.  Verb understanding, agreeable to admit. T/C to [facility] Dr. Darrick Meigs, case discussed, including:  HPI, pertinent PM/SHx, VS/PE, dx testing, ED course and treatment:  Agreeable to facilitate transfer/admit to [facility].      Final Clinical Impressions(s) / ED Diagnoses   Final diagnoses:  None    New Prescriptions New Prescriptions   No medications on file     Francine Graven, DO 03/28/17 1250

## 2017-03-24 NOTE — ED Notes (Signed)
Pts wife has a copy of the results.   Impressions state:  No evidence of metastatic disease. Chronic small vessel ischemic changes as seen previously. 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 Small amount of more recent blood products at the right frontal vertex. No significant masss effect. No midline shift.

## 2017-03-24 NOTE — ED Notes (Signed)
Admitting MD at bedside.

## 2017-03-24 NOTE — H&P (Addendum)
TRH H&P    Patient Demographics:    Manuel Rivera, is a 71 y.o. male  MRN: [id number]  DOB - 03/30/1946  Admit Date - 03/24/2017  Referring MD/NP/PA: Dr Thurnell Garbe  Outpatient Primary MD for the patient is Manuel Percy, MD  Patient coming from: Home  Chief Complaint  Patient presents with  . Abnormal Lab    MRI      HPI:    Manuel Rivera  is a 71 y.o. male, Is with history of small cell lung cancer diagnosed 2014 with radiation and chemotherapy, currently not on any treatment, CAD status post remote CABG in 1997, COPD, hypothyroidism, hyperlipidemia, chronic kidney disease stage III, peripheral vascular disease who was seen at cardiology office for evaluation of syncope.  While at the office patient received phone call from PCP that the MRI brain done on 03/09/2017 showed subdural hematoma and needs to go to ED immediately. Patient was sent to [facility] ED, with CT head confirmed 9 mm thick subdural collection along the right convexity. Patient also was found to have elevated troponin 9.05. EKG showed normal sinus rhythm with no ST changes. BNP elevated at 620.  Patient denies chest pain, mild shortness of breath. He is not requiring oxygen at this time O2 sats 95% on room air. Complains of coughing up yellow /white phlegm. He also has been having orthopnea. He denies nausea vomiting or diarrhea. Syncope episode about a month ago. He denies fever, no chills. No dysuria. No abdominal pain.  ED physician called neurosurgeon Dr. Annette Stable, who reviewed the images and stated to acute surgical need at this time. Okay to give aspirin but no heparin for anticoagulation at this time  Cardiology Dr Sallyanne Kuster was consulted, who recommends patient with transfer to [facility] and will be evaluated when patient arrives at [facility].    Review of systems:      A full 10 point Review of Systems was done, except as stated above,  all other Review of Systems were negative.   With Past History of the following :    Past Medical History:  Diagnosis Date  . Anxiety   . Arthritis   . CAD in native artery    a. s/p CABG 1997.  . Cancer [facility]) 2014   Small Cell Lung Cancer  . CKD (chronic kidney disease), stage III    by labs  . COPD (chronic obstructive pulmonary disease) ([place])   . Hyperlipidemia   . Hypothyroidism   . Lipoma   . Memory loss   . Mild mitral regurgitation   . Panic attack   . Peripheral vascular disease, unspecified ([place])   . Small cell lung cancer ([place]) 06/19/2016  . Systolic murmur       Past Surgical History:  Procedure Laterality Date  . basal cell cancer removed from right leg    . COLONOSCOPY    . COLONOSCOPY N/A 05/09/2016   Procedure: COLONOSCOPY;  Surgeon: Rogene Houston, MD;  Location: [facility];  Service: Endoscopy;  Laterality: N/A;  1030  . CORONARY  ARTERY BYPASS GRAFT    . HEMORRHOIDECTOMY WITH HEMORRHOID BANDING        Social History:      Social History  Substance Use Topics  . Smoking status: Former Smoker    Packs/day: 3.00    Years: 50.00    Types: Cigarettes    Quit date: 05/09/2012  . Smokeless tobacco: Never Used  . Alcohol use No       Family History :     Family History  Problem Relation Age of Onset  . CAD Mother        MI 2      Home Medications:   Prior to Admission medications   Medication Sig Start Date End Date Taking? Authorizing Provider  ADVAIR DISKUS 100-50 MCG/DOSE AEPB Inhale 1 puff into the lungs 2 (two) times daily. 05/02/16  Yes [provider]  albuterol (PROVENTIL HFA;VENTOLIN HFA) 108 (90 Base) MCG/ACT inhaler Inhale 1-2 puffs into the lungs every 6 (six) hours as needed for wheezing or shortness of breath.   Yes [provider]  allopurinol (ZYLOPRIM) 300 MG tablet Take 300 mg by mouth daily. 02/29/16  Yes [provider]  ALPRAZolam Duanne Moron) 0.5 MG tablet Take 1 mg by mouth at bedtime.   05/02/16  Yes [provider]  beta carotene 25000 UNIT capsule Take by mouth.   Yes [provider]  calcium carbonate (OSCAL) 1500 (600 Ca) MG TABS tablet Take 600 mg of elemental calcium by mouth 2 (two) times daily with a meal.   Yes [provider]  colchicine 0.6 MG tablet Take 0.6 mg by mouth 2 (two) times daily. 04/20/16  Yes [provider]  furosemide (LASIX) 80 MG tablet Take 80 mg by mouth daily. 04/20/16  Yes [provider]  HYDROcodone-acetaminophen (NORCO/VICODIN) 5-325 MG tablet Take 1 tablet by mouth every 4 (four) hours as needed for moderate pain. 12/04/16  Yes Holley Bouche, NP  levothyroxine (SYNTHROID, LEVOTHROID) 50 MCG tablet Take 50 mcg by mouth every morning. 04/05/16  Yes [provider]  Multiple Vitamin (MULTIVITAMIN WITH MINERALS) TABS tablet Take 1 tablet by mouth daily.   Yes [provider]  potassium chloride SA (K-DUR,KLOR-CON) 20 MEQ tablet TAKE TWO (2) TABLETS BY MOUTH DAILY. Patient taking differently: TAKE ONE TO TWO TABLETS TWO TO THREE TIMES DAILY 12/09/16  Yes Kefalas, Manon Hilding, PA-C  magic mouthwash SOLN TAKE ONE TEASPOONFUL (5ML) BY MOUTH FOUR TIMES DAILY FOR SORES IN MOUTH FROM CHEMOTHEROPY 02/15/16   [provider]  naproxen (NAPROSYN) 500 MG tablet Take 1 tablet (500 mg total) by mouth 2 (two) times daily. 05/14/16   Rogene Houston, MD  simvastatin (ZOCOR) 40 MG tablet Take 40 mg by mouth at bedtime. 02/06/16   [provider]  tiotropium (SPIRIVA) 18 MCG inhalation capsule Place 18 mcg into inhaler and inhale daily.    [provider]     Allergies:    No Known Allergies   Physical Exam:   Vitals  Blood pressure 139/78, pulse 75, temperature 97.8 F (36.6 C), temperature source Oral, resp. rate (!) 23, height 5\' 11"  (1.803 m), weight 83.5 kg (184 lb), SpO2 96 %.  1.  General: Appears in no acute distress  2. Psychiatric:  Intact judgement and  insight,  awake alert, oriented x 3.  3. Neurologic: No focal neurological deficits, all cranial nerves intact.Strength 5/5 all 4 extremities, sensation intact all 4 extremities, plantars down going.  4. Eyes :  anicteric  sclerae, moist conjunctivae with no lid lag. PERRLA.  5. ENMT:  Oropharynx clear with moist mucous membranes and good dentition  6. Neck:  supple, no cervical lymphadenopathy appriciated, No thyromegaly  7. Respiratory : Normal respiratory effort, bilateral rhonchi  8. Cardiovascular : RRR, no gallops, rubs or murmurs, no leg edema  9. Gastrointestinal:  Positive bowel sounds, abdomen soft, non-tender to palpation,no hepatosplenomegaly, no rigidity or guarding       10. Skin:  No cyanosis, normal texture and turgor, no rash, lesions or ulcers  11.Musculoskeletal:  Good muscle tone,  joints appear normal , no effusions,  normal range of motion    Data Review:    CBC  Recent Labs Lab 03/24/17 1512  WBC 5.0  HGB 13.7  HCT 41.8  PLT 199  MCV 99.3  MCH 32.5  MCHC 32.8  RDW 16.3*  LYMPHSABS 1.0  MONOABS 0.6  EOSABS 0.1  BASOSABS 0.0   ------------------------------------------------------------------------------------------------------------------  Chemistries   Recent Labs Lab 03/24/17 1512  NA 139  K 3.7  CL 98*  CO2 31  GLUCOSE 101*  BUN 22*  CREATININE 1.28*  CALCIUM 9.7   ------------------------------------------------------------------------------------------------------------------  ------------------------------------------------------------------------------------------------------------------ GFR: Estimated Creatinine Clearance: 54.7 mL/min (A) (by C-G formula based on SCr of 1.28 mg/dL (H)). Liver Function Tests: No results for input(s): AST, ALT, ALKPHOS, BILITOT, PROT, ALBUMIN in the last 168 hours. No results for input(s): LIPASE, AMYLASE in the last 168 hours. No results for input(s): AMMONIA in the last 168  hours. Coagulation Profile: No results for input(s): INR, PROTIME in the last 168 hours. Cardiac Enzymes:  Recent Labs Lab 03/24/17 1728  TROPONINI 9.05*   BNP (last 3 results) No results for input(s): PROBNP in the last 8760 hours. HbA1C: No results for input(s): HGBA1C in the last 72 hours. CBG: No results for input(s): GLUCAP in the last 168 hours. Lipid Profile: No results for input(s): CHOL, HDL, LDLCALC, TRIG, CHOLHDL, LDLDIRECT in the last 72 hours. Thyroid Function Tests: No results for input(s): TSH, T4TOTAL, FREET4, T3FREE, THYROIDAB in the last 72 hours. Anemia Panel: No results for input(s): VITAMINB12, FOLATE, FERRITIN, TIBC, IRON, RETICCTPCT in the last 72 hours.  --------------------------------------------------------------------------------------------------------------- Urine analysis: No results found for: COLORURINE, APPEARANCEUR, LABSPEC, PHURINE, GLUCOSEU, HGBUR, BILIRUBINUR, KETONESUR, PROTEINUR, UROBILINOGEN, NITRITE, LEUKOCYTESUR    Imaging Results:    Dg Chest 2 View  Result Date: 03/24/2017 CLINICAL DATA:  History of lung cancer.  Shortness of breath. EXAM: CHEST  2 VIEW COMPARISON:  01/29/2017 FINDINGS: Injectable Port-A-Cath is in stable position. Postsurgical changes from CABG, with re- demonstrated fractured sternal wires. The cardiac silhouette is normal. There is a relatively stable radiographically left suprahilar and upper lobe spiculated density. No evidence of airspace consolidation. Osseous structures are without acute abnormality. Soft tissues are grossly normal. IMPRESSION: Relatively stable radiographically left suprahilar and left upper lobe spiculated density. This may represent known post radiation changes, however focal recurrence cannot be excluded. Otherwise no evidence of focal airspace consolidation. Electronically Signed   By: Fidela Salisbury M.D.   On: 03/24/2017 15:39   Ct Head Wo Contrast  Result Date: 03/24/2017 CLINICAL  DATA:  Syncopal episode EXAM: CT HEAD WITHOUT CONTRAST TECHNIQUE: Contiguous axial images were obtained from the base of the skull through the vertex without intravenous contrast. COMPARISON:  MRI 03/19/2017 FINDINGS: Brain: No acute territorial infarction or intracranial mass is seen. Slightly complex right subdural collection, measuring 9 mm maximum thickness on coronal views and probably not significantly changed compared with MRI 03/19/2017. No  midline shift. Mild periventricular white matter small vessel ischemic changes. Atrophy. Stable ventricle size. Vascular: No hyperdense vessels. Carotid artery calcifications. Vertebral artery calcifications. Skull: No fracture or suspicious bone lesion. Sinuses/Orbits: No acute finding. Other: None IMPRESSION: 1. 9 mm thick subdural collection along the right convexity. No midline shift or significant mass effect. 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 No definite acute hemorrhage is seen at this time. 2. Mild white matter small vessel ischemic changes. Electronically Signed   By: Donavan Foil M.D.   On: 03/24/2017 19:03    My personal review of EKG: Rhythm NSR, no ST changes   Assessment & Plan:    Active Problems:   CAD, ARTERY BYPASS GRAFT   PVD   Small cell lung cancer ([place])   ACS (acute coronary syndrome) (H[facility])   Subdural hematoma ([place])   1. Acute coronary syndrome- patient has significant elevation of troponin 9.05, no chest pain no EKG changes. Due to subdural bleed no heparin recommended by neurosurgery. He will be started on aspirin 325 mg by mouth daily. Will transfer patient to [facility] for further evaluation by cardiology.Will cycle troponin every 6 hours 3. 2. Subdural bleed- CT scan confirms 9 mm thick subdural bleed along the right convexity, neurosurgery was consulted by ED physician, no surgical need at this time.  Neurosurgery will evaluate patient when patient reaches [facility]. 3. Acute bronchitis- patient presenting with bilateral rhonchi, he does have history of COPD. Will start Solu-Medrol 40 mg IV every 6 hours, DuoNeb nebulizers every 6 hours. Mucinex 1 tablet by mouth twice a day. 4. Syncope- happened over a month ago ,patient was evaluated by cardiology today, patient was supposed to get even monitor as well as echocardiogram. Will order 2-D echo, cardiology follow patient at [facility]. 5. CAD-status post CABG 20 years ago, continue aspirin, Zocor. 6. Small cell lung cancer- treated, status post chemoradiation therapy. Followed by oncology as outpatient. No metastatic disease as per oncologist note. 7. ? CHF-patient on high-dose Lasix 80 mg daily, will continue with Lasix. Obtain echocardiogram in a.m. BNP elevated at 620. Cardiology to follow with further recommendations.   Called and discussed with Dr. Loleta Books, who has accepted the patient in transfer at [facility].  DVT Prophylaxis-   SCDs   AM Labs Ordered, also please review Full Orders  Family Communication: Admission, patients condition and plan of care including tests being ordered have been discussed with the patient and his family members at bedside who indicate understanding and agree with the plan and Code Status.  Code Status:  Full code  Admission status: Inpatient    Time spent in minutes : 60 minutes   Zamorah Ailes S M.D on 03/24/2017 at 8:27 PM  Between 7am to 7pm - Pager - [phone]. After 7pm go to [email or web address] - password [facility]  [facility] - Office  [phone]

## 2017-03-24 NOTE — ED Notes (Signed)
Pt comes in from cardiology office. Pt was there because he had had recent sob. None at present. They suggest [place] get cbc, bmp, bnp, and chest xray.

## 2017-03-24 NOTE — Progress Notes (Addendum)
Cardiology Office Note    Date:  03/24/2017  ID:  Manuel Rivera, DOB 1946-09-23, MRN [id number] PCP:  Rory Percy, MD  Cardiologist:  New, reviewed with Dr. Bronson Ing   Chief Complaint: passed out a month ago  History of Present Illness:  Manuel Rivera is a 71 y.o. male with history of small cell lung CA (dx 2014 tx with rad/chemo, recent favorable note), CAD s/p remote CABG 1997, COPD, hypothyroidism, HLD, CKD III per labs, memory loss, LE PVD (remotely followed by Dr. Scot Dock, pt denies intervention), prior tobacco abuse who presents for evaluation of syncope at the request of Dr. Tasia Catchings with oncology. OV note 02/2009 referenced echo with LVEF 50-55% with mild MR. He reports prior cardiology follow up with a Dr. Candace Gallus but not for quite some time. He was also remotely seen by Dr. Ron Parker. Most recent labs 10/2016 showed K 3.4, Cr 1.47, Hgb 14.7. No recent cath/echo/nuc available for review.  The patient presents with his wife to discuss multiple issues: - Syncope: about 1 month ago he was working out in the sun on the ladder, screwing a screw into a birdhouse. Without warning he fell off the ladder and passed out. When he awoke he was on the ground but does not know how long he was out for. Episode unwitnessed. Felt groggy afterwards. Rested the rest of the day without acute symptom. No preceding palpitations, chest pain, dyspnea, sweating or blurry vision. Previously was taken off BP meds due to his BP dropping too low - brings in home log with recent BPs running [id number] systolic. BP in office today 118/78 in R arm, 112/80 in L arm. He does report a history of occasional palpitations about 1x week lasting a few seconds but not acutely related to this syncopal spell. Also reports unrelated episode where he tripped and fell in garden several weeks ago. - Dr. Tasia Catchings ordered a MRI of the head which showed chronic appearing subdural hematoma/subdural hygroma on the right, 8-73mm, not present on prior  study, more recent blood products at right frontal vertex, no metastatic disease or mass effect. Wife is extremely upset because she feels they weren't really given any direction on what to do with this other than to follow up with cardiology. During our office visit she was actually awaiting a call back from primary care who recommended urgent neurology evaluation in the ED. - Reports history of intermittent coughing, has noticed orthopnea x 1 week. No significant weight gain. If anything, he has been losing weight. No chest pain, LEE, abdominal girth increase.   Past Medical History:  Diagnosis Date  . Anxiety   . Arthritis   . CAD in native artery    a. s/p CABG 1997.  . Cancer [facility]) 2014   Small Cell Lung Cancer  . CKD (chronic kidney disease), stage III    by labs  . COPD (chronic obstructive pulmonary disease) ([place])   . Hyperlipidemia   . Hypothyroidism   . Lipoma   . Memory loss   . Mild mitral regurgitation   . Panic attack   . Peripheral vascular disease, unspecified ([place])   . Small cell lung cancer ([place]) 06/19/2016  . Systolic murmur     Past Surgical History:  Procedure Laterality Date  . basal cell cancer removed from right leg    . COLONOSCOPY    . COLONOSCOPY N/A 05/09/2016   Procedure: COLONOSCOPY;  Surgeon: Rogene Houston, MD;  Location: [facility];  Service: Endoscopy;  Laterality: N/A;  1030  . CORONARY ARTERY BYPASS GRAFT    . HEMORRHOIDECTOMY WITH HEMORRHOID BANDING      Current Medications: Current Outpatient Prescriptions  Medication Sig Dispense Refill  . ADVAIR DISKUS 100-50 MCG/DOSE AEPB Inhale 1 puff into the lungs 2 (two) times daily.  12  . albuterol (PROVENTIL HFA;VENTOLIN HFA) 108 (90 Base) MCG/ACT inhaler Inhale 1-2 puffs into the lungs every 6 (six) hours as needed for wheezing or shortness of breath.    . allopurinol (ZYLOPRIM) 300 MG tablet Take 300 mg by mouth daily.  3  . ALPRAZolam (XANAX) 0.5 MG tablet Take 0.5 mg by mouth at bedtime.   5  . beta carotene 25000 UNIT capsule Take by mouth.    . calcium carbonate (OSCAL) 1500 (600 Ca) MG TABS tablet Take 600 mg of elemental calcium by mouth 2 (two) times daily with a meal.    . colchicine 0.6 MG tablet Take 0.6 mg by mouth 2 (two) times daily.  12  . furosemide (LASIX) 80 MG tablet Take 80 mg by mouth daily.  3  . HYDROcodone-acetaminophen (NORCO/VICODIN) 5-325 MG tablet Take 1 tablet by mouth every 4 (four) hours as needed for moderate pain. 30 tablet 0  . levothyroxine (SYNTHROID, LEVOTHROID) 50 MCG tablet Take 50 mcg by mouth every morning.  3  . magic mouthwash SOLN TAKE ONE TEASPOONFUL (5ML) BY MOUTH FOUR TIMES DAILY FOR SORES IN MOUTH FROM CHEMOTHEROPY  99  . Multiple Vitamin (MULTIVITAMIN WITH MINERALS) TABS tablet Take 1 tablet by mouth daily.    . naproxen (NAPROSYN) 500 MG tablet Take 1 tablet (500 mg total) by mouth 2 (two) times daily.    . potassium chloride SA (K-DUR,KLOR-CON) 20 MEQ tablet TAKE TWO (2) TABLETS BY MOUTH DAILY. 60 tablet 1  . simvastatin (ZOCOR) 40 MG tablet Take 40 mg by mouth at bedtime.  3  . tiotropium (SPIRIVA) 18 MCG inhalation capsule Place 18 mcg into inhaler and inhale daily.     No current facility-administered medications for this visit.      Allergies:   Patient has no known allergies.   Social History   Social History  . Marital status: Married    Spouse name: N/A  . Number of children: N/A  . Years of education: N/A   Social History Main Topics  . Smoking status: Former Smoker    Packs/day: 3.00    Years: 50.00    Types: Cigarettes    Quit date: 05/09/2012  . Smokeless tobacco: Never Used  . Alcohol use No  . Drug use: No  . Sexual activity: Not Currently   Other Topics Concern  . None   Social History Narrative  . None     Family History:  Family History  Problem Relation Age of Onset  . CAD Mother        MI 2    ROS:   Please see the history of present illness. All other systems are reviewed and  otherwise negative.    PHYSICAL EXAM:   VS:  BP 112/80 (BP Location: Left Arm)   Pulse 73   Ht 5\' 11"  (1.803 m)   Wt 184 lb (83.5 kg)   SpO2 98%   BMI 25.66 kg/m   BMI: Body mass index is 25.66 kg/m. GEN: Well nourished, well developed WM, in no acute distress  HEENT: normocephalic, atraumatic Neck: no JVD, carotid bruits, or masses Cardiac: RRR; no murmurs, rubs, or gallops, no edema  Respiratory: diffusely coarse  particularly at bases, no overt wheezes, rales or rhonchi, normal work of breathing GI: soft, nontender, nondistended, + BS MS: no deformity or atrophy  Skin: warm and dry, no rash Neuro:  Alert and Oriented x 3, Strength and sensation are intact, follows commands Psych: euthymic mood, full affect. Tangential at times however  Wt Readings from Last 3 Encounters:  03/24/17 184 lb (83.5 kg)  03/11/17 189 lb 9.6 oz (86 kg)  12/04/16 199 lb (90.3 kg)      Studies/Labs Reviewed:   EKG:  EKG was ordered today and personally reviewed by me and demonstrates MSR 70bpm, rare PVCs, incomplete LBBB, nonspecific TW changes, TWI III, flattening avF, V5-V6  Recent Labs: 11/15/2016: ALT 19; BUN 23; Creatinine, Ser 1.47; Hemoglobin 14.1; Platelets 154; Potassium 3.4; Sodium 139   Lipid Panel No results found for: CHOL, TRIG, HDL, CHOLHDL, VLDL, LDLCALC, LDLDIRECT  Additional studies/ records that were reviewed today include: Summarized above.    ASSESSMENT & PLAN:   This patient's case was discussed in depth with Dr. Bronson Ing. The plan below was formulated per our discussion.  1. Syncope - concerning for arrhythmia given "lights-out" nature - however, the patient's wife also brings in a copy of a brain scan which revealed interim development of blood products in his brain (scan done 03/19/17). She states they were instructed to discuss this further at cardiology visit today. She contacted his PCP earlier who called back during our visit today and advised he proceed to ER  for further neurologic evaluation. In the ED, would recommend standard labs for syncope workup (CBC, BMET, as well as Mg and TSH). Will tentatively plan for 30-day event monitor as well as echocardiogram to further evaluate syncope from cardiac standpoint. He does report he was working out in the sun that day so labile BP changes could be a consideration, but warrants eval as above. His BP is low-normal today so will not make any changes. 2. Orthopnea - original plan was to obtain OP labs and CXR, but since the patient was advised to proceed to ER by PCP to evaluate subdural hematoma, would recommend he get these studies in the ED (CXR, BMET, CBC, BNP). Cardiology will be available to discuss if needed. 3. CAD - await echocardiogram and event monitor before deciding whether ischemic workup is necessary. He's not having any chest pain or SOB other than orthopnea. Orthopnea could be represent a component of angina decubitus but need to await workup of the above before committing to ischemic eval (not a candidate for ASA until cleared by neurology due to subdural hematoma). 4. Mild mitral regurgitation - will arrange f/u echocardiogram. 5. Hypokalemia - recommend repeat labs in ED to include BMET and Mg given #1. 6. Ongoing weight loss - will need attention to this by PCP and oncology.  Disposition: F/u with Dr. Bronson Ing pending completion of above event monitor and echocardiogram.  Medication Adjustments/Labs and Tests Ordered: Current medicines are reviewed at length with the patient today.  Concerns regarding medicines are outlined above. Medication changes, Labs and Tests ordered today are summarized above and listed in the Patient Instructions accessible in Encounters.   Signed, Charlie Pitter, PA-C  03/24/2017 1:51 PM    [place] Location in [place]. [place], [place] [address] Ph: [phone]; Fax [phone]

## 2017-03-24 NOTE — ED Notes (Signed)
CRITICAL VALUE ALERT  Critical Value: troponin 9.05  Date & Time Notied:  03/24/2017 1820  Provider Notified: Dr. Thurnell Garbe  Orders Received/Actions taken: 1820

## 2017-03-24 NOTE — Patient Instructions (Addendum)
When you go to the ER, you need to have the following items checked out: - Your recent brain scan showed blood in your brain. Your primary care doctor has advised you go to the ER to have this urgently evaluated by neurology. - At your visit with cardiology today, you reported 1 week history of orthopnea (shortness of breath when lying down). Since you are going urgently to the ER, we would recommend further evaluation of this with a chest x-ray and labwork including BMET, CBC, and BNP at the discretion of the ER doctor.     Your physician has requested that you have an echocardiogram. Echocardiography is a painless test that uses sound waves to create images of your heart. It provides your doctor with information about the size and shape of your heart and how well your heart's chambers and valves are working. This procedure takes approximately one hour. There are no restrictions for this procedure.     Your physician has recommended that you wear an event monitor. Event monitors are medical devices that record the heart's electrical activity. Doctors most often [place] these monitors to diagnose arrhythmias. Arrhythmias are problems with the speed or rhythm of the heartbeat. The monitor is a small, portable device. You can wear one while you do your normal daily activities. This is usually used to diagnose what is causing palpitations/syncope (passing out).

## 2017-03-24 NOTE — ED Triage Notes (Signed)
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 Pt went ot PCP last week because he had a syncopal episode around 1 month ago. Pt had a head CT done on Friday. His doctor noticed the result today and told them to come here. Pt has bleeding area on brain. Pt acting appropriately in triage.  Pt states he has felt fatigued.

## 2017-03-24 NOTE — Progress Notes (Signed)
Patient discussed with nursing and with Cardiology.  Patient comfortable, no new chest pain.    No interventions tonight.  Cycle enzymes, echo planned for tomorrow. Neurosurgery to evaluate chronic SDH tomorrow. Bed rest.

## 2017-03-24 NOTE — ED Notes (Signed)
Pt updated on wait.  °

## 2017-03-24 NOTE — Progress Notes (Signed)
Pt arrived to unit via carelink from [facility].  Pt a/o, VSS, oriented to unit.  Pt denies chest pain at this time.  MD Danford paged and made aware of patient's arrival.  RN will continue to monitor.  Claudette Stapler, RN

## 2017-03-25 ENCOUNTER — Inpatient Hospital Stay (HOSPITAL_COMMUNITY): Payer: PPO

## 2017-03-25 DIAGNOSIS — R778 Other specified abnormalities of plasma proteins: Secondary | ICD-10-CM

## 2017-03-25 DIAGNOSIS — I2510 Atherosclerotic heart disease of native coronary artery without angina pectoris: Secondary | ICD-10-CM

## 2017-03-25 DIAGNOSIS — I340 Nonrheumatic mitral (valve) insufficiency: Secondary | ICD-10-CM

## 2017-03-25 DIAGNOSIS — R55 Syncope and collapse: Secondary | ICD-10-CM

## 2017-03-25 DIAGNOSIS — I350 Nonrheumatic aortic (valve) stenosis: Secondary | ICD-10-CM

## 2017-03-25 DIAGNOSIS — R7989 Other specified abnormal findings of blood chemistry: Secondary | ICD-10-CM

## 2017-03-25 DIAGNOSIS — R748 Abnormal levels of other serum enzymes: Secondary | ICD-10-CM

## 2017-03-25 LAB — ECHOCARDIOGRAM COMPLETE
AOASC: 33 cm
AVLVOTPG: 4 mmHg
AVPHT: 516 ms
CHL CUP DOP CALC LVOT VTI: 19.1 cm
CHL CUP LVOT MV VTI INDEX: 0.78 cm2/m2
CHL CUP STROKE VOLUME: 56 mL
E decel time: 211 msec
E/e' ratio: 6.97
FS: 21 % — AB (ref 28–44)
Height: 71 in
IVS/LV PW RATIO, ED: 1
LA diam index: 2.44 cm/m2
LA vol A4C: 30.1 ml
LA vol index: 17.3 mL/m2
LASIZE: 50 mm
LAVOL: 35.5 mL
LDCA: 2.84 cm2
LEFT ATRIUM END SYS DIAM: 50 mm
LV PW d: 12 mm — AB (ref 0.6–1.1)
LV SIMPSON'S DISK: 54
LV dias vol: 104 mL (ref 62–150)
LV e' LATERAL: 9.57 cm/s
LV sys vol index: 23 mL/m2
LVDIAVOLIN: 51 mL/m2
LVEEAVG: 6.97
LVEEMED: 6.97
LVOT MV VTI: 1.6
LVOT SV: 54 mL
LVOT diameter: 19 mm
LVOT peak vel: 104 cm/s
LVSYSVOL: 48 mL (ref 21–61)
MRPISAEROA: 0.04 cm2
MV Annulus VTI: 33.9 cm
MV Dec: 211
MV M vel: 80.4
MV VTI: 205 cm
MV pk E vel: 66.7 m/s
MVAP: 3.38 cm2
MVG: 3 mmHg
MVSPHT: 62 ms
RV LATERAL S' VELOCITY: 7.29 cm/s
TAPSE: 15.2 mm
TDI e' lateral: 9.57
TDI e' medial: 5
Weight: 2987.2 oz

## 2017-03-25 LAB — COMPREHENSIVE METABOLIC PANEL
ALBUMIN: 3.3 g/dL — AB (ref 3.5–5.0)
ALT: 33 U/L (ref 17–63)
ANION GAP: 13 (ref 5–15)
AST: 75 U/L — ABNORMAL HIGH (ref 15–41)
Alkaline Phosphatase: 89 U/L (ref 38–126)
BUN: 24 mg/dL — ABNORMAL HIGH (ref 6–20)
CO2: 28 mmol/L (ref 22–32)
Calcium: 9.4 mg/dL (ref 8.9–10.3)
Chloride: 98 mmol/L — ABNORMAL LOW (ref 101–111)
Creatinine, Ser: 1.39 mg/dL — ABNORMAL HIGH (ref 0.61–1.24)
GFR calc Af Amer: 56 mL/min — ABNORMAL LOW (ref >60)
GFR calc non Af Amer: 49 mL/min — ABNORMAL LOW (ref >60)
GLUCOSE: 139 mg/dL — AB (ref 65–99)
POTASSIUM: 4 mmol/L (ref 3.5–5.1)
SODIUM: 139 mmol/L (ref 135–145)
Total Bilirubin: 1 mg/dL (ref 0.3–1.2)
Total Protein: 6.8 g/dL (ref 6.5–8.1)

## 2017-03-25 LAB — CBC
HCT: 38.9 % — ABNORMAL LOW (ref 39.0–52.0)
Hemoglobin: 12.7 g/dL — ABNORMAL LOW (ref 13.0–17.0)
MCH: 31.9 pg (ref 26.0–34.0)
MCHC: 32.6 g/dL (ref 30.0–36.0)
MCV: 97.7 fL (ref 78.0–100.0)
PLATELETS: 196 10*3/uL (ref 150–400)
RBC: 3.98 MIL/uL — ABNORMAL LOW (ref 4.22–5.81)
RDW: 15.9 % — AB (ref 11.5–15.5)
WBC: 3.6 10*3/uL — ABNORMAL LOW (ref 4.0–10.5)

## 2017-03-25 LAB — TROPONIN I
TROPONIN I: 6.09 ng/mL — AB (ref <0.03)
TROPONIN I: 5.5 ng/mL — AB (ref <0.03)
Troponin I: 7.01 ng/mL (ref <0.03)

## 2017-03-25 MED ORDER — IPRATROPIUM-ALBUTEROL 0.5-2.5 (3) MG/3ML IN SOLN
3.0000 mL | Freq: Four times a day (QID) | RESPIRATORY_TRACT | Status: DC | PRN
Start: 2017-03-25 — End: 2017-03-28
  Administered 2017-03-26 (×2): 3 mL via RESPIRATORY_TRACT
  Filled 2017-03-25 (×3): qty 3

## 2017-03-25 MED ORDER — PERFLUTREN LIPID MICROSPHERE
1.0000 mL | INTRAVENOUS | Status: AC | PRN
  Administered 2017-03-25: 2 mL via INTRAVENOUS
  Filled 2017-03-25: qty 10

## 2017-03-25 MED ORDER — PERFLUTREN LIPID MICROSPHERE
INTRAVENOUS | Status: AC
  Administered 2017-03-25: 2 mL via INTRAVENOUS
  Filled 2017-03-25: qty 10

## 2017-03-25 MED ORDER — ENSURE ENLIVE PO LIQD
237.0000 mL | Freq: Two times a day (BID) | ORAL | Status: DC
  Administered 2017-03-26 – 2017-03-28 (×3): 237 mL via ORAL
  Filled 2017-03-25 (×2): qty 237

## 2017-03-25 NOTE — Progress Notes (Signed)
Mr. 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  He had a syncopal episode 3 weeks ago while standing on a ladder.  There were no preceding symptoms.  He has no history of recent chest pain.  Troponin was elevated to 9 and trending down.  Echo is unremarkable.  We will plan for [facility] tomorrow given the degree of rise in the troponin.  30 day event monitor at discharge.   Arlee Santosuosso C. Oval Linsey, MD, [facility] 03/25/2017 2:56 PM

## 2017-03-25 NOTE — Progress Notes (Signed)
PROGRESS NOTE    Manuel Rivera  EQA:[id number] DOB: 07-05-1946 DOA: 03/24/2017 PCP: Rory Percy, MD   Chief Complaint  Patient presents with  . 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 While at the office, patient's primary care physician called relating information of MRI done on 03/09/2017 showing a subdural hematoma and requested patient go to emergency department. Patient presented to [facility] emergency department, then transferred to [facility] for neurosurgery and cardiology consultations. 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 -Troponin 9.05 and trending down to 5.5 -Echocardiogram EF 97-98%, grade 1 diastolic dysfunction, mild mitral regurgitation, mild aortic valve regurgitation -Cardiology consulted and appreciated -Continue statin, aspirin -Pending further recommendations from cardiology  Subdural hematoma -s/p fall which occurred approximately one month ago, fell off of a ladder -MRI brain done prior to admission, showed small chronic right convexity subdural hematoma with minimal mass effect -CT head: 9 mm thick subdural collection along the right convexity, no midline shift or mass effect. -Neurosurgery consult and appreciated, feels patient has no indications for surgical intervention at this time. Recommended outpatient follow-up with Dr. Annette Stable in 2 weeks with repeat CT head.  Acute bronchitis -Continue Solu-Medrol, Mucinex, nebulizer treatments -Patient recently diagnosed and treated for pneumonia 3 weeks ago  Syncope -Cardiology consult and appreciated, patient was supposed to have an event monitor approximately one month ago -Echocardiogram as  above -Will consult PT and OT  Acute diastolic CHF -Echocardiogram as above -Continue Lasix -Monitor intake and output, daily weights  Orthopnea -Possibly related to acute bronchitis/chest congestion and recent pneumonia versus CHF component  Hypothyroidism -Continue Synthroid  DVT Prophylaxis  SCDs  Code Status: Full  Family Communication: Wife at bedside  Disposition Plan: Admitted. Dispo TBD  Consultants Cardiology Neurosurgery  Procedures  Echocardiogram  Antibiotics   Anti-infectives    None      Subjective:   Manuel Rivera seen and examined today.   Patient currently denies any chest pain, abdominal pain, nausea or vomiting, dizziness, headache, numbness or weakness. Does complain of productive cough with shortness of breath.  Objective:   Vitals:   03/24/17 2248 03/24/17 2254 03/25/17 0452 03/25/17 1240  BP:  (!) 146/81 114/70 139/69  Pulse:  92 88 71  Resp:  18 17 18   Temp:  97.7 F (36.5 C) 98 F (36.7 C) 98 F (36.7 C)  TempSrc:  Oral Oral Oral  SpO2:  96% 94% 92%  Weight: 84.7 kg (186 lb 11.2 oz)     Height: 5\' 11"  (1.803 m)       Intake/Output Summary (Last 24 hours) at 03/25/17 1416 Last data filed at 03/25/17 1315  Gross per 24 hour  Intake               20 ml  Output                0 ml  Net               20 ml   Filed Weights   03/24/17 1437 03/24/17 2248  Weight: 83.5 kg (184 lb) 84.7 kg (186 lb 11.2 oz)    Exam  General: Well developed, well nourished, NAD, appears stated age  31: NCAT,  mucous membranes moist.   Cardiovascular: S1 S2 auscultated, no rubs, murmurs or gallops. Regular rate and rhythm.  Respiratory: +rhonchi/crackles lung bases, occ cough  Abdomen: Soft, nontender, nondistended, + bowel sounds  Extremities: warm dry without cyanosis clubbing or edema  Neuro: AAOx3, nonfocal  Psych: Normal affect and demeanor with intact judgement and insight   Data Reviewed: I have personally reviewed following  labs and imaging studies  CBC:  Recent Labs Lab 03/24/17 1512 03/25/17 0543  WBC 5.0 3.6*  NEUTROABS 3.4  --   HGB 13.7 12.7*  HCT 41.8 38.9*  MCV 99.3 97.7  PLT 199 540   Basic Metabolic Panel:  Recent Labs Lab 03/24/17 1512 03/25/17 0543  NA 139 139  K 3.7 4.0  CL 98* 98*  CO2 31 28  GLUCOSE 101* 139*  BUN 22* 24*  CREATININE 1.28* 1.39*  CALCIUM 9.7 9.4   GFR: Estimated Creatinine Clearance: 50.4 mL/min (A) (by C-G formula based on SCr of 1.39 mg/dL (H)). Liver Function Tests:  Recent Labs Lab 03/25/17 0543  AST 75*  ALT 33  ALKPHOS 89  BILITOT 1.0  PROT 6.8  ALBUMIN 3.3*   No results for input(s): LIPASE, AMYLASE in the last 168 hours. No results for input(s): AMMONIA in the last 168 hours. Coagulation Profile: No results for input(s): INR, PROTIME in the last 168 hours. Cardiac Enzymes:  Recent Labs Lab 03/24/17 1728 03/24/17 2359 03/25/17 0543 03/25/17 1314  TROPONINI 9.05* 7.01* 6.09* 5.50*   BNP (last 3 results) No results for input(s): PROBNP in the last 8760 hours. HbA1C: No results for input(s): HGBA1C in the last 72 hours. CBG: No results for input(s): GLUCAP in the last 168 hours. Lipid Profile: No results for input(s): CHOL, HDL, LDLCALC, TRIG, CHOLHDL, LDLDIRECT in the last 72 hours. Thyroid Function Tests: No results for input(s): TSH, T4TOTAL, FREET4, T3FREE, THYROIDAB in the last 72 hours. Anemia Panel: No results for input(s): VITAMINB12, FOLATE, FERRITIN, TIBC, IRON, RETICCTPCT in the last 72 hours. Urine analysis: No results found for: COLORURINE, APPEARANCEUR, LABSPEC, PHURINE, GLUCOSEU, HGBUR, BILIRUBINUR, KETONESUR, PROTEINUR, UROBILINOGEN, NITRITE, LEUKOCYTESUR Sepsis Labs: @LABRCNTIP (procalcitonin:4,lacticidven:4)  )No results found for this or any previous visit (from the past 240 hour(s)).    Radiology Studies: Dg Chest 2 View  Result Date: 03/24/2017 CLINICAL DATA:  History of lung cancer.  Shortness of  breath. EXAM: CHEST  2 VIEW COMPARISON:  01/29/2017 FINDINGS: Injectable Port-A-Cath is in stable position. Postsurgical changes from CABG, with re- demonstrated fractured sternal wires. The cardiac silhouette is normal. There is a relatively stable radiographically left suprahilar and upper lobe spiculated density. No evidence of airspace consolidation. Osseous structures are without acute abnormality. Soft tissues are grossly normal. IMPRESSION: Relatively stable radiographically left suprahilar and left upper lobe spiculated density. This may represent known post radiation changes, however focal recurrence cannot be excluded. Otherwise no evidence of focal airspace consolidation. Electronically Signed   By: Fidela Salisbury M.D.   On: 03/24/2017 15:39   Ct Head Wo Contrast  Result Date: 03/24/2017 CLINICAL DATA:  Syncopal episode EXAM: CT HEAD WITHOUT CONTRAST TECHNIQUE: Contiguous axial images were obtained from the base of the skull through the vertex without intravenous contrast. COMPARISON:  MRI 03/19/2017 FINDINGS: Brain: No acute territorial infarction or intracranial mass is seen. Slightly complex right subdural collection, measuring 9 mm maximum thickness on coronal views and probably not significantly changed compared with MRI 03/19/2017. No midline shift. Mild periventricular white matter small vessel ischemic changes. Atrophy. Stable ventricle size. Vascular:  No hyperdense vessels. Carotid artery calcifications. Vertebral artery calcifications. Skull: No fracture or suspicious bone lesion. Sinuses/Orbits: No acute finding. Other: None IMPRESSION: 1. 9 mm thick subdural collection along the right convexity. No midline shift or significant mass effect. 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 No definite acute hemorrhage is seen at this time. 2. Mild white matter small vessel ischemic  changes. Electronically Signed   By: Donavan Foil M.D.   On: 03/24/2017 19:03     Scheduled Meds: . allopurinol  300 mg Oral Daily  . ALPRAZolam  0.5 mg Oral QHS  . aspirin  325 mg Oral Daily  . colchicine  0.6 mg Oral BID  . furosemide  80 mg Oral Daily  . levothyroxine  50 mcg Oral QAC breakfast  . methylPREDNISolone (SOLU-MEDROL) injection  40 mg Intravenous Q6H  . simvastatin  40 mg Oral QHS  . sodium chloride flush  10-40 mL Intracatheter Q12H   Continuous Infusions: . sodium chloride       LOS: 1 day   Time Spent in minutes   30 minutes  Thresea Doble D.O. on 03/25/2017 at 2:16 PM  Between 7am to 7pm - Pager - [phone]  After 7pm go to [email or web address] - password [email or web address]  And look for the night coverage person covering for me after hours  [facility] Office  [phone]

## 2017-03-25 NOTE — Evaluation (Addendum)
Physical Therapy Evaluation Patient Details Name: Manuel Rivera MRN: [id number] DOB: April 05, 1946 Today's Date: 03/25/2017   History of Present Illness  Patient is a 71 yo male admitted 03/24/17 with SDH.  Patient had fallen off ladder (? syncope on ladder) a few weeks prior to admission.  OP testing showed SDH.  MD instructed patient to go to ED for admission.  Patient also with ? ACS - elevated troponin 9.05.  Patient also with acute bronchitis.     PMH:  lung CA, CAD, MI, CABG, COPD, HLD, CKD, PVD, anxiety, arthritis, memory loss, CHF  Clinical Impression  Patient is functioning at Mod I to supervision level with mobility and gait.  Good balance during gait.  No further acute PT needs - PT will sign off.  Encouraged ambulation with supervision of nsg or family    Follow Up Recommendations No PT follow up;Supervision for mobility/OOB    Equipment Recommendations  None recommended by PT    Recommendations for Other Services       Precautions / Restrictions Precautions Precautions: None Restrictions Weight Bearing Restrictions: No      Mobility  Bed Mobility Overal bed mobility: Modified Independent             General bed mobility comments: Increased time.    Transfers Overall transfer level: Modified independent Equipment used: None             General transfer comment: Increased time with transfers.  Patient with good static balance.  Ambulation/Gait Ambulation/Gait assistance: Supervision Ambulation Distance (Feet): 200 Feet Assistive device: None Gait Pattern/deviations: Step-through pattern;Drifts right/left   Gait velocity interpretation: at or above normal speed for age/gender General Gait Details: Patient ambulates very quickly - makes patient drift side to side.  More steady with slower gait.  Patient with difficulty with Bil knees, making his gait less smooth.  No loss of balance during gait.  Stairs            Wheelchair Mobility     Modified Rankin (Stroke Patients Only)       Balance Overall balance assessment: Needs assistance         Standing balance support: No upper extremity supported Standing balance-Leahy Scale: Good                               Pertinent Vitals/Pain Pain Assessment: No/denies pain    Home Living Family/patient expects to be discharged to:: Private residence Living Arrangements: Spouse/significant other;Children (Patient and his wife care for disabled daughter) Available Help at Discharge: Family;Available 24 hours/day Type of Home: House Home Access: Ramped entrance     Home Layout: One level Home Equipment: None      Prior Function Level of Independence: Independent         Comments: Drives     Hand Dominance   Dominant Hand: Right    Extremity/Trunk Assessment   Upper Extremity Assessment Upper Extremity Assessment: Overall WFL for tasks assessed    Lower Extremity Assessment Lower Extremity Assessment: RLE deficits/detail;LLE deficits/detail RLE Deficits / Details: Knee instability.  Strength grossly 4/5 LLE Deficits / Details: Knee instability.  Strength grossly 4/5       Communication   Communication: No difficulties  Cognition Arousal/Alertness: Awake/alert Behavior During Therapy: WFL for tasks assessed/performed Overall Cognitive Status: Within Functional Limits for tasks assessed  General Comments      Exercises     Assessment/Plan    PT Assessment Patent does not need any further PT services  PT Problem List         PT Treatment Interventions      PT Goals (Current goals can be found in the Care Plan section)  Acute Rehab PT Goals PT Goal Formulation: All assessment and education complete, DC therapy    Frequency     Barriers to discharge        Co-evaluation               AM-PAC PT "6 Clicks" Daily Activity  Outcome Measure Difficulty  turning over in bed (including adjusting bedclothes, sheets and blankets)?: None Difficulty moving from lying on back to sitting on the side of the bed? : None Difficulty sitting down on and standing up from a chair with arms (e.g., wheelchair, bedside commode, etc,.)?: None Help needed moving to and from a bed to chair (including a wheelchair)?: None Help needed walking in hospital room?: A Little Help needed climbing 3-5 steps with a railing? : A Little 6 Click Score: 22    End of Session Equipment Utilized During Treatment: Gait belt Activity Tolerance: Patient tolerated treatment well Patient left: in bed;with call bell/phone within reach;with family/visitor present Nurse Communication: Mobility status (Encouraged ambulation with supervision.  PT to sign off.) PT Visit Diagnosis: Other abnormalities of gait and mobility (R26.89);History of falling (Z91.81)    Time: 6378-5885 PT Time Calculation (min) (ACUTE ONLY): 40 min   Charges:   PT Evaluation $PT Eval Moderate Complexity: 1 Procedure PT Treatments $Gait Training: 23-37 mins   PT G Codes:        Carita Pian. Sanjuana Kava, [facility] Acute Rehab Services Pager [place] 03/25/2017, 8:17 PM

## 2017-03-25 NOTE — Consult Note (Signed)
Cardiology Consultation:   Patient ID: Manuel Rivera; [id number]; [birth year]   Admit date: 03/24/2017 Date of Consult: 03/25/2017  Primary Care Provider: Rory Percy, MD Primary Cardiologist: Dr. Bronson Rivera Primary Electrophysiologist:  none   Patient Profile:   Manuel Rivera is a 71 y.o. male with a hx of CAD and MI who is being seen today for the evaluation of elevated troponin at the request of Dr. Loleta Rivera.  History of Present Illness:   Manuel Rivera with history of small cell lung CA (dx 2014 tx with rad/chemo, recent favorable note), CAD s/p remote CABG 1997, COPD, hypothyroidism, HLD, CKD III per labs, memory loss, LE PVD (remotely followed by Dr. Scot Rivera, pt denies intervention), prior tobacco abuse who presented yesterday to Cardiology with Manuel Copa, PA for evaluation of syncope at the request of Dr. Tasia Rivera with oncology. OV note 02/2009 referenced echo with LVEF 50-55% with mild MR. He reports prior cardiology follow up with a Dr. Theadora Rivera but not for quite some time. He was also remotely seen by Dr. Ron Rivera. Most recent labs 10/2016 showed K 3.4, Cr 1.47, Hgb 14.7. No recent cath/echo/nuc available for review.  The patient presented with his wife to discuss multiple issues as outlined in detail by Manuel Copa, PA as follows: - Syncope: about 1 month ago he was working out in the sun on the ladder, screwing a screw into a birdhouse. Without warning he fell off the ladder and passed out. He states that he actually thinks he passed out while on the ladder and then fell to the ground.  When he awoke he was on the ground but does not know how long he was out for. Episode was unwitnessed. Felt groggy afterwards. Rested the rest of the day without acute symptom. No preceding palpitations, chest pain, dyspnea, sweating or blurry vision. Previously was taken off BP meds due to his BP dropping too low - he brought in home log with recent BPs running [id number] systolic. BP in office yesterday  was 118/78 in R arm, 112/80 in L arm. He does report a history of occasional palpitations about 1x week lasting a few seconds but not acutely related to this syncopal spell. Also reports unrelated episode where he tripped and fell in garden several weeks ago.  Dr. 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 The son says that the oncologist told him to take it easy.  During his office visit yesterday with Manuel Copa, PA she was actually awaiting a call back from primary care who recommended urgent neurology evaluation in the ED so they proceeded to go the ER after the OV and subsequently Neurosurgery was called and he was told to go to [facility] for direct admission to the [facility] service for evaluation.   He is now admitted to [facility] and Cardiology was asked to see again due to elevated troponin of 9 noted at outside hospital.  He denies any chest pain or pressure.  He has had SOB for a few weeks but his son says that he had PNA a few weeks ago and has had a persistent cough and SOB with mucous production.  He denies any fever or chills.  He denies any LE edema or weight gain.     Past Medical History:  Diagnosis Date  . Anginal pain ([place]) 1997  .  Anxiety   . Arthritis    "hands, knees, back" (03/24/2017)  . CAD in native artery    a. s/p CABG 1997.  Marland Kitchen CHF (congestive heart failure) ([place])   . CKD (chronic kidney disease), stage III    by labs  . COPD (chronic obstructive pulmonary disease) ([place])   . Depression   . Gout   . Hyperlipidemia   . Hypertension   . Hypothyroidism   . Lipoma    "left thigh; never treated; it's a fatty tumor" (03/24/2017)  . Memory loss   . Mild mitral regurgitation   . Myocardial infarction ([place]) 1997  . Panic attack   . Peripheral vascular disease, unspecified ([place])   . Pneumonia 02/2017   . Small cell lung cancer [facility]) 2014   diagnosed 2014 with radiation and chemotherapy  . Squamous cell carcinoma, leg    "right side; it was not basal" (03/24/2017)  . Systolic murmur     Past Surgical History:  Procedure Laterality Date  . APPENDECTOMY    . BRONCHIAL WASHINGS Left 2014   "then took biopsies"  . CARDIAC CATHETERIZATION  ?1997  . COLONOSCOPY    . COLONOSCOPY N/A 05/09/2016   Procedure: COLONOSCOPY;  Surgeon: Rogene Houston, MD;  Location: [facility]O [facility];  Service: Endoscopy;  Laterality: N/A;  1030  . CORONARY ARTERY BYPASS GRAFT  1997   "CABG X4"  . HEMORRHOIDECTOMY WITH HEMORRHOID BANDING  1970s  . HERNIA REPAIR    . SQUAMOUS CELL CARCINOMA EXCISION Right    leg  . TONSILLECTOMY    . UMBILICAL HERNIA REPAIR  1985     Inpatient Medications: Scheduled Meds: . allopurinol  300 mg Oral Daily  . ALPRAZolam  0.5 mg Oral QHS  . aspirin  325 mg Oral Daily  . colchicine  0.6 mg Oral BID  . furosemide  80 mg Oral Daily  . ipratropium-albuterol  3 mL Nebulization Q6H  . levothyroxine  50 mcg Oral QAC breakfast  . methylPREDNISolone (SOLU-MEDROL) injection  40 mg Intravenous Q6H  . simvastatin  40 mg Oral QHS  . sodium chloride flush  10-40 mL Intracatheter Q12H   Continuous Infusions: . sodium chloride     PRN Meds: acetaminophen **OR** acetaminophen, HYDROcodone-acetaminophen, ondansetron **OR** ondansetron (ZOFRAN) IV, sodium chloride flush, sodium chloride flush  Allergies:   No Known Allergies  Social History:   Social History   Social History  . Marital status: Married    Spouse name: N/A  . Number of children: N/A  . Years of education: N/A   Occupational History  . Not on file.   Social History Main Topics  . Smoking status: Former Smoker    Packs/day: 3.00    Years: 50.00    Types: Cigarettes    Quit date: 05/09/2012  . Smokeless tobacco: Never Used     Comment: 03/24/2017 "used e-cigarettes for a short time when trying to quit regular  cigarettes"  . Alcohol use No  . Drug use: No  . Sexual activity: Not Currently   Other Topics Concern  . Not on file   Social History Narrative  . No narrative on file    Family History:   The patient's family history includes CAD in his mother.  ROS:  Please see the history of present illness.  ROS  All other ROS reviewed and negative.     Physical Exam/Data:   Vitals:   03/24/17 2053 03/24/17 2205 03/24/17 2248 03/24/17 2254  BP: (!) 143/84 133/72  Marland Kitchen)  146/81  Pulse: 79 88  92  Resp: 18 18  18   Temp:    97.7 F (36.5 C)  TempSrc:    Oral  SpO2: 93% 95%  96%  Weight:   186 lb 11.2 oz (84.7 kg)   Height:   5\' 11"  (1.803 m)    No intake or output data in the 24 hours ending 03/25/17 0022 Filed Weights   03/24/17 1437 03/24/17 2248  Weight: 184 lb (83.5 kg) 186 lb 11.2 oz (84.7 kg)   Body mass index is 26.04 kg/m.  General:  Well nourished, well developed, in no acute distress HEENT: normal Lymph: no adenopathy Neck: no JVD Endocrine:  No thryomegaly Vascular: No carotid bruits; FA pulses 2+ bilaterally without bruits  Cardiac:  normal S1, S2; RRR; no murmur  Lungs:crackles and rhonchi at the RLL base Abd: soft, nontender, no hepatomegaly  Ext: no edema Musculoskeletal:  No deformities, BUE and BLE strength normal and equal Skin: warm and dry  Neuro:  CNs 2-12 intact, no focal abnormalities noted Psych:  Normal affect    EKG:  The EKG was personally reviewed and demonstrates NSR with nonspecific IVCD  Relevant CV Studies: none  Laboratory Data:  Chemistry Recent Labs Lab 03/24/17 1512  NA 139  K 3.7  CL 98*  CO2 31  GLUCOSE 101*  BUN 22*  CREATININE 1.28*  CALCIUM 9.7  GFRNONAA 54*  GFRAA >60  ANIONGAP 10    No results for input(s): PROT, ALBUMIN, AST, ALT, ALKPHOS, BILITOT in the last 168 hours. Hematology Recent Labs Lab 03/24/17 1512  WBC 5.0  RBC 4.21*  HGB 13.7  HCT 41.8  MCV 99.3  MCH 32.5  MCHC 32.8  RDW 16.3*  PLT 199     Cardiac Enzymes Recent Labs Lab 03/24/17 1728  TROPONINI 9.05*   No results for input(s): TROPIPOC in the last 168 hours.  BNP Recent Labs Lab 03/24/17 1512  BNP 620.0*    DDimer  Recent Labs Lab 03/24/17 1801  DDIMER 0.79*    Radiology/Studies:  Dg Chest 2 View  Result Date: 03/24/2017 CLINICAL DATA:  History of lung cancer.  Shortness of breath. EXAM: CHEST  2 VIEW COMPARISON:  01/29/2017 FINDINGS: Injectable Port-A-Cath is in stable position. Postsurgical changes from CABG, with re- demonstrated fractured sternal wires. The cardiac silhouette is normal. There is a relatively stable radiographically left suprahilar and upper lobe spiculated density. No evidence of airspace consolidation. Osseous structures are without acute abnormality. Soft tissues are grossly normal. IMPRESSION: Relatively stable radiographically left suprahilar and left upper lobe spiculated density. This may represent known post radiation changes, however focal recurrence cannot be excluded. Otherwise no evidence of focal airspace consolidation. Electronically Signed   By: Fidela Salisbury M.D.   On: 03/24/2017 15:39   Ct Head Wo Contrast  Result Date: 03/24/2017 CLINICAL DATA:  Syncopal episode EXAM: CT HEAD WITHOUT CONTRAST TECHNIQUE: Contiguous axial images were obtained from the base of the skull through the vertex without intravenous contrast. COMPARISON:  MRI 03/19/2017 FINDINGS: Brain: No acute territorial infarction or intracranial mass is seen. Slightly complex right subdural collection, measuring 9 mm maximum thickness on coronal views and probably not significantly changed compared with MRI 03/19/2017. No midline shift. Mild periventricular white matter small vessel ischemic changes. Atrophy. Stable ventricle size. Vascular: No hyperdense vessels. Carotid artery calcifications. Vertebral artery calcifications. Skull: No fracture or suspicious bone lesion. Sinuses/Orbits: No acute finding. Other: None  IMPRESSION: 1. 9 mm thick subdural collection along the  right convexity. No midline shift or significant mass effect. 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 No definite acute hemorrhage is seen at this time. 2. Mild white matter small vessel ischemic changes. Electronically Signed   By: Donavan Foil M.D.   On: 03/24/2017 19:03    Assessment and Plan:   1. Elevated troponin of 9 - he has no complaints of angina although he does have a remote history of CAD with remote CABG in 1997.  He has had some mild DOE over the past week but recently had PNA 3 weeks ago and has had a residual cough. His BNP is elevated to he could have a component of CHF resulting in trop elevation although typically not this high.  The trop can also be elevated in acute CNS events such as head bleed.  He EKG is nonischemic.  I will cycle troponin and check a 2D echo in am to assess LVF.  No IV heparin in light of subdural bleed.  2.  Syncope - concerning for arrhythmia given no warning.  An event monitor was ordered at [facility] yesterday although could consider ILR.  He does report he was working out in the sun that day so labile BP changes could be a consideration.  Will check echo for LVF and orthostatics. . 3.  Orthopnea with chest congestion and cough and recent PNA.  BNP mildly elevated so likely has a component of CHF.  Cxray does not show any edema.    4.  CAD with remote CABG at [facility] - await echocardiogram and repeat troponin. He's not having any chest pain or SOB other than orthopnea. Orthopnea could be represent a component of angina decubitus but need to await workup of the above before committing to ischemic eval (not a candidate for ASA or Heparin until cleared by neurology due to subdural hematoma).  5.  Subdural bleed secondary to head trauma from recent syncope and fall - per [facility] and  Neurosurgery    Signed, Fransico Him, MD  03/25/2017 12:22 AM

## 2017-03-25 NOTE — Consult Note (Signed)
Reason for Consult: Subdural hematoma Referring Physician: Medicine  Manuel Rivera is an 71 y.o. male.  HPI: 71 year old male with history of significant cardiac disease. Approximate 1 month ago the patient suffered a syncopal spell and then fell off a ladder. And determined length of loss of consciousness. Patient awakened with no L fax. The patient notes that over the past month he is felt some generalized fatigue. He is also recovering from a community-acquired pneumonia. The patient denies any numbness, weakness, paresthesias, seizures, or loss of coordination. He is having no speech or language difficulties. He's having no symptoms of visual difficulty. MRI scan was done 5 days ago which demonstrated a small chronic right convexity subdural hematoma with minimal mass effect. Patient referred to emergency department last night while being evaluated in cardiologist office within this result was discovered. Patient also found to have significantly elevated troponin levels and elevated BNP levels worrisome for cardiac event.  Currently patient denies any headache. He denies any numbness weakness or paresthesias. He states he feels well other than feeling like there is a rattle his chest and  Past Medical History:  Diagnosis Date  . Anginal pain ([place]) 1997  . Anxiety   . Arthritis    "hands, knees, back" (03/24/2017)  . CAD in native artery    a. s/p CABG 1997.  Marland Kitchen CHF (congestive heart failure) ([place])   . CKD (chronic kidney disease), stage III    by labs  . COPD (chronic obstructive pulmonary disease) ([place])   . Depression   . Gout   . Hyperlipidemia   . Hypertension   . Hypothyroidism   . Lipoma    "left thigh; never treated; it's a fatty tumor" (03/24/2017)  . Memory loss   . Mild mitral regurgitation   . Myocardial infarction ([place]) 1997  . Panic attack   . Peripheral vascular disease, unspecified ([place])   . Pneumonia 02/2017  . Small cell lung cancer [facility]) 2014   diagnosed 2014 with  radiation and chemotherapy  . Squamous cell carcinoma, leg    "right side; it was not basal" (03/24/2017)  . Systolic murmur     Past Surgical History:  Procedure Laterality Date  . APPENDECTOMY    . BRONCHIAL WASHINGS Left 2014   "then took biopsies"  . CARDIAC CATHETERIZATION  ?1997  . COLONOSCOPY    . COLONOSCOPY N/A 05/09/2016   Procedure: COLONOSCOPY;  Surgeon: Rogene Houston, MD;  Location: [facility] ENDO [facility];  Service: Endoscopy;  Laterality: N/A;  1030  . CORONARY ARTERY BYPASS GRAFT  1997   "CABG X4"  . HEMORRHOIDECTOMY WITH HEMORRHOID BANDING  1970s  . HERNIA REPAIR    . SQUAMOUS CELL CARCINOMA EXCISION Right    leg  . TONSILLECTOMY    . UMBILICAL HERNIA REPAIR  1985    Family History  Problem Relation Age of Onset  . CAD Mother        MI 45    Social History:  reports that he quit smoking about 4 years ago. His smoking use included Cigarettes. He has a 150.00 pack-year smoking history. He has never used smokeless tobacco. He reports that he does not drink alcohol or use drugs.  Allergies: No Known Allergies  Medications: I have reviewed the patient's current medications.  Results for orders placed or performed during the hospital encounter of 03/24/17 (from the past 48 hour(s))  Basic metabolic panel     Status: Abnormal   Collection Time: 03/24/17  3:12 PM  Result  Value Ref Range   Sodium 139 135 - 145 mmol/L   Potassium 3.7 3.5 - 5.1 mmol/L   Chloride 98 (L) 101 - 111 mmol/L   CO2 31 22 - 32 mmol/L   Glucose, Bld 101 (H) 65 - 99 mg/dL   BUN 22 (H) 6 - 20 mg/dL   Creatinine, Ser 1.28 (H) 0.61 - 1.24 mg/dL   Calcium 9.7 8.9 - 10.3 mg/dL   GFR calc non Af Amer 54 (L) >60 mL/min   GFR calc Af Amer >60 >60 mL/min    Comment: (NOTE) The eGFR has been calculated using the CKD EPI equation. This calculation has not been validated in all clinical situations. eGFR's persistently <60 mL/min signify possible Chronic Kidney Disease.    Anion gap 10 5 - 15  Brain  natriuretic peptide     Status: Abnormal   Collection Time: 03/24/17  3:12 PM  Result Value Ref Range   B Natriuretic Peptide 620.0 (H) 0.0 - 100.0 pg/mL  CBC with Differential     Status: Abnormal   Collection Time: 03/24/17  3:12 PM  Result Value Ref Range   WBC 5.0 4.0 - 10.5 K/uL   RBC 4.21 (L) 4.22 - 5.81 MIL/uL   Hemoglobin 13.7 13.0 - 17.0 g/dL   HCT 41.8 39.0 - 52.0 %   MCV 99.3 78.0 - 100.0 fL   MCH 32.5 26.0 - 34.0 pg   MCHC 32.8 30.0 - 36.0 g/dL   RDW 16.3 (H) 11.5 - 15.5 %   Platelets 199 150 - 400 K/uL   Neutrophils Relative % 69 %   Neutro Abs 3.4 1.7 - 7.7 K/uL   Lymphocytes Relative 19 %   Lymphs Abs 1.0 0.7 - 4.0 K/uL   Monocytes Relative 11 %   Monocytes Absolute 0.6 0.1 - 1.0 K/uL   Eosinophils Relative 1 %   Eosinophils Absolute 0.1 0.0 - 0.7 K/uL   Basophils Relative 0 %   Basophils Absolute 0.0 0.0 - 0.1 K/uL  Troponin I     Status: Abnormal   Collection Time: 03/24/17  5:28 PM  Result Value Ref Range   Troponin I 9.05 (HH) <0.03 ng/mL    Comment: CRITICAL RESULT CALLED TO, READ BACK BY AND VERIFIED WITH: CARDWELL,L AT 1820 ON 6.4.2018 BY ISLEY,B   D-dimer, quantitative     Status: Abnormal   Collection Time: 03/24/17  6:01 PM  Result Value Ref Range   D-Dimer, Quant 0.79 (H) 0.00 - 0.50 ug/mL-FEU    Comment: (NOTE) At the manufacturer cut-off of 0.50 ug/mL FEU, this assay has been documented to exclude PE with a sensitivity and negative predictive value of 97 to 99%.  At this time, this assay has not been approved by the FDA to exclude DVT/VTE. Results should be correlated with clinical presentation.   Troponin I     Status: Abnormal   Collection Time: 03/24/17 11:59 PM  Result Value Ref Range   Troponin I 7.01 (HH) <0.03 ng/mL    Comment: CRITICAL RESULT CALLED TO, READ BACK BY AND VERIFIED WITH: SPRADLING,S RN 03/25/2017 0126 JORDANS   Troponin I     Status: Abnormal   Collection Time: 03/25/17  5:43 AM  Result Value Ref Range   Troponin  I 6.09 (HH) <0.03 ng/mL    Comment: CRITICAL VALUE NOTED.  VALUE IS CONSISTENT WITH PREVIOUSLY REPORTED AND CALLED VALUE.  CBC     Status: Abnormal   Collection Time: 03/25/17  5:43 AM  Result Value  Ref Range   WBC 3.6 (L) 4.0 - 10.5 K/uL   RBC 3.98 (L) 4.22 - 5.81 MIL/uL   Hemoglobin 12.7 (L) 13.0 - 17.0 g/dL   HCT 38.9 (L) 39.0 - 52.0 %   MCV 97.7 78.0 - 100.0 fL   MCH 31.9 26.0 - 34.0 pg   MCHC 32.6 30.0 - 36.0 g/dL   RDW 15.9 (H) 11.5 - 15.5 %   Platelets 196 150 - 400 K/uL  Comprehensive metabolic panel     Status: Abnormal   Collection Time: 03/25/17  5:43 AM  Result Value Ref Range   Sodium 139 135 - 145 mmol/L   Potassium 4.0 3.5 - 5.1 mmol/L   Chloride 98 (L) 101 - 111 mmol/L   CO2 28 22 - 32 mmol/L   Glucose, Bld 139 (H) 65 - 99 mg/dL   BUN 24 (H) 6 - 20 mg/dL   Creatinine, Ser 1.39 (H) 0.61 - 1.24 mg/dL   Calcium 9.4 8.9 - 10.3 mg/dL   Total Protein 6.8 6.5 - 8.1 g/dL   Albumin 3.3 (L) 3.5 - 5.0 g/dL   AST 75 (H) 15 - 41 U/L   ALT 33 17 - 63 U/L   Alkaline Phosphatase 89 38 - 126 U/L   Total Bilirubin 1.0 0.3 - 1.2 mg/dL   GFR calc non Af Amer 49 (L) >60 mL/min   GFR calc Af Amer 56 (L) >60 mL/min    Comment: (NOTE) The eGFR has been calculated using the CKD EPI equation. This calculation has not been validated in all clinical situations. eGFR's persistently <60 mL/min signify possible Chronic Kidney Disease.    Anion gap 13 5 - 15    Dg Chest 2 View  Result Date: 03/24/2017 CLINICAL DATA:  History of lung cancer.  Shortness of breath. EXAM: CHEST  2 VIEW COMPARISON:  01/29/2017 FINDINGS: Injectable Port-A-Cath is in stable position. Postsurgical changes from CABG, with re- demonstrated fractured sternal wires. The cardiac silhouette is normal. There is a relatively stable radiographically left suprahilar and upper lobe spiculated density. No evidence of airspace consolidation. Osseous structures are without acute abnormality. Soft tissues are grossly normal.  IMPRESSION: Relatively stable radiographically left suprahilar and left upper lobe spiculated density. This may represent known post radiation changes, however focal recurrence cannot be excluded. Otherwise no evidence of focal airspace consolidation. Electronically Signed   By: Fidela Salisbury M.D.   On: 03/24/2017 15:39   Ct Head Wo Contrast  Result Date: 03/24/2017 CLINICAL DATA:  Syncopal episode EXAM: CT HEAD WITHOUT CONTRAST TECHNIQUE: Contiguous axial images were obtained from the base of the skull through the vertex without intravenous contrast. COMPARISON:  MRI 03/19/2017 FINDINGS: Brain: No acute territorial infarction or intracranial mass is seen. Slightly complex right subdural collection, measuring 9 mm maximum thickness on coronal views and probably not significantly changed compared with MRI 03/19/2017. No midline shift. Mild periventricular white matter small vessel ischemic changes. Atrophy. Stable ventricle size. Vascular: No hyperdense vessels. Carotid artery calcifications. Vertebral artery calcifications. Skull: No fracture or suspicious bone lesion. Sinuses/Orbits: No acute finding. Other: None IMPRESSION: 1. 9 mm thick subdural collection along the right convexity. No midline shift or significant mass effect. 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 No definite acute hemorrhage is seen at this time. 2. Mild white matter small vessel ischemic changes. Electronically Signed   By: Donavan Foil M.D.   On: 03/24/2017 19:03  Pertinent items noted in HPI and remainder of comprehensive ROS otherwise negative. Blood pressure 114/70, pulse 88, temperature 98 F (36.7 C), temperature source Oral, resp. rate 17, height 5' 11"  (1.803 m), weight 84.7 kg (186 lb 11.2 oz), SpO2 94 %. Patient is awake and alert. He is oriented and appropriate. His speech is fluent. His judgment and  insight are intact. Cranial nerve function normal bilaterally. Motor examination 5/5 with no pronator drift. Sensory examination nonfocal. Deep tendon reflexes hypoactive but symmetric. No evidence of long track signs. Gait not tested. Examination head ears eyes nose and throat is unremarkable. Chest and abdomen are benign. Extremities are free from injury deformity.  Assessment/Plan: Small chronic right convexity subdural hematoma with minimal if any mass effect. Currently no indication for surgical intervention. Patient may be said and mobilized from my standpoint. Need follow-up with me in 2 weeks with a repeat head CT scan.  Morgaine Kimball A 03/25/2017, 11:28 AM

## 2017-03-25 NOTE — Progress Notes (Signed)
Initial Nutrition Assessment  DOCUMENTATION CODES:   Not applicable  INTERVENTION:    Ensure Enlive po BID, each supplement provides 350 kcal and 20 grams of protein  NUTRITION DIAGNOSIS:   Increased nutrient needs related to catabolic illness as evidenced by estimated needs  GOAL:   Patient will meet greater than or equal to 90% of their needs  MONITOR:   PO intake, Supplement acceptance, Labs, Weight trends, Skin, I & O's  REASON FOR ASSESSMENT:   Malnutrition Screening Tool  ASSESSMENT:   71 y.o. Male with PMH of small cell lung cancer diagnosed 2014 with radiation and chemotherapy, currently not on any treatment, CAD status post remote CABG in 1997, COPD, hypothyroidism, hyperlipidemia, chronic kidney disease stage III, peripheral vascular disease who was seen at cardiology office for evaluation of syncope  Pt reports a decreased appetite.  Was eating 2 meals PTA per day.  Skips dinner.  Doesn't typically drink nutrition supplements, however, interested in trying during hospitalization. States he's lost about 5 lbs.  Time frame unknown. Labs and medications reviewed. CBG's 139-101.  Unable to complete Nutrition-Focused physical exam at this time.   Diet Order:  Diet Heart Room service appropriate? Yes; Fluid consistency: Thin Diet NPO time specified Except for: Sips with Meds  Skin:  Reviewed, no issues  Last BM:  6/3  Height:   Ht Readings from Last 1 Encounters:  03/24/17 5\' 11"  (1.803 m)    Weight:   Wt Readings from Last 1 Encounters:  03/24/17 186 lb 11.2 oz (84.7 kg)    Ideal Body Weight:  78.1 kg  BMI:  Body mass index is 26.04 kg/m.  Estimated Nutritional Needs:   Kcal:  2100-2300  Protein:  110-120 gm  Fluid:  2.1-2.3 L  EDUCATION NEEDS:   No education needs identified at this time  Arthur Holms, RD, LDN Pager #: [phone] After-Hours Pager #: [phone]

## 2017-03-26 ENCOUNTER — Inpatient Hospital Stay (HOSPITAL_COMMUNITY): Payer: PPO

## 2017-03-26 DIAGNOSIS — I249 Acute ischemic heart disease, unspecified: Secondary | ICD-10-CM

## 2017-03-26 DIAGNOSIS — I214 Non-ST elevation (NSTEMI) myocardial infarction: Secondary | ICD-10-CM

## 2017-03-26 DIAGNOSIS — I739 Peripheral vascular disease, unspecified: Secondary | ICD-10-CM

## 2017-03-26 MED ORDER — FUROSEMIDE 80 MG PO TABS: 80.0000 mg | ORAL_TABLET | Freq: Every day | ORAL | Status: DC

## 2017-03-26 MED ORDER — REGADENOSON 0.4 MG/5ML IV SOLN
0.4000 mg | Freq: Once | INTRAVENOUS | Status: AC
  Administered 2017-03-26: 0.4 mg via INTRAVENOUS
  Filled 2017-03-26: qty 5

## 2017-03-26 MED ORDER — SODIUM CHLORIDE 0.9 % WEIGHT BASED INFUSION
1.0000 mL/kg/h | INTRAVENOUS | Status: DC
  Administered 2017-03-26: 1 mL/kg/h via INTRAVENOUS

## 2017-03-26 MED ORDER — TECHNETIUM TC 99M TETROFOSMIN IV KIT
10.0000 | PACK | Freq: Once | INTRAVENOUS | Status: AC | PRN
  Administered 2017-03-26: 10 via INTRAVENOUS

## 2017-03-26 MED ORDER — ALPRAZOLAM 0.25 MG PO TABS
0.2500 mg | ORAL_TABLET | Freq: Once | ORAL | Status: AC
  Administered 2017-03-26: 0.25 mg via ORAL

## 2017-03-26 MED ORDER — ASPIRIN 81 MG PO CHEW: 81.0000 mg | CHEWABLE_TABLET | Freq: Once | ORAL | Status: AC

## 2017-03-26 MED ORDER — TECHNETIUM TC 99M TETROFOSMIN IV KIT
30.0000 | PACK | Freq: Once | INTRAVENOUS | Status: AC | PRN
  Administered 2017-03-26: 30 via INTRAVENOUS

## 2017-03-26 MED ORDER — REGADENOSON 0.4 MG/5ML IV SOLN
INTRAVENOUS | Status: AC
  Filled 2017-03-26: qty 5

## 2017-03-26 NOTE — Progress Notes (Signed)
Discussed Myoview with Dr Johnsie Cancel and Dr Oval Linsey. Study read as "high risk". I have contacted Dr Marchelle Folks office and his office RN will contact him at 7:30 tomorrow to speak with Dr Oval Linsey to see if the pt can take Plavix. He is on the cath board as a possible cath pending discussion between Dr Annette Stable and Dr Lajuana Ripple.  Kerin Ransom PA-C 03/26/2017 5:17 PM

## 2017-03-26 NOTE — Evaluation (Signed)
Occupational Therapy Evaluation and Discharge Patient Details Name: Manuel Rivera MRN: [id number] DOB: [birth year] Today's Date: 03/26/2017    History of Present Illness Patient is a 71 yo male admitted 03/24/17 with SDH.  Patient had fallen off ladder (? syncope on ladder) a few weeks prior to admission.  OP testing showed SDH  Patient also with ? ACS - elevated troponin 9.05--now trending down. PMH:  lung CA, CAD, MI, CABG, COPD, HLD, CKD, PVD, anxiety, arthritis, memory loss, CHF   Clinical Impression   This 71 yo male admitted with above presents to acute OT at an Independent level. No further OT needs, we will sign off.    Follow Up Recommendations  No OT follow up    Equipment Recommendations  None recommended by OT       Precautions / Restrictions Precautions Precautions: None Restrictions Weight Bearing Restrictions: No      Mobility Bed Mobility Overal bed mobility: Independent                Transfers Overall transfer level: Independent                        ADL either performed or assessed with clinical judgement   ADL Overall ADL's : Independent                                       General ADL Comments: Did recommend to pt that he consider sitting to get his LBD instead of standing and propping. Sitting much safer     Vision Patient Visual Report: No change from baseline              Pertinent Vitals/Pain Pain Assessment: No/denies pain     Hand Dominance Right   Extremity/Trunk Assessment Upper Extremity Assessment Upper Extremity Assessment: Overall WFL for tasks assessed           Communication Communication Communication: No difficulties   Cognition Arousal/Alertness: Awake/alert Behavior During Therapy: WFL for tasks assessed/performed Overall Cognitive Status: Within Functional Limits for tasks assessed                                                Home Living Family/patient  expects to be discharged to:: Private residence Living Arrangements: Spouse/significant other;Children (pt and wife care for disabled dtr) Available Help at Discharge: Family;Available 24 hours/day Type of Home: House Home Access: Ramped entrance     Home Layout: One level     Bathroom Shower/Tub: Occupational psychologist: Handicapped height     Home Equipment: Shower seat;Grab bars - toilet;Grab bars - tub/shower;Hand held shower head          Prior Functioning/Environment Level of Independence: Independent        Comments: Drives                 OT Goals(Current goals can be found in the care plan section) Acute Rehab OT Goals Patient Stated Goal: home soon  OT Frequency:                AM-PAC PT "6 Clicks" Daily Activity     Outcome Measure Help from another person eating meals?: None Help from another person taking care of personal grooming?:  None Help from another person toileting, which includes using toliet, bedpan, or urinal?: None Help from another person bathing (including washing, rinsing, drying)?: None Help from another person to put on and taking off regular upper body clothing?: None Help from another person to put on and taking off regular lower body clothing?: None 6 Click Score: 24   End of Session Equipment Utilized During Treatment:  (None) Nurse Communication:  (gave pt mouth swabs to wet his mouth with )  Activity Tolerance: Patient tolerated treatment well Patient left: in bed;with call bell/phone within reach;with family/visitor present  OT Visit Diagnosis: History of falling (Z91.81)                Time: 1859-0931 OT Time Calculation (min): 21 min Charges:  OT General Charges $OT Visit: 1 Procedure OT Evaluation $OT Eval Moderate Complexity: 1 Procedure Golden Circle, OTR/L [phone] 03/26/2017

## 2017-03-26 NOTE — Progress Notes (Signed)
PROGRESS NOTE    Manuel Rivera  RCV:[id number] DOB: 08/23/1946 DOA: 03/24/2017 PCP: Manuel Percy, MD   Chief Complaint  Patient presents with  . 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 While at the office, patient's primary care physician called relating information of MRI done on 03/09/2017 showing a subdural hematoma and requested patient go to emergency department. Patient presented to [facility] emergency department, then transferred to [facility] for neurosurgery and cardiology consultations. 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 -Troponin 9.05 and trending down to 5.5 -Echocardiogram EF 10-25%, grade 1 diastolic dysfunction, mild mitral regurgitation, mild aortic valve regurgitation -Cardiology consulted and assisting -Continue statin, aspirin -Pending further recommendations from cardiology currently considering taking patient for cardiac cath  Subdural hematoma -s/p fall which occurred approximately one month ago, fell off of a ladder -MRI brain done prior to admission, showed small chronic right convexity subdural hematoma with minimal mass effect -CT head: 9 mm thick subdural collection along the right convexity, no midline shift or mass effect. -Neurosurgery consult and appreciated, feels patient has no indications for surgical intervention at this time. Recommended outpatient follow-up with Dr. Annette Rivera in 2 weeks with repeat CT head.  Acute bronchitis -Continue Solu-Medrol, Mucinex, nebulizer treatments -Patient recently diagnosed and treated for pneumonia 3 weeks ago  Syncope -Cardiology consult and appreciated, patient was supposed to have an event monitor  approximately one month ago -Echocardiogram as above - PT and OT  Acute diastolic CHF -Echocardiogram as above -Continue Lasix -Monitor intake and output, daily weights  Orthopnea -Possibly related to acute bronchitis/chest congestion and recent pneumonia versus CHF component  Hypothyroidism -Continue Synthroid  DVT Prophylaxis  SCDs  Code Status: Full  Family Communication: Wife at bedside  Disposition Plan: pending further work up recommendations by Cardiology  Consultants Cardiology Neurosurgery  Procedures  Echocardiogram  Antibiotics   Anti-infectives    None      Subjective:   Manuel Rivera Pt denies any current Chest pain.  Objective:   Vitals:   03/26/17 1232 03/26/17 1234 03/26/17 1237 03/26/17 1529  BP: 122/82 112/69 129/68 133/83  Pulse:    75  Resp:    20  Temp:    97.6 F (36.4 C)  TempSrc:    Oral  SpO2:    94%  Weight:      Height:        Intake/Output Summary (Last 24 hours) at 03/26/17 1759 Last data filed at 03/25/17 2100  Gross per 24 hour  Intake              360 ml  Output                0 ml  Net              360 ml   Filed Weights   03/24/17 1437 03/24/17 2248  Weight: 83.5 kg (184 lb) 84.7 kg (186 lb 11.2 oz)    Exam  General: Well developed, well nourished, NAD  HEENT: NCAT, mucous membranes moist.   Cardiovascular: S1 S2 auscultated, no rubs, murmurs or gallops. Regular rate and rhythm.  Respiratory: Equal chest rise, no wheezes  Abdomen: Soft, nontender, nondistended, + bowel sounds  Extremities:  warm dry without cyanosis clubbing or edema  Neuro: AAOx3, nonfocal  Psych: Normal affect and demeanor with intact judgement and insight   Data Reviewed: I have personally reviewed following labs and imaging studies  CBC:  Recent Labs Lab 03/24/17 1512 03/25/17 0543  WBC 5.0 3.6*  NEUTROABS 3.4  --   HGB 13.7 12.7*  HCT 41.8 38.9*  MCV 99.3 97.7  PLT 199 734   Basic Metabolic Panel:  Recent  Labs Lab 03/24/17 1512 03/25/17 0543  NA 139 139  K 3.7 4.0  CL 98* 98*  CO2 31 28  GLUCOSE 101* 139*  BUN 22* 24*  CREATININE 1.28* 1.39*  CALCIUM 9.7 9.4   GFR: Estimated Creatinine Clearance: 50.4 mL/min (A) (by C-G formula based on SCr of 1.39 mg/dL (H)). Liver Function Tests:  Recent Labs Lab 03/25/17 0543  AST 75*  ALT 33  ALKPHOS 89  BILITOT 1.0  PROT 6.8  ALBUMIN 3.3*   No results for input(s): LIPASE, AMYLASE in the last 168 hours. No results for input(s): AMMONIA in the last 168 hours. Coagulation Profile: No results for input(s): INR, PROTIME in the last 168 hours. Cardiac Enzymes:  Recent Labs Lab 03/24/17 1728 03/24/17 2359 03/25/17 0543 03/25/17 1314  TROPONINI 9.05* 7.01* 6.09* 5.50*   BNP (last 3 results) No results for input(s): PROBNP in the last 8760 hours. HbA1C: No results for input(s): HGBA1C in the last 72 hours. CBG: No results for input(s): GLUCAP in the last 168 hours. Lipid Profile: No results for input(s): CHOL, HDL, LDLCALC, TRIG, CHOLHDL, LDLDIRECT in the last 72 hours. Thyroid Function Tests: No results for input(s): TSH, T4TOTAL, FREET4, T3FREE, THYROIDAB in the last 72 hours. Anemia Panel: No results for input(s): VITAMINB12, FOLATE, FERRITIN, TIBC, IRON, RETICCTPCT in the last 72 hours. Urine analysis: No results found for: COLORURINE, APPEARANCEUR, LABSPEC, PHURINE, GLUCOSEU, HGBUR, BILIRUBINUR, KETONESUR, PROTEINUR, UROBILINOGEN, NITRITE, LEUKOCYTESUR Sepsis Labs: @LABRCNTIP (procalcitonin:4,lacticidven:4)  )No results found for this or any previous visit (from the past 240 hour(s)).    Radiology Studies: Ct Head Wo Contrast  Result Date: 03/24/2017 CLINICAL DATA:  Syncopal episode EXAM: CT HEAD WITHOUT CONTRAST TECHNIQUE: Contiguous axial images were obtained from the base of the skull through the vertex without intravenous contrast. COMPARISON:  MRI 03/19/2017 FINDINGS: Brain: No acute territorial infarction or  intracranial mass is seen. Slightly complex right subdural collection, measuring 9 mm maximum thickness on coronal views and probably not significantly changed compared with MRI 03/19/2017. No midline shift. Mild periventricular white matter small vessel ischemic changes. Atrophy. Rivera ventricle size. Vascular: No hyperdense vessels. Carotid artery calcifications. Vertebral artery calcifications. Skull: No fracture or suspicious bone lesion. Sinuses/Orbits: No acute finding. Other: None IMPRESSION: 1. 9 mm thick subdural collection along the right convexity. No midline shift or significant mass effect. 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 No definite acute hemorrhage is seen at this time. 2. Mild white matter small vessel ischemic changes. Electronically Signed   By: Donavan Foil M.D.   On: 03/24/2017 19:03   Nm Myocar Multi W/spect W/wall Motion / Ef  Result Date: 03/26/2017  Findings consistent with ischemia and prior myocardial infarction.  This is a high risk study.  The left ventricular ejection fraction is moderately decreased (30-44%).  There was no ST segment deviation noted during stress.  Large inferior lateral scar with significant anterolateral wall ischemia EF 44% with LVE     Scheduled  Meds: . allopurinol  300 mg Oral Daily  . ALPRAZolam  0.5 mg Oral QHS  . aspirin  325 mg Oral Daily  . colchicine  0.6 mg Oral BID  . feeding supplement (ENSURE ENLIVE)  237 mL Oral BID BM  . [START ON 03/28/2017] furosemide  80 mg Oral Daily  . levothyroxine  50 mcg Oral QAC breakfast  . methylPREDNISolone (SOLU-MEDROL) injection  40 mg Intravenous Q6H  . simvastatin  40 mg Oral QHS  . sodium chloride flush  10-40 mL Intracatheter Q12H   Continuous Infusions: . sodium chloride       LOS: 2 days   Time Spent in minutes   30 minutes  Arnetta Odeh D.O. on 03/26/2017 at 5:59  PM  Between 7am to 7pm - Pager - [phone]  After 7pm go to [email or web address] - password [email or web address]  And look for the night coverage person covering for me after hours  [facility] Office  [phone]

## 2017-03-26 NOTE — Progress Notes (Signed)
Progress Note  Patient Name: Manuel Rivera Date of Encounter: 03/26/2017  Primary Cardiologist: Dr. Bronson Ing  Subjective   Feeling well.  Denies chest pain or shortness of breath.  No palpitations.   Inpatient Medications    Scheduled Meds: . regadenoson      . allopurinol  300 mg Oral Daily  . ALPRAZolam  0.5 mg Oral QHS  . aspirin  325 mg Oral Daily  . colchicine  0.6 mg Oral BID  . feeding supplement (ENSURE ENLIVE)  237 mL Oral BID BM  . furosemide  80 mg Oral Daily  . levothyroxine  50 mcg Oral QAC breakfast  . methylPREDNISolone (SOLU-MEDROL) injection  40 mg Intravenous Q6H  . simvastatin  40 mg Oral QHS  . sodium chloride flush  10-40 mL Intracatheter Q12H   Continuous Infusions: . sodium chloride     PRN Meds: acetaminophen **OR** acetaminophen, HYDROcodone-acetaminophen, ipratropium-albuterol, ondansetron **OR** ondansetron (ZOFRAN) IV, sodium chloride flush, sodium chloride flush   Vital Signs    Vitals:   03/26/17 1219 03/26/17 1232 03/26/17 1234 03/26/17 1237  BP: (!) 144/80 122/82 112/69 129/68  Pulse:      Resp:      Temp:      TempSrc:      SpO2:      Weight:      Height:        Intake/Output Summary (Last 24 hours) at 03/26/17 1240 Last data filed at 03/25/17 2100  Gross per 24 hour  Intake              380 ml  Output                0 ml  Net              380 ml   Filed Weights   03/24/17 1437 03/24/17 2248  Weight: 83.5 kg (184 lb) 84.7 kg (186 lb 11.2 oz)    Telemetry    Sinus rhythm.  Rare PVCs.  No events.  - Personally Reviewed  ECG    03/24/17: Sinus rhythm.  Rate 75 bpm.  Non-specific IVCD.  - Personally Reviewed  Physical Exam   GEN: Well-appearing.  No acute distress.   Neck: No JVD Cardiac: RRR, no murmurs, rubs, or gallops.  Respiratory: Clear to auscultation bilaterally.  No crackles, wheezes or rhonchi. GI: Soft, nontender, non-distended  MS: No edema; No deformity. Neuro:  Nonfocal  Psych: Normal affect    Labs    Chemistry Recent Labs Lab 03/24/17 1512 03/25/17 0543  NA 139 139  K 3.7 4.0  CL 98* 98*  CO2 31 28  GLUCOSE 101* 139*  BUN 22* 24*  CREATININE 1.28* 1.39*  CALCIUM 9.7 9.4  PROT  --  6.8  ALBUMIN  --  3.3*  AST  --  75*  ALT  --  33  ALKPHOS  --  89  BILITOT  --  1.0  GFRNONAA 54* 49*  GFRAA >60 56*  ANIONGAP 10 13     Hematology Recent Labs Lab 03/24/17 1512 03/25/17 0543  WBC 5.0 3.6*  RBC 4.21* 3.98*  HGB 13.7 12.7*  HCT 41.8 38.9*  MCV 99.3 97.7  MCH 32.5 31.9  MCHC 32.8 32.6  RDW 16.3* 15.9*  PLT 199 196    Cardiac Enzymes Recent Labs Lab 03/24/17 1728 03/24/17 2359 03/25/17 0543 03/25/17 1314  TROPONINI 9.05* 7.01* 6.09* 5.50*   No results for input(s): TROPIPOC in the last 168 hours.  BNP Recent Labs Lab 03/24/17 1512  BNP 620.0*     DDimer  Recent Labs Lab 03/24/17 1801  DDIMER 0.79*     Radiology    Dg Chest 2 View  Result Date: 03/24/2017 CLINICAL DATA:  History of lung cancer.  Shortness of breath. EXAM: CHEST  2 VIEW COMPARISON:  01/29/2017 FINDINGS: Injectable Port-A-Cath is in stable position. Postsurgical changes from CABG, with re- demonstrated fractured sternal wires. The cardiac silhouette is normal. There is a relatively stable radiographically left suprahilar and upper lobe spiculated density. No evidence of airspace consolidation. Osseous structures are without acute abnormality. Soft tissues are grossly normal. IMPRESSION: Relatively stable radiographically left suprahilar and left upper lobe spiculated density. This may represent known post radiation changes, however focal recurrence cannot be excluded. Otherwise no evidence of focal airspace consolidation. Electronically Signed   By: Fidela Salisbury M.D.   On: 03/24/2017 15:39   Ct Head Wo Contrast  Result Date: 03/24/2017 CLINICAL DATA:  Syncopal episode EXAM: CT HEAD WITHOUT CONTRAST TECHNIQUE: Contiguous axial images were obtained from the base of the  skull through the vertex without intravenous contrast. COMPARISON:  MRI 03/19/2017 FINDINGS: Brain: No acute territorial infarction or intracranial mass is seen. Slightly complex right subdural collection, measuring 9 mm maximum thickness on coronal views and probably not significantly changed compared with MRI 03/19/2017. No midline shift. Mild periventricular white matter small vessel ischemic changes. Atrophy. Stable ventricle size. Vascular: No hyperdense vessels. Carotid artery calcifications. Vertebral artery calcifications. Skull: No fracture or suspicious bone lesion. Sinuses/Orbits: No acute finding. Other: None IMPRESSION: 1. 9 mm thick subdural collection along the right convexity. No midline shift or significant mass effect. 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 No definite acute hemorrhage is seen at this time. 2. Mild white matter small vessel ischemic changes. Electronically Signed   By: Donavan Foil M.D.   On: 03/24/2017 19:03    Cardiac Studies   Echo 03/25/17: Study Conclusions  - Left ventricle: The cavity size was normal. There was mild   concentric hypertrophy. Systolic function was normal. The   estimated ejection fraction was in the range of 55% to 60%. Wall   motion was normal; there were no regional wall motion   abnormalities. Doppler parameters are consistent with abnormal   left ventricular relaxation (grade 1 diastolic dysfunction). - Aortic valve: Trileaflet; mildly thickened, moderately calcified   leaflets. There was mild regurgitation. - Mitral valve: Moderately calcified annulus. There was mild   regurgitation.  Patient Profile     Manuel Rivera is a 64M with small cell lung cancer s/p XRT and chemotherapy, CAD s/p CABG, hyperlipidemia, CKD and PAD here with subdural hematoma.  Cardiology was consulted for syncope and elevated troponin.  Assessment &  Plan    # Elevated troponin:  # CAD s/p CABG: # Syncope: Troponin was elevated to 9 on admission.  He denies chest pain.  He had a syncopal episode three weeks ago that occurred without prodrome.  He denies palpitations or recurrent events.  Telemetry is unremarkable so far.  Labs OK.  Lexiscan Myoview is pending.  Given his subdural hematoma and lack of chest pain, we will avoid cardiac cath anytime soon unless there is a significant area of ischemia.  Continue aspirin and simvastatin.  We will plan for a 30 day event monitor at discharge.   Signed, Skeet Latch, MD  03/26/2017, 12:40 PM

## 2017-03-27 ENCOUNTER — Encounter (HOSPITAL_COMMUNITY)
Admission: EM | Disposition: A | Discharge status: Home / Self Care | Payer: Self-pay | Source: Home / Self Care | Attending: Family Medicine

## 2017-03-27 ENCOUNTER — Other Ambulatory Visit: Payer: Self-pay

## 2017-03-27 ENCOUNTER — Encounter (HOSPITAL_COMMUNITY): Payer: Self-pay | Admitting: Cardiology

## 2017-03-27 ENCOUNTER — Inpatient Hospital Stay (HOSPITAL_COMMUNITY): Payer: PPO

## 2017-03-27 DIAGNOSIS — I25700 Atherosclerosis of coronary artery bypass graft(s), unspecified, with unstable angina pectoris: Secondary | ICD-10-CM

## 2017-03-27 DIAGNOSIS — I25810 Atherosclerosis of coronary artery bypass graft(s) without angina pectoris: Secondary | ICD-10-CM

## 2017-03-27 DIAGNOSIS — R9439 Abnormal result of other cardiovascular function study: Secondary | ICD-10-CM

## 2017-03-27 DIAGNOSIS — I214 Non-ST elevation (NSTEMI) myocardial infarction: Secondary | ICD-10-CM

## 2017-03-27 DIAGNOSIS — I509 Heart failure, unspecified: Secondary | ICD-10-CM

## 2017-03-27 DIAGNOSIS — I6200 Nontraumatic subdural hemorrhage, unspecified: Secondary | ICD-10-CM

## 2017-03-27 DIAGNOSIS — I6203 Nontraumatic chronic subdural hemorrhage: Secondary | ICD-10-CM

## 2017-03-27 DIAGNOSIS — C3490 Malignant neoplasm of unspecified part of unspecified bronchus or lung: Secondary | ICD-10-CM

## 2017-03-27 HISTORY — PX: LEFT HEART CATH AND CORS/GRAFTS ANGIOGRAPHY: CATH118250

## 2017-03-27 HISTORY — PX: CORONARY STENT INTERVENTION: CATH118234

## 2017-03-27 LAB — CBC
HEMATOCRIT: 38.5 % — AB (ref 39.0–52.0)
Hemoglobin: 13.1 g/dL (ref 13.0–17.0)
MCH: 33.1 pg (ref 26.0–34.0)
MCHC: 34 g/dL (ref 30.0–36.0)
MCV: 97.2 fL (ref 78.0–100.0)
PLATELETS: 238 10*3/uL (ref 150–400)
RBC: 3.96 MIL/uL — ABNORMAL LOW (ref 4.22–5.81)
RDW: 15.9 % — AB (ref 11.5–15.5)
WBC: 8.6 10*3/uL (ref 4.0–10.5)

## 2017-03-27 LAB — BASIC METABOLIC PANEL
Anion gap: 11 (ref 5–15)
BUN: 42 mg/dL — AB (ref 6–20)
CALCIUM: 8.9 mg/dL (ref 8.9–10.3)
CO2: 28 mmol/L (ref 22–32)
Chloride: 100 mmol/L — ABNORMAL LOW (ref 101–111)
Creatinine, Ser: 1.44 mg/dL — ABNORMAL HIGH (ref 0.61–1.24)
GFR calc Af Amer: 54 mL/min — ABNORMAL LOW (ref >60)
GFR, EST NON AFRICAN AMERICAN: 47 mL/min — AB (ref >60)
GLUCOSE: 141 mg/dL — AB (ref 65–99)
Potassium: 3.5 mmol/L (ref 3.5–5.1)
Sodium: 139 mmol/L (ref 135–145)

## 2017-03-27 LAB — PROTIME-INR
INR: 1.21
PROTHROMBIN TIME: 15.4 s — AB (ref 11.4–15.2)

## 2017-03-27 LAB — POCT ACTIVATED CLOTTING TIME: ACTIVATED CLOTTING TIME: 373 s

## 2017-03-27 SURGERY — LEFT HEART CATH AND CORS/GRAFTS ANGIOGRAPHY
Anesthesia: LOCAL

## 2017-03-27 MED ORDER — SODIUM CHLORIDE 0.9 % IV SOLN: 250.0000 mL | INTRAVENOUS | Status: DC | PRN

## 2017-03-27 MED ORDER — IOPAMIDOL (ISOVUE-370) INJECTION 76%
INTRAVENOUS | Status: DC | PRN
  Administered 2017-03-27: 215 mL via INTRA_ARTERIAL

## 2017-03-27 MED ORDER — IOPAMIDOL (ISOVUE-370) INJECTION 76%
INTRAVENOUS | Status: AC
  Filled 2017-03-27: qty 50

## 2017-03-27 MED ORDER — SODIUM CHLORIDE 0.9% FLUSH: 3.0000 mL | INTRAVENOUS | Status: DC | PRN

## 2017-03-27 MED ORDER — FENTANYL CITRATE (PF) 100 MCG/2ML IJ SOLN
INTRAMUSCULAR | Status: DC | PRN
  Administered 2017-03-27 (×2): 25 ug via INTRAVENOUS

## 2017-03-27 MED ORDER — LABETALOL HCL 5 MG/ML IV SOLN: 10.0000 mg | INTRAVENOUS | Status: AC | PRN

## 2017-03-27 MED ORDER — HEPARIN (PORCINE) IN NACL 2-0.9 UNIT/ML-% IJ SOLN
INTRAMUSCULAR | Status: AC
Start: 2017-03-27 — End: 2017-03-27
  Filled 2017-03-27: qty 1000

## 2017-03-27 MED ORDER — SODIUM CHLORIDE 0.9 % WEIGHT BASED INFUSION: 1.0000 mL/kg/h | INTRAVENOUS | Status: DC

## 2017-03-27 MED ORDER — SODIUM CHLORIDE 0.9% FLUSH
3.0000 mL | Freq: Two times a day (BID) | INTRAVENOUS | Status: DC
  Administered 2017-03-27 – 2017-03-28 (×2): 3 mL via INTRAVENOUS

## 2017-03-27 MED ORDER — SODIUM CHLORIDE 0.9 % WEIGHT BASED INFUSION: 3.0000 mL/kg/h | INTRAVENOUS | Status: DC

## 2017-03-27 MED ORDER — HYDRALAZINE HCL 20 MG/ML IJ SOLN: 5.0000 mg | INTRAMUSCULAR | Status: AC | PRN

## 2017-03-27 MED ORDER — FAMOTIDINE IN NACL 20-0.9 MG/50ML-% IV SOLN
INTRAVENOUS | Status: AC | PRN
  Administered 2017-03-27: 20 mg via INTRAVENOUS

## 2017-03-27 MED ORDER — FENTANYL CITRATE (PF) 100 MCG/2ML IJ SOLN
INTRAMUSCULAR | Status: AC
  Filled 2017-03-27: qty 2

## 2017-03-27 MED ORDER — BIVALIRUDIN TRIFLUOROACETATE 250 MG IV SOLR
INTRAVENOUS | Status: AC
  Filled 2017-03-27: qty 250

## 2017-03-27 MED ORDER — SODIUM CHLORIDE 0.9% FLUSH: 3.0000 mL | Freq: Two times a day (BID) | INTRAVENOUS | Status: DC

## 2017-03-27 MED ORDER — CLOPIDOGREL BISULFATE 75 MG PO TABS
75.0000 mg | ORAL_TABLET | Freq: Every day | ORAL | Status: DC
  Administered 2017-03-28: 75 mg via ORAL
  Filled 2017-03-27: qty 1

## 2017-03-27 MED ORDER — LIDOCAINE HCL (PF) 1 % IJ SOLN
INTRAMUSCULAR | Status: AC
  Filled 2017-03-27: qty 30

## 2017-03-27 MED ORDER — ASPIRIN 81 MG PO CHEW: 81.0000 mg | CHEWABLE_TABLET | ORAL | Status: DC

## 2017-03-27 MED ORDER — FAMOTIDINE IN NACL 20-0.9 MG/50ML-% IV SOLN
INTRAVENOUS | Status: AC
  Filled 2017-03-27: qty 50

## 2017-03-27 MED ORDER — ASPIRIN EC 81 MG PO TBEC
81.0000 mg | DELAYED_RELEASE_TABLET | Freq: Every day | ORAL | Status: DC
Start: 2017-03-27 — End: 2017-03-28

## 2017-03-27 MED ORDER — IOPAMIDOL (ISOVUE-370) INJECTION 76%
INTRAVENOUS | Status: AC
  Filled 2017-03-27: qty 100

## 2017-03-27 MED ORDER — SODIUM CHLORIDE 0.9 % IV SOLN
INTRAVENOUS | Status: DC | PRN
  Administered 2017-03-27: 1.75 mg/kg/h via INTRAVENOUS

## 2017-03-27 MED ORDER — CLOPIDOGREL BISULFATE 300 MG PO TABS
ORAL_TABLET | ORAL | Status: AC
  Filled 2017-03-27: qty 2

## 2017-03-27 MED ORDER — MIDAZOLAM HCL 2 MG/2ML IJ SOLN
INTRAMUSCULAR | Status: AC
  Filled 2017-03-27: qty 2

## 2017-03-27 MED ORDER — BIVALIRUDIN BOLUS VIA INFUSION - CUPID
INTRAVENOUS | Status: DC | PRN
  Administered 2017-03-27: 63.525 mg via INTRAVENOUS

## 2017-03-27 MED ORDER — IOPAMIDOL (ISOVUE-370) INJECTION 76%
INTRAVENOUS | Status: AC
  Filled 2017-03-27: qty 125

## 2017-03-27 MED ORDER — MIDAZOLAM HCL 2 MG/2ML IJ SOLN
INTRAMUSCULAR | Status: DC | PRN
  Administered 2017-03-27 (×2): 1 mg via INTRAVENOUS

## 2017-03-27 MED ORDER — SODIUM CHLORIDE 0.9 % IV SOLN: INTRAVENOUS | Status: AC

## 2017-03-27 MED ORDER — CLOPIDOGREL BISULFATE 300 MG PO TABS
ORAL_TABLET | ORAL | Status: DC | PRN
  Administered 2017-03-27: 600 mg via ORAL

## 2017-03-27 MED ORDER — IPRATROPIUM-ALBUTEROL 0.5-2.5 (3) MG/3ML IN SOLN
3.0000 mL | Freq: Four times a day (QID) | RESPIRATORY_TRACT | Status: DC
  Administered 2017-03-27 – 2017-03-28 (×4): 3 mL via RESPIRATORY_TRACT
  Filled 2017-03-27 (×3): qty 3

## 2017-03-27 MED ORDER — HEPARIN (PORCINE) IN NACL 2-0.9 UNIT/ML-% IJ SOLN
INTRAMUSCULAR | Status: AC | PRN
  Administered 2017-03-27: 1500 mL via INTRA_ARTERIAL

## 2017-03-27 SURGICAL SUPPLY — 24 items
BALLN ~~LOC~~ EUPHORA RX 4.5X15 (BALLOONS) ×2
BALLOON ~~LOC~~ EUPHORA RX 4.5X15 (BALLOONS) ×1 IMPLANT
CATH INFINITI 5 FR AR2 MOD (CATHETERS) ×2 IMPLANT
CATH INFINITI 5 FR IM (CATHETERS) ×2 IMPLANT
CATH INFINITI 5 FR LCB (CATHETERS) ×2 IMPLANT
CATH INFINITI 5 FR MPA2 (CATHETERS) ×2 IMPLANT
CATH INFINITI 5FR AL1 (CATHETERS) ×2 IMPLANT
CATH INFINITI 5FR MULTPACK ANG (CATHETERS) ×2 IMPLANT
CATH LAUNCHER 6FR AL.75 (CATHETERS) ×2 IMPLANT
COVER PRB 48X5XTLSCP FOLD TPE (BAG) ×1 IMPLANT
COVER PROBE 5X48 (BAG) ×1
DEVICE SPIDERFX EMB PROT 5MM (WIRE) ×2 IMPLANT
GUIDEWIRE 3MM J TIP .035 145 (WIRE) ×2 IMPLANT
KIT ENCORE 26 ADVANTAGE (KITS) ×2 IMPLANT
KIT HEART LEFT (KITS) ×2 IMPLANT
PACK CARDIAC CATHETERIZATION (CUSTOM PROCEDURE TRAY) ×2 IMPLANT
SHEATH PINNACLE 5F 10CM (SHEATH) ×2 IMPLANT
SHEATH PINNACLE 6F 10CM (SHEATH) ×2 IMPLANT
STENT VISION RX 4.0X18 (Permanent Stent) ×2 IMPLANT
SYR MEDRAD MARK V 150ML (SYRINGE) ×2 IMPLANT
TRANSDUCER W/STOPCOCK (MISCELLANEOUS) ×2 IMPLANT
TUBING CIL FLEX 10 FLL-RA (TUBING) ×2 IMPLANT
WIRE HI TORQ BMW 190CM (WIRE) ×2 IMPLANT
WIRE HI TORQ VERSACORE-J 145CM (WIRE) ×2 IMPLANT

## 2017-03-27 NOTE — Progress Notes (Signed)
Site area: right groin  Site Prior to Removal:  Level 0  Pressure Applied For 20 MINUTES    Minutes Beginning at 1400  Manual:   Yes.    Patient Status During Pull:  AAO X 4  Post Pull Groin Site:  Level 1 ,small oozing, small lateral linear skin tear, no hematoma   Post Pull Instructions Given:  Yes.    Post Pull Pulses Present:  Yes.    Dressing Applied:  Yes.    Comments:  TOLERATED PROCEDURE WELL

## 2017-03-27 NOTE — Progress Notes (Signed)
PROGRESS NOTE    Manuel Rivera  TDV:[id number] DOB: May 30, 1946 DOA: 03/24/2017 PCP: Rory Percy, MD   Chief Complaint  Patient presents with  . 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 While at the office, patient's primary care physician called relating information of MRI done on 03/09/2017 showing a subdural hematoma and requested patient go to emergency department. Patient presented to [facility] emergency department, then transferred to [facility] for neurosurgery and cardiology consultations. 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 -Troponin 9.05 and trending down to 5.5 - Echocardiogram EF 06-26%, grade 1 diastolic dysfunction, mild mitral regurgitation, mild aortic valve regurgitation - Cardiology consulted and assisting -Continue statin, aspirin -Pt is s/p cardiac Cath, pt developed some visual disturbances which are though to be secondary to contrast used in cath lab. Discussed with neurology who recommends consulting them next am if symptoms still present. CT scan of head ordered.  Subdural hematoma -s/p fall which occurred approximately one month ago, fell off of a ladder -MRI brain done prior to admission, showed small chronic right convexity subdural hematoma with minimal mass effect -CT head: 9 mm thick subdural collection along the right convexity, no midline shift or mass effect. -Neurosurgery consult and appreciated, feels patient has no indications for surgical intervention at this time. Recommended outpatient follow-up with Dr. Annette Stable in 2 weeks with repeat CT head. - repeat CT scan of head ordered.  Acute bronchitis -Continue Solu-Medrol, Mucinex,  nebulizer treatments -Patient recently diagnosed and treated for pneumonia 3 weeks ago  Syncope -Cardiology consult and appreciated, patient was supposed to have an event monitor approximately one month ago -Echocardiogram as above - PT and OT  Acute diastolic CHF -Echocardiogram as above -Continue Lasix -Monitor intake and output, daily weights  Orthopnea -Possibly related to acute bronchitis/chest congestion and recent pneumonia versus CHF component  Hypothyroidism -Continue Synthroid  DVT Prophylaxis  SCDs  Code Status: Full  Family Communication: Wife at bedside  Disposition Plan: pending further work up recommendations by Cardiology  Consultants Cardiology Neurosurgery  Procedures  Echocardiogram  Antibiotics   Anti-infectives    None      Subjective:   Manuel Rivera Pt states his visual abnormalities improving. No new complaints otherwise.  Objective:   Vitals:   03/27/17 0519 03/27/17 0916 03/27/17 0926 03/27/17 1235  BP: (!) 108/42     Pulse: (!) 56     Resp: 18     Temp: 97.8 F (36.6 C)     TempSrc: Oral     SpO2: 98% 96% 97% 93%  Weight:      Height:        Intake/Output Summary (Last 24 hours) at 03/27/17 1531 Last data filed at 03/27/17 0300  Gross per 24 hour  Intake           474.32 ml  Output                0 ml  Net           474.32 ml   Filed Weights   03/24/17 1437 03/24/17 2248  Weight: 83.5 kg (184 lb) 84.7 kg (186 lb 11.2 oz)    Exam  General: Well developed, well nourished, NAD  HEENT: NCAT, mucous membranes moist.   Cardiovascular: S1 S2 auscultated, no rubs, murmurs or gallops. Regular rate and rhythm.  Respiratory: Equal chest rise, no wheezes  Abdomen: Soft, nontender, nondistended, + bowel sounds  Extremities: warm dry without cyanosis clubbing or edema  Neuro: AAOx3, answers questions appropriately. Moves extremities equally  Psych: Normal affect and demeanor with intact judgement and  insight   Data Reviewed: I have personally reviewed following labs and imaging studies  CBC:  Recent Labs Lab 03/24/17 1512 03/25/17 0543 03/27/17 0827  WBC 5.0 3.6* 8.6  NEUTROABS 3.4  --   --   HGB 13.7 12.7* 13.1  HCT 41.8 38.9* 38.5*  MCV 99.3 97.7 97.2  PLT 199 196 308   Basic Metabolic Panel:  Recent Labs Lab 03/24/17 1512 03/25/17 0543 03/27/17 0827  NA 139 139 139  K 3.7 4.0 3.5  CL 98* 98* 100*  CO2 31 28 28   GLUCOSE 101* 139* 141*  BUN 22* 24* 42*  CREATININE 1.28* 1.39* 1.44*  CALCIUM 9.7 9.4 8.9   GFR: Estimated Creatinine Clearance: 48.7 mL/min (A) (by C-G formula based on SCr of 1.44 mg/dL (H)). Liver Function Tests:  Recent Labs Lab 03/25/17 0543  AST 75*  ALT 33  ALKPHOS 89  BILITOT 1.0  PROT 6.8  ALBUMIN 3.3*   No results for input(s): LIPASE, AMYLASE in the last 168 hours. No results for input(s): AMMONIA in the last 168 hours. Coagulation Profile:  Recent Labs Lab 03/27/17 0450  INR 1.21   Cardiac Enzymes:  Recent Labs Lab 03/24/17 1728 03/24/17 2359 03/25/17 0543 03/25/17 1314  TROPONINI 9.05* 7.01* 6.09* 5.50*   BNP (last 3 results) No results for input(s): PROBNP in the last 8760 hours. HbA1C: No results for input(s): HGBA1C in the last 72 hours. CBG: No results for input(s): GLUCAP in the last 168 hours. Lipid Profile: No results for input(s): CHOL, HDL, LDLCALC, TRIG, CHOLHDL, LDLDIRECT in the last 72 hours. Thyroid Function Tests: No results for input(s): TSH, T4TOTAL, FREET4, T3FREE, THYROIDAB in the last 72 hours. Anemia Panel: No results for input(s): VITAMINB12, FOLATE, FERRITIN, TIBC, IRON, RETICCTPCT in the last 72 hours. Urine analysis: No results found for: COLORURINE, APPEARANCEUR, LABSPEC, PHURINE, GLUCOSEU, HGBUR, BILIRUBINUR, KETONESUR, PROTEINUR, UROBILINOGEN, NITRITE, LEUKOCYTESUR Sepsis Labs: @LABRCNTIP (procalcitonin:4,lacticidven:4)  )No results found for this or any previous visit (from the  past 240 hour(s)).    Radiology Studies: Nm Myocar Multi W/spect W/wall Motion / Ef  Result Date: 03/26/2017  Findings consistent with ischemia and prior myocardial infarction.  This is a high risk study.  The left ventricular ejection fraction is moderately decreased (30-44%).  There was no ST segment deviation noted during stress.  Large inferior lateral scar with significant anterolateral wall ischemia EF 44% with LVE     Scheduled Meds: . allopurinol  300 mg Oral Daily  . ALPRAZolam  0.5 mg Oral QHS  . [START ON 03/28/2017] aspirin  81 mg Oral Pre-Cath  . aspirin EC  81 mg Oral Daily  . [START ON 03/28/2017] clopidogrel  75 mg Oral Q breakfast  . colchicine  0.6 mg Oral BID  . feeding supplement (ENSURE ENLIVE)  237 mL Oral BID BM  . levothyroxine  50 mcg Oral QAC breakfast  . methylPREDNISolone (SOLU-MEDROL) injection  40 mg Intravenous Q6H  . simvastatin  40 mg Oral QHS  . sodium chloride flush  10-40 mL Intracatheter Q12H  . sodium chloride flush  3 mL Intravenous Q12H  . sodium chloride flush  3 mL Intravenous Q12H   Continuous Infusions: . sodium chloride    . sodium chloride    . sodium chloride    . sodium chloride    . [START ON 03/28/2017] sodium chloride     Followed by  . [START ON 03/28/2017] sodium chloride       LOS: 3 days   Time Spent in minutes   30 minutes  Clide Remmers D.O. on 03/27/2017 at 3:31 PM  Between 7am to 7pm - Pager - [phone]  After 7pm go to [email or web address] - password [email or web address]  And look for the night coverage person covering for me after hours  [facility] Office  [phone]

## 2017-03-27 NOTE — Care Management Note (Signed)
Case Management Note  Patient Details  Name: GAUDENCIO CHESNUT MRN: [id number] Date of Birth: 1946/05/19  Subjective/Objective:   From home, s/p coronary stent intervention ,will be on plavx , per pt/ot eval no pt/ot follow up needed.    PCP Rory Percy               Action/Plan: NCM will follow for dc needs.  Expected Discharge Date:                  Expected Discharge Plan:     In-House Referral:     Discharge planning Services  CM Consult  Post Acute Care Choice:    Choice offered to:     DME Arranged:    DME Agency:     HH Arranged:    HH Agency:     Status of Service:  In process, will continue to follow  If discussed at Long Length of Stay Meetings, dates discussed:    Additional Comments:  Zenon Mayo, RN 03/27/2017, 2:18 PM

## 2017-03-27 NOTE — Progress Notes (Signed)
Progress Note  Patient Name: Manuel Rivera Date of Encounter: 03/27/2017  Primary Cardiologist: Dr. Bronson Ing  Subjective   Feeling well. No chest pain, sob or palpitations.   Inpatient Medications    Scheduled Meds: . allopurinol  300 mg Oral Daily  . ALPRAZolam  0.5 mg Oral QHS  . aspirin  325 mg Oral Daily  . colchicine  0.6 mg Oral BID  . feeding supplement (ENSURE ENLIVE)  237 mL Oral BID BM  . [START ON 03/28/2017] furosemide  80 mg Oral Daily  . levothyroxine  50 mcg Oral QAC breakfast  . methylPREDNISolone (SOLU-MEDROL) injection  40 mg Intravenous Q6H  . simvastatin  40 mg Oral QHS  . sodium chloride flush  10-40 mL Intracatheter Q12H   Continuous Infusions: . sodium chloride    . sodium chloride 1 mL/kg/hr (03/26/17 2124)   PRN Meds: acetaminophen **OR** acetaminophen, HYDROcodone-acetaminophen, ipratropium-albuterol, ondansetron **OR** ondansetron (ZOFRAN) IV, sodium chloride flush, sodium chloride flush   Vital Signs    Vitals:   03/26/17 1237 03/26/17 1529 03/26/17 2040 03/27/17 0519  BP: 129/68 133/83 140/70 (!) 108/42  Pulse:  75 76 (!) 56  Resp:  20 18 18   Temp:  97.6 F (36.4 C) 97.9 F (36.6 C) 97.8 F (36.6 C)  TempSrc:  Oral Oral Oral  SpO2:  94% 94% 98%  Weight:      Height:        Intake/Output Summary (Last 24 hours) at 03/27/17 0759 Last data filed at 03/27/17 0300  Gross per 24 hour  Intake           474.32 ml  Output                0 ml  Net           474.32 ml   Filed Weights   03/24/17 1437 03/24/17 2248  Weight: 184 lb (83.5 kg) 186 lb 11.2 oz (84.7 kg)    Telemetry    Sinus rhythm with rate PVCs - Personally Reviewed  ECG    None today   Physical Exam   GEN: No acute distress.   Neck: No JVD Cardiac: RRR, no murmurs, rubs, or gallops.  Respiratory: Clear to auscultation bilaterally. GI: Soft, nontender, non-distended  MS: No edema; No deformity. Neuro:  Nonfocal  Psych: Normal affect   Labs      Chemistry Recent Labs Lab 03/24/17 1512 03/25/17 0543  NA 139 139  K 3.7 4.0  CL 98* 98*  CO2 31 28  GLUCOSE 101* 139*  BUN 22* 24*  CREATININE 1.28* 1.39*  CALCIUM 9.7 9.4  PROT  --  6.8  ALBUMIN  --  3.3*  AST  --  75*  ALT  --  33  ALKPHOS  --  89  BILITOT  --  1.0  GFRNONAA 54* 49*  GFRAA >60 56*  ANIONGAP 10 13     Hematology Recent Labs Lab 03/24/17 1512 03/25/17 0543  WBC 5.0 3.6*  RBC 4.21* 3.98*  HGB 13.7 12.7*  HCT 41.8 38.9*  MCV 99.3 97.7  MCH 32.5 31.9  MCHC 32.8 32.6  RDW 16.3* 15.9*  PLT 199 196    Cardiac Enzymes Recent Labs Lab 03/24/17 1728 03/24/17 2359 03/25/17 0543 03/25/17 1314  TROPONINI 9.05* 7.01* 6.09* 5.50*   No results for input(s): TROPIPOC in the last 168 hours.   BNP Recent Labs Lab 03/24/17 1512  BNP 620.0*     DDimer  Recent Labs Lab  03/24/17 1801  DDIMER 0.79*     Radiology    Nm Myocar Multi W/spect W/wall Motion / Ef  Result Date: 03/26/2017  Findings consistent with ischemia and prior myocardial infarction.  This is a high risk study.  The left ventricular ejection fraction is moderately decreased (30-44%).  There was no ST segment deviation noted during stress.  Large inferior lateral scar with significant anterolateral wall ischemia EF 44% with LVE    Cardiac Studies   Echo 03/25/17: Study Conclusions  - Left ventricle: The cavity size was normal. There was mild concentric hypertrophy. Systolic function was normal. The estimated ejection fraction was in the range of 55% to 60%. Wall motion was normal; there were no regional wall motion abnormalities. Doppler parameters are consistent with abnormal left ventricular relaxation (grade 1 diastolic dysfunction). - Aortic valve: Trileaflet; mildly thickened, moderately calcified leaflets. There was mild regurgitation. - Mitral valve: Moderately calcified annulus. There was mild regurgitation.  Patient Profile     Mr. Peace is  a 61M with small cell lung cancer s/p XRT and chemotherapy, CAD s/p CABG, hyperlipidemia, CKD and PAD here with subdural hematoma.  Cardiology was consulted for syncope and elevated troponin.  Assessment & Plan    1. Elevated troponin/NSETMI - Peak of troponin 9. Trending down. No chest pain. Stress test high risk --> finding consistent with ischemia.Dr. Oval Linsey to discuss with Dr. Annette Stable regarding cath and antiplatelet therapy.  - Plan for cath today.   2. Syncope - Occurred 3 weeks ago without prodrome. No palpitations. Tele unremarkable. We will plan for a 30 day event monitor at discharge.  3. Subdural hematoma - s/p fall. Seen by Dr. Annette Stable. No intervention needed. F/u as outpatient.   4. Chronic diastolic CHF - Echo showed normal LVEF with grade 1 DD. He is euvolemic. On home dose of lasix 80mg  qd. Scr at baseline 1.3-1.4. Will hold lasix today. Resume tomorrow, likely at 40mg  qd. Start BMET and CBC today.   5. CKD stage III - Scr stable. Followed closely pending cath.   Signed, Leanor Kail, PA  03/27/2017, 7:59 AM

## 2017-03-27 NOTE — Progress Notes (Signed)
Patient underwent coronary angiogram today demonstrating significant occlusive disease. Patient underwent placement of bare metal stent with reconstitution of flow. Postprocedure patient noted some transient visual complaints. Currently she reports no visual symptoms. He's having no headaches. He's having no symptoms of numbness, paresthesias or weakness.  On examination he is awake and alert. His speech was fluid. His judgment and insight are intact. Cranial nerve function normal bilaterally. Motor examination of his extremities demonstrates 5/5 strength bilateral upper and lower extremities without evidence of pronator drift. Sensory examination nonfocal.  I reviewed the postprocedure CT scan of his head. This demonstrates stable size of his right-sided extra-axial fluid collection however there is now the appearance of hyperdense blood within the subdural fluid collection. There is no evidence of mass effect.  The patient has a chronic right-sided convexity subdural hematoma with evidence of some mild acute hemorrhage. Given his recent cardiac procedure he needs to remain on Plavix if at all possible. Plan to repeat head CT scan tomorrow.

## 2017-03-27 NOTE — H&P (View-Only) (Signed)
Progress Note  Patient Name: Manuel Rivera Date of Encounter: 03/27/2017  Primary Cardiologist: Dr. Bronson Ing  Subjective   Feeling well. No chest pain, sob or palpitations.   Inpatient Medications    Scheduled Meds: . allopurinol  300 mg Oral Daily  . ALPRAZolam  0.5 mg Oral QHS  . aspirin  325 mg Oral Daily  . colchicine  0.6 mg Oral BID  . feeding supplement (ENSURE ENLIVE)  237 mL Oral BID BM  . [START ON 03/28/2017] furosemide  80 mg Oral Daily  . levothyroxine  50 mcg Oral QAC breakfast  . methylPREDNISolone (SOLU-MEDROL) injection  40 mg Intravenous Q6H  . simvastatin  40 mg Oral QHS  . sodium chloride flush  10-40 mL Intracatheter Q12H   Continuous Infusions: . sodium chloride    . sodium chloride 1 mL/kg/hr (03/26/17 2124)   PRN Meds: acetaminophen **OR** acetaminophen, HYDROcodone-acetaminophen, ipratropium-albuterol, ondansetron **OR** ondansetron (ZOFRAN) IV, sodium chloride flush, sodium chloride flush   Vital Signs    Vitals:   03/26/17 1237 03/26/17 1529 03/26/17 2040 03/27/17 0519  BP: 129/68 133/83 140/70 (!) 108/42  Pulse:  75 76 (!) 56  Resp:  20 18 18   Temp:  97.6 F (36.4 C) 97.9 F (36.6 C) 97.8 F (36.6 C)  TempSrc:  Oral Oral Oral  SpO2:  94% 94% 98%  Weight:      Height:        Intake/Output Summary (Last 24 hours) at 03/27/17 0759 Last data filed at 03/27/17 0300  Gross per 24 hour  Intake           474.32 ml  Output                0 ml  Net           474.32 ml   Filed Weights   03/24/17 1437 03/24/17 2248  Weight: 184 lb (83.5 kg) 186 lb 11.2 oz (84.7 kg)    Telemetry    Sinus rhythm with rate PVCs - Personally Reviewed  ECG    None today   Physical Exam   GEN: No acute distress.   Neck: No JVD Cardiac: RRR, no murmurs, rubs, or gallops.  Respiratory: Clear to auscultation bilaterally. GI: Soft, nontender, non-distended  MS: No edema; No deformity. Neuro:  Nonfocal  Psych: Normal affect   Labs      Chemistry Recent Labs Lab 03/24/17 1512 03/25/17 0543  NA 139 139  K 3.7 4.0  CL 98* 98*  CO2 31 28  GLUCOSE 101* 139*  BUN 22* 24*  CREATININE 1.28* 1.39*  CALCIUM 9.7 9.4  PROT  --  6.8  ALBUMIN  --  3.3*  AST  --  75*  ALT  --  33  ALKPHOS  --  89  BILITOT  --  1.0  GFRNONAA 54* 49*  GFRAA >60 56*  ANIONGAP 10 13     Hematology Recent Labs Lab 03/24/17 1512 03/25/17 0543  WBC 5.0 3.6*  RBC 4.21* 3.98*  HGB 13.7 12.7*  HCT 41.8 38.9*  MCV 99.3 97.7  MCH 32.5 31.9  MCHC 32.8 32.6  RDW 16.3* 15.9*  PLT 199 196    Cardiac Enzymes Recent Labs Lab 03/24/17 1728 03/24/17 2359 03/25/17 0543 03/25/17 1314  TROPONINI 9.05* 7.01* 6.09* 5.50*   No results for input(s): TROPIPOC in the last 168 hours.   BNP Recent Labs Lab 03/24/17 1512  BNP 620.0*     DDimer  Recent Labs Lab  03/24/17 1801  DDIMER 0.79*     Radiology    Nm Myocar Multi W/spect W/wall Motion / Ef  Result Date: 03/26/2017  Findings consistent with ischemia and prior myocardial infarction.  This is a high risk study.  The left ventricular ejection fraction is moderately decreased (30-44%).  There was no ST segment deviation noted during stress.  Large inferior lateral scar with significant anterolateral wall ischemia EF 44% with LVE    Cardiac Studies   Echo 03/25/17: Study Conclusions  - Left ventricle: The cavity size was normal. There was mild concentric hypertrophy. Systolic function was normal. The estimated ejection fraction was in the range of 55% to 60%. Wall motion was normal; there were no regional wall motion abnormalities. Doppler parameters are consistent with abnormal left ventricular relaxation (grade 1 diastolic dysfunction). - Aortic valve: Trileaflet; mildly thickened, moderately calcified leaflets. There was mild regurgitation. - Mitral valve: Moderately calcified annulus. There was mild regurgitation.  Patient Profile     Manuel Rivera is  a 28M with small cell lung cancer s/p XRT and chemotherapy, CAD s/p CABG, hyperlipidemia, CKD and PAD here with subdural hematoma.  Cardiology was consulted for syncope and elevated troponin.  Assessment & Plan    1. Elevated troponin/NSETMI - Peak of troponin 9. Trending down. No chest pain. Stress test high risk --> finding consistent with ischemia.Dr. Oval Linsey to discuss with Dr. Annette Stable regarding cath and antiplatelet therapy.  - Plan for cath today.   2. Syncope - Occurred 3 weeks ago without prodrome. No palpitations. Tele unremarkable. We will plan for a 30 day event monitor at discharge.  3. Subdural hematoma - s/p fall. Seen by Dr. Annette Stable. No intervention needed. F/u as outpatient.   4. Chronic diastolic CHF - Echo showed normal LVEF with grade 1 DD. He is euvolemic. On home dose of lasix 80mg  qd. Scr at baseline 1.3-1.4. Will hold lasix today. Resume tomorrow, likely at 40mg  qd. Start BMET and CBC today.   5. CKD stage III - Scr stable. Followed closely pending cath.   Signed, Manuel Kail, PA  03/27/2017, 7:59 AM

## 2017-03-27 NOTE — Progress Notes (Signed)
Called to evaluate Manuel Rivera for new onset of vision loss.  He reports seeing white spot in the RUQ.  Neuro exam is otherwise unremarkable.  5/5 strength throughout proximally and distally. Sensation in tact to LT.  CN II-XII in tact.  He has trouble tracking fingers in the RUQ with both eyes.  Stat head CT ordered to evaluate for expansion of subdural hematoma.  He had a bare metal stent placed in vein graft to PDA.  All other vein grafts were occluded.  LIMA was patent.  He is being treated with clopidogrel only.  Received Angiomax in cath lab.   Manuel Rivera C. Manuel Linsey, MD, [facility]  03/27/2017 1:45 PM

## 2017-03-27 NOTE — Interval H&P Note (Signed)
History and Physical Interval Note:  03/27/2017 9:05 AM  Manuel Rivera  has presented today for surgery, with the diagnosis of Syncope with  + Troponin, Abnormal Myoview & H/o CAD-CABG.  The various methods of treatment have been discussed with the patient and family. After consideration of risks, benefits and other options for treatment, the patient has consented to  Procedure(s): Left Heart Cath and Cors/Grafts Angiography (N/A) as a surgical intervention .  The patient's history has been reviewed, patient examined, no change in status, stable for surgery.  I have reviewed the patient's chart and labs.  Questions were answered to the patient's satisfaction.    Cath Lab Visit (complete for each Cath Lab visit)  Clinical Evaluation Leading to the Procedure:   ACS: Yes.    Non-ACS:    Anginal Classification: No Symptoms  Anti-ischemic medical therapy: No Therapy  Non-Invasive Test Results: High-risk stress test findings: cardiac mortality >3%/year  Prior CABG: Previous CABG  Glenetta Hew

## 2017-03-28 ENCOUNTER — Telehealth: Payer: Self-pay

## 2017-03-28 ENCOUNTER — Inpatient Hospital Stay (HOSPITAL_COMMUNITY): Payer: PPO

## 2017-03-28 ENCOUNTER — Encounter (HOSPITAL_COMMUNITY): Payer: Self-pay | Admitting: *Deleted

## 2017-03-28 DIAGNOSIS — E7800 Pure hypercholesterolemia, unspecified: Secondary | ICD-10-CM

## 2017-03-28 LAB — CBC
HCT: 35.1 % — ABNORMAL LOW (ref 39.0–52.0)
HEMOGLOBIN: 11.5 g/dL — AB (ref 13.0–17.0)
MCH: 32 pg (ref 26.0–34.0)
MCHC: 32.8 g/dL (ref 30.0–36.0)
MCV: 97.8 fL (ref 78.0–100.0)
PLATELETS: 197 10*3/uL (ref 150–400)
RBC: 3.59 MIL/uL — AB (ref 4.22–5.81)
RDW: 16.1 % — ABNORMAL HIGH (ref 11.5–15.5)
WBC: 8.4 10*3/uL (ref 4.0–10.5)

## 2017-03-28 LAB — BASIC METABOLIC PANEL
Anion gap: 9 (ref 5–15)
BUN: 32 mg/dL — ABNORMAL HIGH (ref 6–20)
CALCIUM: 8.2 mg/dL — AB (ref 8.9–10.3)
CHLORIDE: 103 mmol/L (ref 101–111)
CO2: 27 mmol/L (ref 22–32)
CREATININE: 1.38 mg/dL — AB (ref 0.61–1.24)
GFR calc non Af Amer: 49 mL/min — ABNORMAL LOW (ref >60)
GFR, EST AFRICAN AMERICAN: 57 mL/min — AB (ref >60)
Glucose, Bld: 151 mg/dL — ABNORMAL HIGH (ref 65–99)
Potassium: 3.3 mmol/L — ABNORMAL LOW (ref 3.5–5.1)
SODIUM: 139 mmol/L (ref 135–145)

## 2017-03-28 LAB — LIPID PANEL
Cholesterol: 121 mg/dL (ref 0–200)
HDL: 30 mg/dL — ABNORMAL LOW (ref >40)
LDL Cholesterol: 71 mg/dL (ref 0–99)
Total CHOL/HDL Ratio: 4 ratio
Triglycerides: 98 mg/dL (ref <150)
VLDL: 20 mg/dL (ref 0–40)

## 2017-03-28 MED ORDER — POTASSIUM CHLORIDE CRYS ER 20 MEQ PO TBCR
40.0000 meq | EXTENDED_RELEASE_TABLET | Freq: Every day | ORAL | Status: DC
  Administered 2017-03-28: 40 meq via ORAL
  Filled 2017-03-28: qty 2

## 2017-03-28 MED ORDER — NITROGLYCERIN 0.4 MG SL SUBL: 0.4000 mg | SUBLINGUAL_TABLET | SUBLINGUAL | 0 refills | Status: AC | PRN

## 2017-03-28 MED ORDER — METOPROLOL TARTRATE 25 MG PO TABS
25.0000 mg | ORAL_TABLET | Freq: Two times a day (BID) | ORAL | Status: DC
  Administered 2017-03-28: 10:00:00 25 mg via ORAL
  Filled 2017-03-28: qty 1

## 2017-03-28 MED ORDER — CLOPIDOGREL BISULFATE 75 MG PO TABS: 75.0000 mg | ORAL_TABLET | Freq: Every day | ORAL | 0 refills | Status: DC

## 2017-03-28 MED ORDER — DOXYCYCLINE HYCLATE 100 MG PO TABS: 100.0000 mg | ORAL_TABLET | Freq: Two times a day (BID) | ORAL | 0 refills | Status: AC

## 2017-03-28 MED ORDER — METOPROLOL TARTRATE 25 MG PO TABS: 25.0000 mg | ORAL_TABLET | Freq: Two times a day (BID) | ORAL | 0 refills | Status: DC

## 2017-03-28 MED ORDER — ANGIOPLASTY BOOK
Freq: Once | Status: AC
  Administered 2017-03-28: 01:00:00
  Filled 2017-03-28: qty 1

## 2017-03-28 MED ORDER — HEPARIN SOD (PORK) LOCK FLUSH 100 UNIT/ML IV SOLN
500.0000 [IU] | INTRAVENOUS | Status: AC | PRN
  Administered 2017-03-28: 17:00:00 500 [IU]

## 2017-03-28 MED FILL — Lidocaine HCl Local Preservative Free (PF) Inj 1%: INTRAMUSCULAR | Qty: 30 | Status: AC

## 2017-03-28 NOTE — Telephone Encounter (Signed)
-----   Message from Reece Levy sent at 03/28/2017 12:31 PM EDT ----- TCM apt on Thursday 04/03/17 w. Bronson Ing   Thank you

## 2017-03-28 NOTE — Progress Notes (Signed)
CARDIAC REHAB PHASE I   PRE:  Rate/Rhythm: 55 SR    BP: sitting 130/66    SaO2: 98 with breathing treatment  MODE:  Ambulation: 400 ft, 100 ft later   POST:  Rate/Rhythm: 96 SR    BP: sitting 180/73     SaO2: 97 RA  Pt eager to walk. Fairly steady, did have slight sway at times. SOB with distance. SaO2 stable. BP elevated after walking. Ed completed with pt and wife. Voiced understanding regarding Plavix, ex, NTG, and CRPII. Pt eager to do all that he needs to do. Will refer to [place]. Pt can walk in hall independently or with wife.  [place], ACSM 03/28/2017 9:36 AM

## 2017-03-28 NOTE — Progress Notes (Signed)
CT scan yesterday evening raised question of possible acute bleeding into right convexity chronic subdural hematoma. Follow-up head CT scan this morning demonstrates resolution of this questionable acute bleeding. I believe that the previous finding yesterday was artifactual secondary to staining of the subdural space with intravascular contrast given during his coronary arteriogram. Patient is very low risk for rehemorrhage. He may be mobilized ad lib.

## 2017-03-28 NOTE — Care Management Important Message (Signed)
Important Message  Patient Details  Name: Manuel Rivera MRN: [id number] Date of Birth: 01-18-1946   Medicare Important Message Given:  Yes    Orbie Pyo 03/28/2017, 12:05 PM

## 2017-03-28 NOTE — Telephone Encounter (Signed)
Patient contacted regarding discharge from [facility] on 03/28/17.  Patient understands to follow up with provider Koneswaran on 04/03/17 at 2 pm at [facility]. Patient understands discharge instructions?  Patient understands medications and regiment?  Patient understands to bring all medications to this visit? yes  I spoke with daughter who is a Marine scientist, she will have patient call us if he has any questions regarding discharge instructions.He is not home yet from hospital

## 2017-03-28 NOTE — Care Management Note (Signed)
Case Management Note  Patient Details  Name: Manuel Rivera MRN: [id number] Date of Birth: 1946/03/13  Subjective/Objective:  From home with wife, pta indep, s/p coronary stent intervention ,will be on plavx , per pt/ot eval no pt/ot follow up needed.    PCP Rory Percy                              Action/Plan:   Expected Discharge Date:                  Expected Discharge Plan:  Home/Self Care  In-House Referral:     Discharge planning Services  CM Consult  Post Acute Care Choice:    Choice offered to:     DME Arranged:    DME Agency:     HH Arranged:    [place] Agency:     Status of Service:  Completed, signed off  If discussed at [place] of Stay Meetings, dates discussed:    Additional Comments:  Zenon Mayo, RN 03/28/2017, 8:17 AM

## 2017-03-28 NOTE — Discharge Summary (Addendum)
Physician Discharge Summary  Manuel Rivera ZTI:[id number] DOB: 1946-09-09 DOA: 03/24/2017  PCP: Rory Percy, MD  Admit date: 03/24/2017 Discharge date: 03/28/2017  Time spent: > 35 minutes  Recommendations for Outpatient Follow-up:  1. Monitor potassium levels 2. Ensure patient has follow-up with neurosurgery 3. Ensure patient follows up with cardiology 4. Patient to be discharged on Plavix but not aspirin   Discharge Diagnoses:  Active Problems:   CAD, ARTERY BYPASS GRAFT   PVD   Small cell lung cancer (HCC)   ACS (acute coronary syndrome) (HCC)   Subdural hematoma (HCC)   Elevated troponin   Coronary artery disease involving native coronary artery of native heart without angina pectoris   Syncope   Congestive heart failure (HCC)   NSTEMI (non-ST elevation myocardial infarction) [facility])   Discharge Condition: stable  Diet recommendation: Heart healthy  Filed Weights   03/24/17 1437 03/24/17 2248 03/28/17 0159  Weight: 83.5 kg (184 lb) 84.7 kg (186 lb 11.2 oz) 83.6 kg (184 lb 4.9 oz)    History of present illness:  71 year old male with history of small cell lung cancer diagnosed in 2014 with radiation and chemotherapy, coronary artery disease, hypothyroidism, hyperlipidemia, chronic kidney disease, peripheral vascular disease had presented to the cardiology office for evaluation of syncope. While at the office, patient's primary care physician called relating information of MRI done on 03/09/2017 showing a subdural hematoma and requested patient go to emergency department. Patient presented to [facility] emergency department, then transferred to [facility] for neurosurgery and cardiology consultations  Hospital Course:  Elevated troponin/known CAD - Management per cardiology while in house -Pt is s/p cardiac Cath. Cardiology recommended the following: Troponin was elevated to 9 on admission.  He underwent left heart catheterization 03/27/17 that revealed his vein graft to the  ramus and OM were both 100% occluded. His LIMA to the LAD was patent but distal vessels were small. There is 80% stenosis of the vein graft to the PDA, which is also providing collaterals to the left. He underwent successful stenting with a bare metal stent with plans for clopidogrel 75 mg daily only, given his subdural hematoma.  No aspirin.  Blood pressure has been elevated. He does not have a history of hypertension. This may be due to the fact that he is currently receiving steroids.  We will start metoprolol 25 mg twice daily.  Check fasting lipid panel. He was previously on simvastatin.  We will switch this to rosuvastatin 40 mg daily.  We will still plan for a 30 day event monitor at discharge.   Subdural hematoma -s/p fall which occurred approximately one month ago, fell off of a ladder -MRI brain done prior to admission, showed small chronic right convexity subdural hematoma with minimal mass effect -CT head: 9 mm thick subdural collection along the right convexity, no midline shift or mass effect. -Neurosurgery consulted recommended the following:  CT scan yesterday evening raised question of possible acute bleeding into right convexity chronic subdural hematoma. Follow-up head CT scan this morning demonstrates resolution of this questionable acute bleeding. I believe that the previous finding yesterday was artifactual secondary to staining of the subdural space with intravascular contrast given during his coronary arteriogram. Patient is very low risk for rehemorrhage. He may be mobilized ad lib.   Acute bronchitis -Continue Solu-Medrol, Mucinex, nebulizer treatments -Patient recently diagnosed and treated for pneumonia 3 weeks ago  Syncope -Cardiology consult and appreciated, patient was supposed to have an event monitor. Recommend patient f/u with cardiology  for this. - PT and OT  Acute diastolic CHF -Echocardiogram as below -Continue Lasix -Monitor intake and output, daily  weights  Orthopnea -Possibly related to acute bronchitis/chest congestion and recent pneumonia versus CHF component  Hypothyroidism -Continue Synthroid   Procedures: Echo 03/25/17: Study Conclusions  - Left ventricle: The cavity size was normal. There was mild concentric hypertrophy. Systolic function was normal. The estimated ejection fraction was in the range of 55% to 60%. Wall motion was normal; there were no regional wall motion abnormalities. Doppler parameters are consistent with abnormal left ventricular relaxation (grade 1 diastolic dysfunction). - Aortic valve: Trileaflet; mildly thickened, moderately calcified leaflets. There was mild regurgitation. - Mitral valve: Moderately calcified annulus. There was mild regurgitation.   LHC 03/27/17:  Severe Native Vessel CAD:  Ost LAD to Mid LAD lesion, 100 %stenosed.  Ost Ramus lesion, 100 %stenosed.  Prox Cx lesion, 70 %stenosed. Ost 1st Mrg to 1st Mrg lesion, 95 %stenosed. Ost 2nd Mrg lesion, 80 %stenosed.  Ost RCA to Dist RCA lesion, 100 %stenosed. Post Atrio lesion, 99 %stenosed (filled via retrograde flow from SV-G-PDA)  Mid Cx lesion, 100 %stenosed. After small OM2.  LIMA-LAD graft was visualized by angiography and is normal in caliber and anatomically normal. Grafted LAD is relatively small with 2 small diagonals.  SVG-OM graft was not visualized by non-selective angiography (root angiogram). Origin lesion, 100 %stenosed.  SVG-RI graft was visualized by angiography and 100% occluded. Origin lesion, 100 %stenosed.  SVG-rPDA graft was visualized by angiography and is large. Mid SVG-RCA lesion, 80 %stenosed.  Collaterals from the grafted PDA fill 2 branches of an OM as well as the ramus.  Culprit Lesion: Mid SVG-RCA lesion, 80 %stenosed  A STENT VISION RX [id number] bare metal stent was successfully placed. Using distal protection device  Post intervention, there is a 5% residual stenosis.  LV  end diastolic pressure is moderately elevated.  Consultations:  Cardiology: Dr. Oval Linsey  Neurosurgery: Dr. Annette Stable  Discharge Exam: Vitals:   03/28/17 1230 03/28/17 1500  BP: (!) 127/56 118/64  Pulse: 60 (!) 58  Resp: 13 15  Temp: 98.3 F (36.8 C) 98.2 F (36.8 C)    General: Pt in nad, alert and awake Cardiovascular: rrr, no rubs Respiratory: no increased wob, no wheezes  Discharge Instructions   Discharge Instructions    Amb Referral to Cardiac Rehabilitation    Complete by:  As directed    Diagnosis:   Coronary Stents PTCA NSTEMI     Call MD for:  extreme fatigue    Complete by:  As directed    Call MD for:  severe uncontrolled pain    Complete by:  As directed    Call MD for:  temperature >100.4    Complete by:  As directed    Diet - low sodium heart healthy    Complete by:  As directed    Discharge instructions    Complete by:  As directed    Please call the neurosurgeon's office after discharge to confirm follow up appointment date and time.  Also called the cardiologist's office after discharge to confirm follow up appointment date and time   Increase activity slowly    Complete by:  As directed      Current Discharge Medication List    START taking these medications   Details  clopidogrel (PLAVIX) 75 MG tablet Take 1 tablet (75 mg total) by mouth daily with breakfast. Qty: 30 tablet, Refills: 0    doxycycline (VIBRA-TABS) 100 MG  tablet Take 1 tablet (100 mg total) by mouth 2 (two) times daily. Qty: 14 tablet, Refills: 0    metoprolol tartrate (LOPRESSOR) 25 MG tablet Take 1 tablet (25 mg total) by mouth 2 (two) times daily. Qty: 60 tablet, Refills: 0    nitroGLYCERIN (NITROSTAT) 0.4 MG SL tablet Place 1 tablet (0.4 mg total) under the tongue every 5 (five) minutes as needed for chest pain. Qty: 50 tablet, Refills: 0      CONTINUE these medications which have NOT CHANGED   Details  ADVAIR DISKUS 100-50 MCG/DOSE AEPB Inhale 1 puff into the  lungs 2 (two) times daily. Refills: 12    albuterol (PROVENTIL HFA;VENTOLIN HFA) 108 (90 Base) MCG/ACT inhaler Inhale 1-2 puffs into the lungs every 6 (six) hours as needed for wheezing or shortness of breath.    allopurinol (ZYLOPRIM) 300 MG tablet Take 300 mg by mouth daily. Refills: 3    ALPRAZolam (XANAX) 0.5 MG tablet Take 1 mg by mouth at bedtime.  Refills: 5    beta carotene 25000 UNIT capsule Take by mouth.   Associated Diagnoses: Small cell lung cancer, left (HCC)    calcium carbonate (OSCAL) 1500 (600 Ca) MG TABS tablet Take 600 mg of elemental calcium by mouth 2 (two) times daily with a meal.    colchicine 0.6 MG tablet Take 0.6 mg by mouth 2 (two) times daily. Refills: 12    furosemide (LASIX) 80 MG tablet Take 80 mg by mouth daily. Refills: 3    HYDROcodone-acetaminophen (NORCO/VICODIN) 5-325 MG tablet Take 1 tablet by mouth every 4 (four) hours as needed for moderate pain. Qty: 30 tablet, Refills: 0   Associated Diagnoses: Small cell lung cancer, left ([place]); Port catheter in place    levothyroxine (SYNTHROID, LEVOTHROID) 50 MCG tablet Take 50 mcg by mouth every morning. Refills: 3    magic mouthwash SOLN TAKE ONE TEASPOONFUL (5ML) BY MOUTH FOUR TIMES DAILY FOR SORES IN MOUTH FROM CHEMOTHEROPY AS NEEDED Refills: 99    Multiple Vitamin (MULTIVITAMIN WITH MINERALS) TABS tablet Take 1 tablet by mouth daily.    potassium chloride SA (K-DUR,KLOR-CON) 20 MEQ tablet TAKE TWO (2) TABLETS BY MOUTH DAILY. Qty: 60 tablet, Refills: 1   Associated Diagnoses: Hypokalemia    simvastatin (ZOCOR) 40 MG tablet Take 40 mg by mouth at bedtime. Refills: 3    tiotropium (SPIRIVA) 18 MCG inhalation capsule Place 18 mcg into inhaler and inhale at bedtime.        No Known Allergies Follow-up Information    Herminio Commons, MD Follow up on 04/03/2017.   Specialty:  Cardiology Why:  Please follow-up for a 2pm appointment with Dr. Bronson Ing. Please arrive 15 minutes  early. Contact information: [place] [address] [phone]            The results of significant diagnostics from this hospitalization (including imaging, microbiology, ancillary and laboratory) are listed below for reference.    Significant Diagnostic Studies: Dg Chest 2 View  Result Date: 03/28/2017 CLINICAL DATA:  Cough and wheezing EXAM: CHEST  2 VIEW COMPARISON:  03/24/2017 FINDINGS: Cardiac shadow is stable. Left chest wall port is again seen and stable. Fullness in the left hilum is noted stable from the prior exam. Bibasilar infiltrates are seen new from the prior study. No sizable effusion is seen. No bony abnormality is noted. IMPRESSION: New bibasilar infiltrates. Electronically Signed   By: Inez Catalina M.D.   On: 03/28/2017 07:12   Dg Chest 2 View  Result Date: 03/24/2017 CLINICAL DATA:  History of lung cancer.  Shortness of breath. EXAM: CHEST  2 VIEW COMPARISON:  01/29/2017 FINDINGS: Injectable Port-A-Cath is in stable position. Postsurgical changes from CABG, with re- demonstrated fractured sternal wires. The cardiac silhouette is normal. There is a relatively stable radiographically left suprahilar and upper lobe spiculated density. No evidence of airspace consolidation. Osseous structures are without acute abnormality. Soft tissues are grossly normal. IMPRESSION: Relatively stable radiographically left suprahilar and left upper lobe spiculated density. This may represent known post radiation changes, however focal recurrence cannot be excluded. Otherwise no evidence of focal airspace consolidation. Electronically Signed   By: Fidela Salisbury M.D.   On: 03/24/2017 15:39   Ct Head Wo Contrast  Result Date: 03/28/2017 CLINICAL DATA:  Followup subdural hematoma. EXAM: CT HEAD WITHOUT CONTRAST TECHNIQUE: Contiguous axial images were obtained from the base of the skull through the vertex without intravenous contrast. COMPARISON:  Yesterday FINDINGS: Brain:  Surprising decreased density of subdural collection around the right cerebral convexity, with stable size of 9 mm maximal thickness. This may reflect CSF dilution of the hemorrhage. No midline shift. Minimal if any cortical mass effect. There is generalized atrophy with periventricular chronic microvascular ischemic change. No evidence of acute infarct. Vascular: Atherosclerotic calcification. Skull: Negative for fracture Sinuses/Orbits: Negative IMPRESSION: Size stable right cerebral convexity subdural hematoma measuring up to 9 mm thickness. Hematoma density has decreased. No midline shift. Electronically Signed   By: Monte Fantasia M.D.   On: 03/28/2017 08:04   Ct Head Wo Contrast  Result Date: 03/27/2017 CLINICAL DATA:  Fall 1 week ago.  Right-sided visual defects. EXAM: CT HEAD WITHOUT CONTRAST TECHNIQUE: Contiguous axial images were obtained from the base of the skull through the vertex without intravenous contrast. COMPARISON:  Head CT 03/24/2017 FINDINGS: Brain: There is an acute right convexity subdural hematoma measuring 9 mm. This is superimposed on the previously demonstrated chronic subdural hematoma. There is no associated mass effect or midline shift. There is periventricular hypoattenuation compatible with chronic microvascular disease. Vascular: No hyperdense vessel or unexpected calcification. Skull: Normal visualized skull base, calvarium and extracranial soft tissues. Sinuses/Orbits: No sinus fluid levels or advanced mucosal thickening. No mastoid effusion. Normal orbits. IMPRESSION: Acute subdural hematoma measuring 9 mm over the right convexity. No associated midline shift or mass effect. Critical Value/emergent results were called by telephone at the time of interpretation on 03/27/2017 at 3:50 pm to Dr. Jonelle Sidle [facility] , who verbally acknowledged these results. Electronically Signed   By: Ulyses Jarred M.D.   On: 03/27/2017 16:06   Ct Head Wo Contrast  Result Date: 03/24/2017 CLINICAL  DATA:  Syncopal episode EXAM: CT HEAD WITHOUT CONTRAST TECHNIQUE: Contiguous axial images were obtained from the base of the skull through the vertex without intravenous contrast. COMPARISON:  MRI 03/19/2017 FINDINGS: Brain: No acute territorial infarction or intracranial mass is seen. Slightly complex right subdural collection, measuring 9 mm maximum thickness on coronal views and probably not significantly changed compared with MRI 03/19/2017. No midline shift. Mild periventricular white matter small vessel ischemic changes. Atrophy. Stable ventricle size. Vascular: No hyperdense vessels. Carotid artery calcifications. Vertebral artery calcifications. Skull: No fracture or suspicious bone lesion. Sinuses/Orbits: No acute finding. Other: None IMPRESSION: 1. 9 mm thick subdural collection along the right convexity. No midline shift or significant mass effect. 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 No definite acute hemorrhage is seen at this  time. 2. Mild white matter small vessel ischemic changes. Electronically Signed   By: Donavan Foil M.D.   On: 03/24/2017 19:03   Nm Myocar Multi W/spect W/wall Motion / Ef  Result Date: 03/26/2017  Findings consistent with ischemia and prior myocardial infarction.  This is a high risk study.  The left ventricular ejection fraction is moderately decreased (30-44%).  There was no ST segment deviation noted during stress.  Large inferior lateral scar with significant anterolateral wall ischemia EF 44% with LVE    Microbiology: No results found for this or any previous visit (from the past 240 hour(s)).   Labs: Basic Metabolic Panel:  Recent Labs Lab 03/24/17 1512 03/25/17 0543 03/27/17 0827 03/28/17 0420  NA 139 139 139 139  K 3.7 4.0 3.5 3.3*  CL 98* 98* 100* 103  CO2 31 28 28 27   GLUCOSE 101* 139* 141* 151*  BUN 22* 24* 42* 32*  CREATININE  1.28* 1.39* 1.44* 1.38*  CALCIUM 9.7 9.4 8.9 8.2*   Liver Function Tests:  Recent Labs Lab 03/25/17 0543  AST 75*  ALT 33  ALKPHOS 89  BILITOT 1.0  PROT 6.8  ALBUMIN 3.3*   No results for input(s): LIPASE, AMYLASE in the last 168 hours. No results for input(s): AMMONIA in the last 168 hours. CBC:  Recent Labs Lab 03/24/17 1512 03/25/17 0543 03/27/17 0827 03/28/17 0420  WBC 5.0 3.6* 8.6 8.4  NEUTROABS 3.4  --   --   --   HGB 13.7 12.7* 13.1 11.5*  HCT 41.8 38.9* 38.5* 35.1*  MCV 99.3 97.7 97.2 97.8  PLT 199 196 238 197   Cardiac Enzymes:  Recent Labs Lab 03/24/17 1728 03/24/17 2359 03/25/17 0543 03/25/17 1314  TROPONINI 9.05* 7.01* 6.09* 5.50*   BNP: BNP (last 3 results)  Recent Labs  03/24/17 1512  BNP 620.0*    ProBNP (last 3 results) No results for input(s): PROBNP in the last 8760 hours.  CBG: No results for input(s): GLUCAP in the last 168 hours.   Signed:  Velvet Bathe MD.  [facility] 03/28/2017, 4:47 PM

## 2017-03-28 NOTE — Progress Notes (Signed)
Progress Note  Patient Name: Manuel Rivera Date of Encounter: 03/28/2017  Primary Cardiologist: Dr. Bronson Ing  Subjective   Feels well.  Chest pain or shortness of breath.  Inpatient Medications    Scheduled Meds: . allopurinol  300 mg Oral Daily  . ALPRAZolam  0.5 mg Oral QHS  . aspirin EC  81 mg Oral Daily  . clopidogrel  75 mg Oral Q breakfast  . colchicine  0.6 mg Oral BID  . feeding supplement (ENSURE ENLIVE)  237 mL Oral BID BM  . ipratropium-albuterol  3 mL Nebulization Q6H  . levothyroxine  50 mcg Oral QAC breakfast  . methylPREDNISolone (SOLU-MEDROL) injection  40 mg Intravenous Q6H  . simvastatin  40 mg Oral QHS  . sodium chloride flush  10-40 mL Intracatheter Q12H  . sodium chloride flush  3 mL Intravenous Q12H   Continuous Infusions: . sodium chloride     PRN Meds: sodium chloride, acetaminophen **OR** acetaminophen, HYDROcodone-acetaminophen, ipratropium-albuterol, ondansetron **OR** ondansetron (ZOFRAN) IV, sodium chloride flush, sodium chloride flush, sodium chloride flush   Vital Signs    Vitals:   03/27/17 2335 03/28/17 0102 03/28/17 0159 03/28/17 0700  BP: (!) 151/69  134/87 (!) 153/72  Pulse: 82  83 70  Resp: 18  19 17   Temp: 98.4 F (36.9 C)  98.2 F (36.8 C) 97.9 F (36.6 C)  TempSrc: Oral  Oral Oral  SpO2: 92% 93% 90% 93%  Weight:   83.6 kg (184 lb 4.9 oz)   Height:        Intake/Output Summary (Last 24 hours) at 03/28/17 0910 Last data filed at 03/28/17 0700  Gross per 24 hour  Intake             1585 ml  Output              900 ml  Net              685 ml   Filed Weights   03/24/17 1437 03/24/17 2248 03/28/17 0159  Weight: 83.5 kg (184 lb) 84.7 kg (186 lb 11.2 oz) 83.6 kg (184 lb 4.9 oz)    Telemetry    Sinus rhythm. Occasional PVCs. - Personally Reviewed  ECG    Sinus rhythm. Rate 81 bpm. Nonspecific ST changes.  - Personally Reviewed  Physical Exam   GEN: No acute distress.   Neck: No JVD Cardiac: RRR, no  murmurs, rubs, or gallops. Normal S1/S2.  Respiratory: Clear to auscultation bilaterally.No crackles, wheezes, or rhonchi.    GI: Soft, nontender, non-distended  MS: No edema; No deformity. Neuro:  Nonfocal  Psych: Normal affect   Ext: Tight groin without hematoma or bruit. Mild ecchymosis.   Labs    Chemistry Recent Labs Lab 03/25/17 0543 03/27/17 0827 03/28/17 0420  NA 139 139 139  K 4.0 3.5 3.3*  CL 98* 100* 103  CO2 28 28 27   GLUCOSE 139* 141* 151*  BUN 24* 42* 32*  CREATININE 1.39* 1.44* 1.38*  CALCIUM 9.4 8.9 8.2*  PROT 6.8  --   --   ALBUMIN 3.3*  --   --   AST 75*  --   --   ALT 33  --   --   ALKPHOS 89  --   --   BILITOT 1.0  --   --   GFRNONAA 49* 47* 49*  GFRAA 56* 54* 57*  ANIONGAP 13 11 9      Hematology Recent Labs Lab 03/25/17 0543 03/27/17 0827 03/28/17 8502  WBC 3.6* 8.6 8.4  RBC 3.98* 3.96* 3.59*  HGB 12.7* 13.1 11.5*  HCT 38.9* 38.5* 35.1*  MCV 97.7 97.2 97.8  MCH 31.9 33.1 32.0  MCHC 32.6 34.0 32.8  RDW 15.9* 15.9* 16.1*  PLT 196 238 197    Cardiac Enzymes Recent Labs Lab 03/24/17 1728 03/24/17 2359 03/25/17 0543 03/25/17 1314  TROPONINI 9.05* 7.01* 6.09* 5.50*   No results for input(s): TROPIPOC in the last 168 hours.   BNP Recent Labs Lab 03/24/17 1512  BNP 620.0*     DDimer  Recent Labs Lab 03/24/17 1801  DDIMER 0.79*     Radiology    Dg Chest 2 View  Result Date: 03/28/2017 CLINICAL DATA:  Cough and wheezing EXAM: CHEST  2 VIEW COMPARISON:  03/24/2017 FINDINGS: Cardiac shadow is stable. Left chest wall port is again seen and stable. Fullness in the left hilum is noted stable from the prior exam. Bibasilar infiltrates are seen new from the prior study. No sizable effusion is seen. No bony abnormality is noted. IMPRESSION: New bibasilar infiltrates. Electronically Signed   By: Inez Catalina M.D.   On: 03/28/2017 07:12   Ct Head Wo Contrast  Result Date: 03/28/2017 CLINICAL DATA:  Followup subdural hematoma. EXAM: CT  HEAD WITHOUT CONTRAST TECHNIQUE: Contiguous axial images were obtained from the base of the skull through the vertex without intravenous contrast. COMPARISON:  Yesterday FINDINGS: Brain: Surprising decreased density of subdural collection around the right cerebral convexity, with stable size of 9 mm maximal thickness. This may reflect CSF dilution of the hemorrhage. No midline shift. Minimal if any cortical mass effect. There is generalized atrophy with periventricular chronic microvascular ischemic change. No evidence of acute infarct. Vascular: Atherosclerotic calcification. Skull: Negative for fracture Sinuses/Orbits: Negative IMPRESSION: Size stable right cerebral convexity subdural hematoma measuring up to 9 mm thickness. Hematoma density has decreased. No midline shift. Electronically Signed   By: Monte Fantasia M.D.   On: 03/28/2017 08:04   Ct Head Wo Contrast  Result Date: 03/27/2017 CLINICAL DATA:  Fall 1 week ago.  Right-sided visual defects. EXAM: CT HEAD WITHOUT CONTRAST TECHNIQUE: Contiguous axial images were obtained from the base of the skull through the vertex without intravenous contrast. COMPARISON:  Head CT 03/24/2017 FINDINGS: Brain: There is an acute right convexity subdural hematoma measuring 9 mm. This is superimposed on the previously demonstrated chronic subdural hematoma. There is no associated mass effect or midline shift. There is periventricular hypoattenuation compatible with chronic microvascular disease. Vascular: No hyperdense vessel or unexpected calcification. Skull: Normal visualized skull base, calvarium and extracranial soft tissues. Sinuses/Orbits: No sinus fluid levels or advanced mucosal thickening. No mastoid effusion. Normal orbits. IMPRESSION: Acute subdural hematoma measuring 9 mm over the right convexity. No associated midline shift or mass effect. Critical Value/emergent results were called by telephone at the time of interpretation on 03/27/2017 at 3:50 pm to Dr.  Jonelle Sidle [facility] , who verbally acknowledged these results. Electronically Signed   By: Ulyses Jarred M.D.   On: 03/27/2017 16:06   Nm Myocar Multi W/spect W/wall Motion / Ef  Result Date: 03/26/2017  Findings consistent with ischemia and prior myocardial infarction.  This is a high risk study.  The left ventricular ejection fraction is moderately decreased (30-44%).  There was no ST segment deviation noted during stress.  Large inferior lateral scar with significant anterolateral wall ischemia EF 44% with LVE    Cardiac Studies   Echo 03/25/17: Study Conclusions  - Left ventricle: The cavity size was  normal. There was mild concentric hypertrophy. Systolic function was normal. The estimated ejection fraction was in the range of 55% to 60%. Wall motion was normal; there were no regional wall motion abnormalities. Doppler parameters are consistent with abnormal left ventricular relaxation (grade 1 diastolic dysfunction). - Aortic valve: Trileaflet; mildly thickened, moderately calcified leaflets. There was mild regurgitation. - Mitral valve: Moderately calcified annulus. There was mild regurgitation.   LHC 03/27/17:  Severe Native Vessel CAD:  Ost LAD to Mid LAD lesion, 100 %stenosed.  Ost Ramus lesion, 100 %stenosed.  Prox Cx lesion, 70 %stenosed. Ost 1st Mrg to 1st Mrg lesion, 95 %stenosed. Ost 2nd Mrg lesion, 80 %stenosed.  Ost RCA to Dist RCA lesion, 100 %stenosed. Post Atrio lesion, 99 %stenosed (filled via retrograde flow from SV-G-PDA)  Mid Cx lesion, 100 %stenosed. After small OM2.  LIMA-LAD graft was visualized by angiography and is normal in caliber and anatomically normal. Grafted LAD is relatively small with 2 small diagonals.  SVG-OM graft was not visualized by non-selective angiography (root angiogram). Origin lesion, 100 %stenosed.  SVG-RI graft was visualized by angiography and 100% occluded. Origin lesion, 100 %stenosed.  SVG-rPDA graft was  visualized by angiography and is large. Mid SVG-RCA lesion, 80 %stenosed.  Collaterals from the grafted PDA fill 2 branches of an OM as well as the ramus.  Culprit Lesion: Mid SVG-RCA lesion, 80 %stenosed  A STENT VISION RX 4.0X18 bare metal stent was successfully placed. Using distal protection device  Post intervention, there is a 5% residual stenosis.  LV end diastolic pressure is moderately elevated.      Patient Profile     Mr. 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   Assessment & Plan    # NSTEMI:  # CAD s/p CABG: # Syncope: Troponin was elevated to 9 on admission.  He underwent left heart catheterization 03/27/17 that revealed his vein graft to the ramus and OM were both 100% occluded. His LIMA to the LAD was patent but distal vessels were small. There is 80% stenosis of the vein graft to the PDA, which is also providing collaterals to the left. He underwent successful stenting with a bare metal stent with plans for clopidogrel 75 mg daily only, given his subdural hematoma.  No aspirin.  Blood pressure has been elevated. He does not have a history of hypertension. This may be due to the fact that he is currently receiving steroids.  We will start metoprolol 25 mg twice daily.  Check fasting lipid panel. He was previously on simvastatin.  We will switch this to rosuvastatin 40 mg daily.  We will still plan for a 30 day event monitor at discharge.   #Subdural hematoma:  CT scan of the head showed acute blood. The size was unchanged from prior. Plan for repeat head CT today as per Dr. Annette Stable.   # Likely pneumonia: Chest Xray today shows bilateral infiltrates. He was restarted on steroids and seems to have improved. We'll defer antibiotic treatment to primary team.    Signed, Skeet Latch, MD  03/28/2017, 9:10 AM

## 2017-03-31 ENCOUNTER — Telehealth: Payer: Self-pay | Admitting: Physician Assistant

## 2017-03-31 NOTE — Telephone Encounter (Signed)
Pt had stent placed on Monday June 4th-- would like to know if he still needs to wear event monitor

## 2017-03-31 NOTE — Telephone Encounter (Signed)
Last cardiology note in hospital: "We will still plan for a 30 day event monitor at discharge. " Thanks! Dayna Dunn PA-C

## 2017-03-31 NOTE — Telephone Encounter (Signed)
I will forward to D Dunn PA-C

## 2017-03-31 NOTE — Telephone Encounter (Signed)
Please leave message on answering machine if no one answers the phone, pts wife is not going to be home after 4

## 2017-04-01 NOTE — Telephone Encounter (Signed)
I spoke with wife and read to her note regarding plan for pt to wear monitor.She said she would put it on hi today

## 2017-04-02 ENCOUNTER — Ambulatory Visit (INDEPENDENT_AMBULATORY_CARE_PROVIDER_SITE_OTHER): Payer: PPO

## 2017-04-02 DIAGNOSIS — R55 Syncope and collapse: Secondary | ICD-10-CM

## 2017-04-03 ENCOUNTER — Encounter: Payer: Self-pay | Admitting: Cardiovascular Disease

## 2017-04-03 ENCOUNTER — Ambulatory Visit (INDEPENDENT_AMBULATORY_CARE_PROVIDER_SITE_OTHER): Payer: PPO | Admitting: Cardiovascular Disease

## 2017-04-03 VITALS — BP 121/66 | HR 49 | Ht 71.0 in | Wt 182.0 lb

## 2017-04-03 DIAGNOSIS — Z79899 Other long term (current) drug therapy: Secondary | ICD-10-CM | POA: Diagnosis not present

## 2017-04-03 DIAGNOSIS — I10 Essential (primary) hypertension: Secondary | ICD-10-CM | POA: Diagnosis not present

## 2017-04-03 DIAGNOSIS — I25708 Atherosclerosis of coronary artery bypass graft(s), unspecified, with other forms of angina pectoris: Secondary | ICD-10-CM | POA: Diagnosis not present

## 2017-04-03 DIAGNOSIS — Z955 Presence of coronary angioplasty implant and graft: Secondary | ICD-10-CM | POA: Diagnosis not present

## 2017-04-03 DIAGNOSIS — R0601 Orthopnea: Secondary | ICD-10-CM

## 2017-04-03 DIAGNOSIS — I214 Non-ST elevation (NSTEMI) myocardial infarction: Secondary | ICD-10-CM | POA: Diagnosis not present

## 2017-04-03 DIAGNOSIS — R55 Syncope and collapse: Secondary | ICD-10-CM | POA: Diagnosis not present

## 2017-04-03 MED ORDER — POTASSIUM CHLORIDE CRYS ER 20 MEQ PO TBCR: 20.0000 meq | EXTENDED_RELEASE_TABLET | ORAL | 3 refills | Status: DC

## 2017-04-03 MED ORDER — FUROSEMIDE 40 MG PO TABS: 40.0000 mg | ORAL_TABLET | ORAL | 3 refills | Status: DC

## 2017-04-03 NOTE — Progress Notes (Signed)
SUBJECTIVE: The patient presents for routine follow-up after being hospitalized last week for a subdural hematoma, non-STEMI, pneumonia, and syncope.  He also has a history of small cell lung cancer diagnosed in 2014 and treated with radiation and chemotherapy, COPD, and hypothyroidism.  He underwent coronary angiography on 03/31/17 which demonstrated the following:   Severe Native Vessel CAD:  Ost LAD to Mid LAD lesion, 100 %stenosed.  Ost Ramus lesion, 100 %stenosed.  Prox Cx lesion, 70 %stenosed. Ost 1st Mrg to 1st Mrg lesion, 95 %stenosed. Ost 2nd Mrg lesion, 80 %stenosed.  Ost RCA to Dist RCA lesion, 100 %stenosed. Post Atrio lesion, 99 %stenosed (filled via retrograde flow from SV-G-PDA)  Mid Cx lesion, 100 %stenosed. After small OM2.  LIMA-LAD graft was visualized by angiography and is normal in caliber and anatomically normal. Grafted LAD is relatively small with 2 small diagonals.  SVG-OM graft was not visualized by non-selective angiography (root angiogram). Origin lesion, 100 %stenosed.  SVG-RI graft was visualized by angiography and 100% occluded. Origin lesion, 100 %stenosed.  SVG-rPDA graft was visualized by angiography and is large. Mid SVG-RCA lesion, 80 %stenosed.  Collaterals from the grafted PDA fill 2 branches of an OM as well as the ramus.  Culprit Lesion: Mid SVG-RCA lesion, 80 %stenosed  A STENT VISION RX 4.0X18 bare metal stent was successfully placed. Using distal protection device  Post intervention, there is a 5% residual stenosis.  LV end diastolic pressure is moderately elevated.  The saphenous vein graft to the ramus and OM were both 100% occluded. There was 80% stenosis of the vein graft to the PDA. He underwent bare-metal stent placement of the SVG to PDA. Because of his subdural hematoma, he was prescribed only Plavix and not aspirin.  Echocardiogram 03/25/17: Normal left ventricular systolic function and regional wall motion, LVEF 55-60%,  grade 1 diastolic dysfunction, mild aortic and mitral regurgitation.  He denies chest pain. He has chronic exertional dyspnea which is stable. He has occasional palpitations. He denies leg swelling.  It appears he has been on Lasix 80 mg since bypass surgery.  He gets anxious at night and has to take Xanax on a daily basis.  He and his wife have a daughter at home who has chronic illnesses.   Review of Systems: As per "subjective", otherwise negative.  No Known Allergies  Current Outpatient Prescriptions  Medication Sig Dispense Refill  . ADVAIR DISKUS 100-50 MCG/DOSE AEPB Inhale 1 puff into the lungs 2 (two) times daily.  12  . albuterol (PROVENTIL HFA;VENTOLIN HFA) 108 (90 Base) MCG/ACT inhaler Inhale 1-2 puffs into the lungs every 6 (six) hours as needed for wheezing or shortness of breath.    . allopurinol (ZYLOPRIM) 300 MG tablet Take 300 mg by mouth daily.  3  . ALPRAZolam (XANAX) 0.5 MG tablet Take 1 mg by mouth at bedtime.   5  . beta carotene 25000 UNIT capsule Take by mouth.    . calcium carbonate (OSCAL) 1500 (600 Ca) MG TABS tablet Take 600 mg of elemental calcium by mouth 2 (two) times daily with a meal.    . clopidogrel (PLAVIX) 75 MG tablet Take 1 tablet (75 mg total) by mouth daily with breakfast. 30 tablet 0  . colchicine 0.6 MG tablet Take 0.6 mg by mouth 2 (two) times daily.  12  . doxycycline (VIBRA-TABS) 100 MG tablet Take 1 tablet (100 mg total) by mouth 2 (two) times daily. 14 tablet 0  . furosemide (LASIX) 80  MG tablet Take 80 mg by mouth daily.  3  . HYDROcodone-acetaminophen (NORCO/VICODIN) 5-325 MG tablet Take 1 tablet by mouth every 4 (four) hours as needed for moderate pain. 30 tablet 0  . levothyroxine (SYNTHROID, LEVOTHROID) 50 MCG tablet Take 50 mcg by mouth every morning.  3  . magic mouthwash SOLN TAKE ONE TEASPOONFUL (5ML) BY MOUTH FOUR TIMES DAILY FOR SORES IN MOUTH FROM CHEMOTHEROPY AS NEEDED  99  . metoprolol tartrate (LOPRESSOR) 25 MG tablet Take 1  tablet (25 mg total) by mouth 2 (two) times daily. 60 tablet 0  . Multiple Vitamin (MULTIVITAMIN WITH MINERALS) TABS tablet Take 1 tablet by mouth daily.    . nitroGLYCERIN (NITROSTAT) 0.4 MG SL tablet Place 1 tablet (0.4 mg total) under the tongue every 5 (five) minutes as needed for chest pain. 50 tablet 0  . potassium chloride SA (K-DUR,KLOR-CON) 20 MEQ tablet TAKE TWO (2) TABLETS BY MOUTH DAILY. (Patient taking differently: TAKE ONE TO TWO TABLETS TWO TO THREE TIMES DAILY) 60 tablet 1  . simvastatin (ZOCOR) 40 MG tablet Take 40 mg by mouth at bedtime.  3  . tiotropium (SPIRIVA) 18 MCG inhalation capsule Place 18 mcg into inhaler and inhale at bedtime.      No current facility-administered medications for this visit.     Past Medical History:  Diagnosis Date  . Anginal pain ([place]) 1997  . Anxiety   . Arthritis    "hands, knees, back" (03/24/2017)  . CAD in native artery    a. s/p CABG 1997.  Marland Kitchen CHF (congestive heart failure) ([place])   . CKD (chronic kidney disease), stage III    by labs  . COPD (chronic obstructive pulmonary disease) ([place])   . Depression   . Gout   . Hyperlipidemia   . Hypertension   . Hypothyroidism   . Lipoma    "left thigh; never treated; it's a fatty tumor" (03/24/2017)  . Memory loss   . Mild mitral regurgitation   . Myocardial infarction ([place]) 1997  . Panic attack   . Peripheral vascular disease, unspecified ([place])   . Pneumonia 02/2017  . Small cell lung cancer [facility]) 2014   diagnosed 2014 with radiation and chemotherapy  . Squamous cell carcinoma, leg    "right side; it was not basal" (03/24/2017)  . Systolic murmur     Past Surgical History:  Procedure Laterality Date  . APPENDECTOMY    . BRONCHIAL WASHINGS Left 2014   "then took biopsies"  . CARDIAC CATHETERIZATION  ?1997  . COLONOSCOPY    . COLONOSCOPY N/A 05/09/2016   Procedure: COLONOSCOPY;  Surgeon: Rogene Houston, MD;  Location: [facility];  Service: Endoscopy;  Laterality: N/A;  1030  .  CORONARY ARTERY BYPASS GRAFT  1997   "CABG X4"  . CORONARY STENT INTERVENTION N/A 03/27/2017   Procedure: Coronary Stent Intervention;  Surgeon: Leonie Man, MD;  Location: [place] [facility] LAB;  Service: Cardiovascular;  Laterality: N/A;  . HEMORRHOIDECTOMY WITH HEMORRHOID BANDING  1970s  . HERNIA REPAIR    . LEFT HEART CATH AND CORS/GRAFTS ANGIOGRAPHY N/A 03/27/2017   Procedure: Left Heart Cath and Cors/Grafts Angiography;  Surgeon: Leonie Man, MD;  Location: [place] CV LAB;  Service: Cardiovascular;  Laterality: N/A;  . SQUAMOUS CELL CARCINOMA EXCISION Right    leg  . TONSILLECTOMY    . UMBILICAL HERNIA REPAIR  1985    Social History   Social History  . Marital status: Married  Spouse name: N/A  . Number of children: N/A  . Years of education: N/A   Occupational History  . Not on file.   Social History Main Topics  . Smoking status: Former Smoker    Packs/day: 3.00    Years: 50.00    Types: Cigarettes    Quit date: 05/09/2012  . Smokeless tobacco: Never Used     Comment: 03/24/2017 "used e-cigarettes for a short time when trying to quit regular cigarettes"  . Alcohol use No  . Drug use: No  . Sexual activity: Not Currently   Other Topics Concern  . Not on file   Social History Narrative  . No narrative on file     Vitals:   04/03/17 1404  BP: 121/66  Pulse: (!) 49  Weight: 182 lb (82.6 kg)  Height: 5\' 11"  (1.803 m)    Wt Readings from Last 3 Encounters:  04/03/17 182 lb (82.6 kg)  03/28/17 184 lb 4.9 oz (83.6 kg)  03/24/17 184 lb (83.5 kg)     PHYSICAL EXAM General: NAD HEENT: Normal. Neck: No JVD, no thyromegaly. Lungs: Clear to auscultation bilaterally with normal respiratory effort. CV: Nondisplaced PMI.  Regular rate and rhythm, normal S1/S2, no S3/S4, no murmur. No pretibial or periankle edema.  No carotid bruit.   Abdomen: Soft, nontender, no distention.  Neurologic: Alert and oriented.  Psych: Normal affect. Skin:  Normal. Musculoskeletal: No gross deformities.    ECG: Most recent ECG reviewed.   Labs: Lab Results  Component Value Date/Time   K 3.3 (L) 03/28/2017 04:20 AM   BUN 32 (H) 03/28/2017 04:20 AM   CREATININE 1.38 (H) 03/28/2017 04:20 AM   ALT 33 03/25/2017 05:43 AM   HGB 11.5 (L) 03/28/2017 04:20 AM     Lipids: Lab Results  Component Value Date/Time   LDLCALC 71 03/28/2017 10:00 AM   CHOL 121 03/28/2017 10:00 AM   TRIG 98 03/28/2017 10:00 AM   HDL 30 (L) 03/28/2017 10:00 AM       ASSESSMENT AND PLAN: 1. Coronary artery disease with CABG and recent non-STEMI and IDP:[id number] stable. Plan is to continue Plavix for up to 3 months as per cath report. No aspirin is being used due to recent head bleed. Continue metoprolol and simvastatin. In mid September, Plavix will be discontinued and aspirin 81 mg will be started.  2. Hypertension: Controlled. No changes.  3. Syncope: The plan is for a 30 day event monitor as per discharge summary on 03/28/17.  4. Diuretic management: He has been on Lasix 80 mg daily since bypass surgery. He has normal left ventricular systolic function. I will switch to Lasix 40 mg every other day and reduce potassium supplementation to 20 mg once every other day. I will reassess at next visit. I told him that should he develop lower extremity edema and increasing shortness of breath in the interim, I would increase the Lasix dose.   Disposition: Follow up 3 months.  Time spent: 40 minutes, of which greater than 50% was spent reviewing symptoms, relevant blood tests and studies, and discussing management plan with the patient.   Kate Sable, M.D., F.A.C.C.

## 2017-04-03 NOTE — Patient Instructions (Addendum)
Your physician recommends that you schedule a follow-up appointment in: 3 months with Dr Bronson Ing  Take Plavix until mid-September 2018, then you may STOP. Begin aspirin 81 mg daily thereafter   DECREASE Lasix to 40 mg EVERY OTHER DAY  DECREASE Potassium to 20 meq EVERY OTHER DAY     Thank you for choosing [facility] !

## 2017-04-07 ENCOUNTER — Ambulatory Visit (HOSPITAL_COMMUNITY)
Admission: RE | Admit: 2017-04-07 | Discharge: 2017-04-07 | Disposition: A | Discharge status: Home / Self Care | Payer: PPO | Source: Ambulatory Visit | Attending: Physician Assistant | Admitting: Physician Assistant

## 2017-04-07 ENCOUNTER — Telehealth: Payer: Self-pay | Admitting: Cardiovascular Disease

## 2017-04-07 DIAGNOSIS — R55 Syncope and collapse: Secondary | ICD-10-CM | POA: Diagnosis not present

## 2017-04-07 DIAGNOSIS — I509 Heart failure, unspecified: Secondary | ICD-10-CM | POA: Diagnosis not present

## 2017-04-07 DIAGNOSIS — I110 Hypertensive heart disease with heart failure: Secondary | ICD-10-CM | POA: Insufficient documentation

## 2017-04-07 DIAGNOSIS — I2510 Atherosclerotic heart disease of native coronary artery without angina pectoris: Secondary | ICD-10-CM | POA: Insufficient documentation

## 2017-04-07 DIAGNOSIS — I080 Rheumatic disorders of both mitral and aortic valves: Secondary | ICD-10-CM | POA: Diagnosis not present

## 2017-04-07 DIAGNOSIS — C3490 Malignant neoplasm of unspecified part of unspecified bronchus or lung: Secondary | ICD-10-CM | POA: Diagnosis not present

## 2017-04-07 DIAGNOSIS — I252 Old myocardial infarction: Secondary | ICD-10-CM | POA: Insufficient documentation

## 2017-04-07 DIAGNOSIS — E785 Hyperlipidemia, unspecified: Secondary | ICD-10-CM | POA: Diagnosis not present

## 2017-04-07 DIAGNOSIS — Z951 Presence of aortocoronary bypass graft: Secondary | ICD-10-CM | POA: Insufficient documentation

## 2017-04-07 LAB — ECHOCARDIOGRAM COMPLETE
AV Mean grad: 5 mmHg
AV Peak grad: 9 mmHg
AV pk vel: 151 cm/s
AVAREAMEANV: 2.05 cm2
AVAREAMEANVIN: 1 cm2/m2
AVAREAVTI: 1.99 cm2
AVAREAVTIIND: 0.97 cm2/m2
AVCELMEANRAT: 0.65
AVLVOTPG: 4 mmHg
Ao pk vel: 0.63 m/s
CHL CUP AV PEAK INDEX: 0.97
CHL CUP AV VEL: 1.98
CHL CUP TV REG PEAK VELOCITY: 273 cm/s
DOP CAL AO MEAN VELOCITY: 100 cm/s
E/e' ratio: 18.91
EWDT: 243 ms
FS: 31 % (ref 28–44)
IVS/LV PW RATIO, ED: 1
LA ID, A-P, ES: 51 mm
LA diam end sys: 51 mm
LA diam index: 2.5 cm/m2
LA vol A4C: 78.7 ml
LA vol index: 43.9 mL/m2
LA vol: 89.7 mL
LDCA: 3.14 cm2
LV E/e' medial: 18.91
LV E/e'average: 18.91
LV PW d: 11.4 mm — AB (ref 0.6–1.1)
LV dias vol index: 45 mL/m2
LV dias vol: 91 mL (ref 62–150)
LV e' LATERAL: 7.35 cm/s
LV sys vol index: 19 mL/m2
LVOT VTI: 22.4 cm
LVOTD: 20 mm
LVOTPV: 95.8 cm/s
LVOTSV: 70 mL
LVOTVTI: 0.63 cm
LVSYSVOL: 40 mL
Lateral S' vel: 7.01 cm/s
MV Dec: 243
MV Peak grad: 8 mmHg
MV pk A vel: 32.3 m/s
MV pk E vel: 139 m/s
P 1/2 time: 713 ms
PISA EROA: 0.1 cm2
RV sys press: 33 mmHg
Simpson's disk: 57
Stroke v: 51 ml
TAPSE: 17.4 mm
TDI e' lateral: 7.35
TDI e' medial: 4.67
TRMAXVEL: 273 cm/s
VTI: 174 cm
VTI: 35.5 cm
Valve area index: 0.97
Valve area: 1.98 cm2

## 2017-04-07 NOTE — Telephone Encounter (Signed)
Please give pt's wife a call--she's very concerned w/ how Manuel Rivera is feeling and would like to speak w/ someone. [phone]

## 2017-04-07 NOTE — Telephone Encounter (Signed)
Called pt back,per HH nurse,she and spouse were out.I told her I would call back at 8 am tomorrow

## 2017-04-07 NOTE — Progress Notes (Signed)
*  PRELIMINARY RESULTS* Echocardiogram 2D Echocardiogram has been performed.  Manuel Rivera 04/07/2017, 12:21 PM

## 2017-04-08 NOTE — Telephone Encounter (Signed)
Wife worried about patients unsteady gait.I suggested she check with oncologist first.pt agrees,awaits MRI approval.

## 2017-04-09 ENCOUNTER — Other Ambulatory Visit: Payer: Self-pay | Admitting: Neurosurgery

## 2017-04-09 ENCOUNTER — Other Ambulatory Visit (HOSPITAL_COMMUNITY)
Admission: RE | Admit: 2017-04-09 | Discharge: 2017-04-09 | Disposition: A | Discharge status: Home / Self Care | Payer: PPO | Source: Ambulatory Visit | Attending: Physician Assistant | Admitting: Physician Assistant

## 2017-04-09 ENCOUNTER — Ambulatory Visit (INDEPENDENT_AMBULATORY_CARE_PROVIDER_SITE_OTHER): Payer: PPO | Admitting: Physician Assistant

## 2017-04-09 ENCOUNTER — Encounter: Payer: Self-pay | Admitting: Physician Assistant

## 2017-04-09 ENCOUNTER — Ambulatory Visit (HOSPITAL_COMMUNITY)
Admission: RE | Admit: 2017-04-09 | Discharge: 2017-04-09 | Disposition: A | Discharge status: Home / Self Care | Payer: PPO | Source: Ambulatory Visit | Attending: Physician Assistant | Admitting: Physician Assistant

## 2017-04-09 VITALS — BP 134/71 | HR 51 | Ht 71.0 in | Wt 187.0 lb

## 2017-04-09 DIAGNOSIS — Z955 Presence of coronary angioplasty implant and graft: Secondary | ICD-10-CM | POA: Diagnosis not present

## 2017-04-09 DIAGNOSIS — N183 Chronic kidney disease, stage 3 (moderate): Secondary | ICD-10-CM | POA: Diagnosis not present

## 2017-04-09 DIAGNOSIS — Z85118 Personal history of other malignant neoplasm of bronchus and lung: Secondary | ICD-10-CM

## 2017-04-09 DIAGNOSIS — Z87891 Personal history of nicotine dependence: Secondary | ICD-10-CM

## 2017-04-09 DIAGNOSIS — I5023 Acute on chronic systolic (congestive) heart failure: Secondary | ICD-10-CM

## 2017-04-09 DIAGNOSIS — I498 Other specified cardiac arrhythmias: Secondary | ICD-10-CM | POA: Diagnosis present

## 2017-04-09 DIAGNOSIS — I10 Essential (primary) hypertension: Secondary | ICD-10-CM | POA: Diagnosis not present

## 2017-04-09 DIAGNOSIS — I25810 Atherosclerosis of coronary artery bypass graft(s) without angina pectoris: Secondary | ICD-10-CM | POA: Diagnosis not present

## 2017-04-09 DIAGNOSIS — I6200 Nontraumatic subdural hemorrhage, unspecified: Secondary | ICD-10-CM | POA: Diagnosis not present

## 2017-04-09 DIAGNOSIS — R079 Chest pain, unspecified: Secondary | ICD-10-CM | POA: Diagnosis not present

## 2017-04-09 DIAGNOSIS — Z9221 Personal history of antineoplastic chemotherapy: Secondary | ICD-10-CM | POA: Diagnosis not present

## 2017-04-09 DIAGNOSIS — E876 Hypokalemia: Secondary | ICD-10-CM | POA: Diagnosis not present

## 2017-04-09 DIAGNOSIS — I447 Left bundle-branch block, unspecified: Secondary | ICD-10-CM | POA: Diagnosis not present

## 2017-04-09 DIAGNOSIS — I739 Peripheral vascular disease, unspecified: Secondary | ICD-10-CM | POA: Diagnosis present

## 2017-04-09 DIAGNOSIS — I252 Old myocardial infarction: Secondary | ICD-10-CM

## 2017-04-09 DIAGNOSIS — E039 Hypothyroidism, unspecified: Secondary | ICD-10-CM | POA: Diagnosis present

## 2017-04-09 DIAGNOSIS — E782 Mixed hyperlipidemia: Secondary | ICD-10-CM | POA: Diagnosis not present

## 2017-04-09 DIAGNOSIS — Z8249 Family history of ischemic heart disease and other diseases of the circulatory system: Secondary | ICD-10-CM | POA: Diagnosis not present

## 2017-04-09 DIAGNOSIS — I255 Ischemic cardiomyopathy: Secondary | ICD-10-CM | POA: Diagnosis present

## 2017-04-09 DIAGNOSIS — Z923 Personal history of irradiation: Secondary | ICD-10-CM

## 2017-04-09 DIAGNOSIS — R55 Syncope and collapse: Secondary | ICD-10-CM

## 2017-04-09 DIAGNOSIS — I130 Hypertensive heart and chronic kidney disease with heart failure and stage 1 through stage 4 chronic kidney disease, or unspecified chronic kidney disease: Secondary | ICD-10-CM | POA: Diagnosis not present

## 2017-04-09 DIAGNOSIS — Z7951 Long term (current) use of inhaled steroids: Secondary | ICD-10-CM | POA: Diagnosis not present

## 2017-04-09 DIAGNOSIS — R0602 Shortness of breath: Secondary | ICD-10-CM | POA: Diagnosis not present

## 2017-04-09 DIAGNOSIS — J449 Chronic obstructive pulmonary disease, unspecified: Secondary | ICD-10-CM | POA: Diagnosis not present

## 2017-04-09 DIAGNOSIS — I6203 Nontraumatic chronic subdural hemorrhage: Secondary | ICD-10-CM

## 2017-04-09 DIAGNOSIS — S065X9A Traumatic subdural hemorrhage with loss of consciousness of unspecified duration, initial encounter: Secondary | ICD-10-CM

## 2017-04-09 DIAGNOSIS — S065XAA Traumatic subdural hemorrhage with loss of consciousness status unknown, initial encounter: Secondary | ICD-10-CM

## 2017-04-09 DIAGNOSIS — I493 Ventricular premature depolarization: Secondary | ICD-10-CM | POA: Diagnosis not present

## 2017-04-09 DIAGNOSIS — T501X5A Adverse effect of loop [high-ceiling] diuretics, initial encounter: Secondary | ICD-10-CM | POA: Diagnosis not present

## 2017-04-09 DIAGNOSIS — Z7902 Long term (current) use of antithrombotics/antiplatelets: Secondary | ICD-10-CM

## 2017-04-09 LAB — BASIC METABOLIC PANEL
Anion gap: 10 (ref 5–15)
BUN: 21 mg/dL — AB (ref 6–20)
CHLORIDE: 97 mmol/L — AB (ref 101–111)
CO2: 29 mmol/L (ref 22–32)
CREATININE: 1.35 mg/dL — AB (ref 0.61–1.24)
Calcium: 9 mg/dL (ref 8.9–10.3)
GFR calc Af Amer: 59 mL/min — ABNORMAL LOW (ref >60)
GFR, EST NON AFRICAN AMERICAN: 50 mL/min — AB (ref >60)
GLUCOSE: 142 mg/dL — AB (ref 65–99)
Potassium: 3.2 mmol/L — ABNORMAL LOW (ref 3.5–5.1)
Sodium: 136 mmol/L (ref 135–145)

## 2017-04-09 LAB — BRAIN NATRIURETIC PEPTIDE: B Natriuretic Peptide: 1486 pg/mL — ABNORMAL HIGH (ref 0.0–100.0)

## 2017-04-09 MED ORDER — POTASSIUM CHLORIDE CRYS ER 20 MEQ PO TBCR: 40.0000 meq | EXTENDED_RELEASE_TABLET | ORAL | 3 refills | Status: DC

## 2017-04-09 MED ORDER — FUROSEMIDE 80 MG PO TABS: 80.0000 mg | ORAL_TABLET | Freq: Every day | ORAL | 3 refills | Status: DC

## 2017-04-09 NOTE — Patient Instructions (Signed)
Medication Instructions:  TAKE LASIX 80 MG DAILY  INCREASE POTASSIUM TO 40 MEQ DAILY   Labwork: TODAY   Testing/Procedures: A chest x-ray takes a picture of the organs and structures inside the chest, including the heart, lungs, and blood vessels. This test can show several things, including, whether the heart is enlarges; whether fluid is building up in the lungs; and whether pacemaker / defibrillator leads are still in place.   Follow-Up: Your physician recommends that you schedule a follow-up appointment in: 1 WEEK    Any Other Special Instructions Will Be Listed Below (If Applicable).     If you need a refill on your cardiac medications before your next appointment, please call your pharmacy.

## 2017-04-09 NOTE — Progress Notes (Signed)
Cardiology Office Note    Date:  04/09/2017   ID:  Davinder, Haff [birth year], MRN [id number]  PCP:  Rory Percy, MD  Cardiologist: Dr. Bronson Ing  Chief Complaint  Patient presents with  . Congestive Heart Failure    History of Present Illness:   Manuel Rivera is a 71 y.o. male who is being seen today for the evaluation of Shortness of breath at the request of Dr. Tasia Catchings.  Patient has a history of CAD status post CABG in 1997 LIMA to the LAD, SVG to RI, SVG to OM 3, SVG to PDA, small cell lung CA status post XRT and chemotherapy, CK D, PAD and HLD. He was admitted to the hospital 03/25/17 with NSTEMI troponin 9 but no chest pain. Had a syncopal episode 3 weeks prior to this while standing on a ladder developed a subdural hematoma. Lexi scan Myoview 03/26/17 with high risk LVEF reportedly 44% though it was read as 55-60% on TTE. Inferolateral scar with significant anterolateral ischemia. Case was discussed with neurology and they recommended that he not be put on DAPT if at all possible. He was okay with either aspirin or clopidogrel. Patient underwent cardiac catheterization 03/27/17 revealed SVG to the ramus and OM were both totally occluded. LIMA to the LAD was patent but distal vessels were small, 80% stenosis of the SVG to the PDA which also provided collaterals to the left. He underwent BMS to the SVG to the PDA and was discharged home on Plavix 75 mg daily. No aspirin given his subdural hematoma. Metoprolol was increased to 25 mg twice a day for hypertension. 30 day event monitor was ordered for syncope which he is currently wearing.  Saw Dr. Bronson Ing 04/03/17. Patient was doing well without angina. Plan was to continue Plavix until mid-September and then discontinue and start aspirin 81 mg daily. Lasix was decreased to 40 mg every other day and potassium was reduced to 20 mg every other day as well.  Follow-up 2-D echo 04/07/17 showed LVEF 35-40% with septal apical and inferior wall  hypokinesis mild AI moderate MR PA pressure 33 mmHg.  Patient comes in today accompanied by his wife. He complains of significant dyspnea, dyspnea on exertion and orthopnea. He's had a 5 pound weight gain since 04/03/17. He also is having balance issues. He denies chest tightness. He has soreness along his whole left side. He has never watched his salt in the past.  Past Medical History:  Diagnosis Date  . Anginal pain ([place]) 1997  . Anxiety   . Arthritis    "hands, knees, back" (03/24/2017)  . CAD in native artery    a. s/p CABG 1997.  Marland Kitchen CHF (congestive heart failure) ([place])   . CKD (chronic kidney disease), stage III    by labs  . COPD (chronic obstructive pulmonary disease) ([place])   . Depression   . Gout   . Hyperlipidemia   . Hypertension   . Hypothyroidism   . Lipoma    "left thigh; never treated; it's a fatty tumor" (03/24/2017)  . Memory loss   . Mild mitral regurgitation   . Myocardial infarction ([place]) 1997  . Panic attack   . Peripheral vascular disease, unspecified ([place])   . Pneumonia 02/2017  . Small cell lung cancer [facility]) 2014   diagnosed 2014 with radiation and chemotherapy  . Squamous cell carcinoma, leg    "right side; it was not basal" (03/24/2017)  . Systolic murmur     Past Surgical  History:  Procedure Laterality Date  . APPENDECTOMY    . BRONCHIAL WASHINGS Left 2014   "then took biopsies"  . CARDIAC CATHETERIZATION  ?1997  . COLONOSCOPY    . COLONOSCOPY N/A 05/09/2016   Procedure: COLONOSCOPY;  Surgeon: Rogene Houston, MD;  Location[facility];  Service: Endoscopy;  Laterality: N/A;  1030  . CORONARY ARTERY BYPASS GRAFT  1997   "CABG X4"  . CORONARY STENT INTERVENTION N/A 03/27/2017   Procedure: Coronary Stent Intervention;  Surgeon: Leonie Man, MD;  Location: [place] CV LAB;  Service: Cardiovascular;  Laterality: N/A;  . HEMORRHOIDECTOMY WITH HEMORRHOID BANDING  1970s  . HERNIA REPAIR    . LEFT HEART CATH AND CORS/GRAFTS ANGIOGRAPHY N/A  03/27/2017   Procedure: Left Heart Cath and Cors/Grafts Angiography;  Surgeon: Leonie Man, MD;  Location: [place] CV LAB;  Service: Cardiovascular;  Laterality: N/A;  . SQUAMOUS CELL CARCINOMA EXCISION Right    leg  . TONSILLECTOMY    . UMBILICAL HERNIA REPAIR  1985    Current Medications: Outpatient Medications Prior to Visit  Medication Sig Dispense Refill  . ADVAIR DISKUS 100-50 MCG/DOSE AEPB Inhale 1 puff into the lungs 2 (two) times daily.  12  . albuterol (PROVENTIL HFA;VENTOLIN HFA) 108 (90 Base) MCG/ACT inhaler Inhale 1-2 puffs into the lungs every 6 (six) hours as needed for wheezing or shortness of breath.    . allopurinol (ZYLOPRIM) 300 MG tablet Take 300 mg by mouth daily.  3  . ALPRAZolam (XANAX) 0.5 MG tablet Take 1.5 mg by mouth at bedtime.   5  . beta carotene 25000 UNIT capsule Take by mouth.    . calcium carbonate (OSCAL) 1500 (600 Ca) MG TABS tablet Take 600 mg of elemental calcium by mouth 2 (two) times daily with a meal.    . clopidogrel (PLAVIX) 75 MG tablet Take 1 tablet (75 mg total) by mouth daily with breakfast. 30 tablet 0  . colchicine 0.6 MG tablet Take 0.6 mg by mouth 2 (two) times daily.  12  . furosemide (LASIX) 40 MG tablet Take 1 tablet (40 mg total) by mouth every other day. 45 tablet 3  . HYDROcodone-acetaminophen (NORCO/VICODIN) 5-325 MG tablet Take 1 tablet by mouth every 4 (four) hours as needed for moderate pain. 30 tablet 0  . levothyroxine (SYNTHROID, LEVOTHROID) 50 MCG tablet Take 50 mcg by mouth every morning.  3  . magic mouthwash SOLN TAKE ONE TEASPOONFUL (5ML) BY MOUTH FOUR TIMES DAILY FOR SORES IN MOUTH FROM CHEMOTHEROPY AS NEEDED  99  . metoprolol tartrate (LOPRESSOR) 25 MG tablet Take 1 tablet (25 mg total) by mouth 2 (two) times daily. 60 tablet 0  . Multiple Vitamin (MULTIVITAMIN WITH MINERALS) TABS tablet Take 1 tablet by mouth daily.    . nitroGLYCERIN (NITROSTAT) 0.4 MG SL tablet Place 1 tablet (0.4 mg total) under the tongue  every 5 (five) minutes as needed for chest pain. 50 tablet 0  . potassium chloride SA (K-DUR,KLOR-CON) 20 MEQ tablet Take 1 tablet (20 mEq total) by mouth every other day. 45 tablet 3  . simvastatin (ZOCOR) 40 MG tablet Take 40 mg by mouth at bedtime.  3  . tiotropium (SPIRIVA) 18 MCG inhalation capsule Place 18 mcg into inhaler and inhale at bedtime.      No facility-administered medications prior to visit.      Allergies:   Patient has no known allergies.   Social History   Social History  . Marital  status: Married    Spouse name: N/A  . Number of children: N/A  . Years of education: N/A   Social History Main Topics  . Smoking status: Former Smoker    Packs/day: 3.00    Years: 50.00    Types: Cigarettes    Quit date: 05/09/2012  . Smokeless tobacco: Never Used     Comment: 03/24/2017 "used e-cigarettes for a short time when trying to quit regular cigarettes"  . Alcohol use No  . Drug use: No  . Sexual activity: Not Currently   Other Topics Concern  . None   Social History Narrative  . None     Family History:  The patient's  family history includes CAD in his mother.   ROS:   Please see the history of present illness.    Review of Systems  Constitution: Positive for weakness, malaise/fatigue and weight gain.  Cardiovascular: Positive for dyspnea on exertion.  Respiratory: Positive for cough, shortness of breath and sleep disturbances due to breathing.   Neurological: Positive for loss of balance.   All other systems reviewed and are negative.   PHYSICAL EXAM:   VS:  BP 134/71   Pulse (!) 51   Ht 5\' 11"  (1.803 m)   Wt 187 lb (84.8 kg)   SpO2 98% Comment: on room air  BMI 26.08 kg/m   Physical Exam  GEN: Elderly, in no acute distress  Neck:Increased JVD, no carotid bruits, or masses Cardiac:RRR; with some skipping, positive S4 and 2/6 systolic murmur at the left sternal border Respiratory: Decreased breath sounds with rales and rhonchi at the bases GI: soft,  nontender, nondistended, + BS Ext: Trace of lower extremity edema without cyanosis, clubbing, Good distal pulses bilaterally Neuro:  Alert and Oriented x 3 Psych: euthymic mood, full affect  Wt Readings from Last 3 Encounters:  04/09/17 187 lb (84.8 kg)  04/03/17 182 lb (82.6 kg)  03/28/17 184 lb 4.9 oz (83.6 kg)      Studies/Labs Reviewed:   EKG:  EKG is  ordered today.  The ekg ordered today demonstrates  Sinus bradycardia with poor R-wave progression anteriorly incomplete left bundle branch block nonspecific ST-T wave changes, no acute change  Recent Labs: 03/24/2017: B Natriuretic Peptide 620.0 03/25/2017: ALT 33 03/28/2017: BUN 32; Creatinine, Ser 1.38; Hemoglobin 11.5; Platelets 197; Potassium 3.3; Sodium 139   Lipid Panel    Component Value Date/Time   CHOL 121 03/28/2017 1000   TRIG 98 03/28/2017 1000   HDL 30 (L) 03/28/2017 1000   CHOLHDL 4.0 03/28/2017 1000   VLDL 20 03/28/2017 1000   LDLCALC 71 03/28/2017 1000    Additional studies/ records that were reviewed today include:  Echo 6/18/18Study Conclusions   - Left ventricle: Septal apical and inferior wall hypokinesis The   cavity size was moderately dilated. There was mild focal basal   hypertrophy of the septum. Systolic function was moderately   reduced. The estimated ejection fraction was in the range of 35%   to 40%. Left ventricular diastolic function parameters were   normal. - Aortic valve: There was mild regurgitation. Valve area (VTI):   1.98 cm^2. Valve area (Vmax): 1.99 cm^2. Valve area (Vmean): 2.05   cm^2. - Mitral valve: Calcified annulus. There was moderate   regurgitation. - Left atrium: The atrium was moderately dilated. - Right atrium: The atrium was mildly dilated. - Atrial septum: No defect or patent foramen ovale was identified. - Pulmonary arteries: PA peak pressure: 33 mm Hg (S). -  Pericardium, extracardiac: A trivial pericardial effusion was   identified posterior to the heart.     ------------------------------------------------------------------- Labs, prior tests, procedures, and    Echo 03/25/17: Study Conclusions   - Left ventricle: The cavity size was normal. There was mild   concentric hypertrophy. Systolic function was normal. The   estimated ejection fraction was in the range of 55% to 60%. Wall   motion was normal; there were no regional wall motion   abnormalities. Doppler parameters are consistent with abnormal   left ventricular relaxation (grade 1 diastolic dysfunction). - Aortic valve: Trileaflet; mildly thickened, moderately calcified   leaflets. There was mild regurgitation. - Mitral valve: Moderately calcified annulus. There was mild   regurgitation.     LHC 03/27/17:  Severe Native Vessel CAD:  Ost LAD to Mid LAD lesion, 100 %stenosed.  Ost Ramus lesion, 100 %stenosed.  Prox Cx lesion, 70 %stenosed. Ost 1st Mrg to 1st Mrg lesion, 95 %stenosed. Ost 2nd Mrg lesion, 80 %stenosed.  Ost RCA to Dist RCA lesion, 100 %stenosed. Post Atrio lesion, 99 %stenosed (filled via retrograde flow from SV-G-PDA)  Mid Cx lesion, 100 %stenosed. After small OM2.  LIMA-LAD graft was visualized by angiography and is normal in caliber and anatomically normal. Grafted LAD is relatively small with 2 small diagonals.  SVG-OM graft was not visualized by non-selective angiography (root angiogram). Origin lesion, 100 %stenosed.  SVG-RI graft was visualized by angiography and 100% occluded. Origin lesion, 100 %stenosed.  SVG-rPDA graft was visualized by angiography and is large. Mid SVG-RCA lesion, 80 %stenosed.  Collaterals from the grafted PDA fill 2 branches of an OM as well as the ramus.  Culprit Lesion: Mid SVG-RCA lesion, 80 %stenosed  A STENT VISION RX [id number] bare metal stent was successfully placed. Using distal protection device  Post intervention, there is a 5% residual stenosis.  LV end diastolic pressure is moderately elevated.   Subdural  hematoma -s/p fall which occurred approximately one month ago, fell off of a ladder -MRI brain done prior to admission, showed small chronic right convexity subdural hematoma with minimal mass effect -CT head: 9 mm thick subdural collection along the right convexity, no midline shift or mass effect. -Neurosurgery consulted recommended the following:   CT scan yesterday evening raised question of possible acute bleeding into right convexity chronic subdural hematoma. Follow-up head CT scan this morning demonstrates resolution of this questionable acute bleeding. I believe that the previous finding yesterday was artifactual secondary to staining of the subdural space with intravascular contrast given during his coronary arteriogram. Patient is very low risk for rehemorrhage. He may be mobilized ad lib.       ASSESSMENT:    1. Acute on chronic diastolic congestive heart failure ([place])   2. Atherosclerosis of coronary artery bypass graft of native heart without angina pectoris   3. Syncope, unspecified syncope type   4. Subdural hematoma ([place])   5. HTN (hypertension), benign      PLAN:  In order of problems listed above:  Acute on chronic systolic CHF with echo 9/83/38 LVEF 35-40%. Since his Lasix was decreased to 40 mg every other day last week his dyspnea has worsened. He's gained 5 pounds. Increase Lasix to 80 mg daily increase potassium to 40 mEq daily. Check chest x-ray as the wife is worried about recurrent pneumonia, BNP and BMP today. Call Friday if symptoms do not improve. Follow-up with me next week. Weigh himself daily. Call if weight gain of 2-3 pounds overnight.  CAD  status post recent MI treated with BMS and the SVG to the PDA. Plan for Plavix alone until mid-September and then switch to aspirin 81 mg once daily  Syncope resulting in subdural hematoma currently wear an event monitor on beta blocker and sinus bradycardia at 49 bpm today.  Subdural hematoma being followed  closely by neurology. He is having some balance issues.  Hypertension blood pressure stable today   Medication Adjustments/Labs and Tests Ordered: Current medicines are reviewed at length with the patient today.  Concerns regarding medicines are outlined above.  Medication changes, Labs and Tests ordered today are listed in the Patient Instructions below. There are no Patient Instructions on file for this visit.   Signed, Ermalinda Barrios, PA-C  04/09/2017 2:08 PM    [facility] [address], [place], [place]  [address] Phone: [phone]; Fax: [phone]

## 2017-04-10 ENCOUNTER — Telehealth: Payer: Self-pay | Admitting: Physician Assistant

## 2017-04-10 ENCOUNTER — Telehealth: Payer: Self-pay | Admitting: Adult Health

## 2017-04-10 ENCOUNTER — Telehealth: Payer: Self-pay | Admitting: Cardiovascular Disease

## 2017-04-10 ENCOUNTER — Telehealth: Payer: Self-pay

## 2017-04-10 DIAGNOSIS — I509 Heart failure, unspecified: Secondary | ICD-10-CM

## 2017-04-10 DIAGNOSIS — E876 Hypokalemia: Secondary | ICD-10-CM

## 2017-04-10 MED ORDER — POTASSIUM CHLORIDE CRYS ER 20 MEQ PO TBCR: 40.0000 meq | EXTENDED_RELEASE_TABLET | Freq: Every day | ORAL | 3 refills | Status: AC

## 2017-04-10 NOTE — Telephone Encounter (Signed)
Patient's son would like to discuss patient's office visit from yesterday. States that he could not get anything clear from patient's wife/ his mother. / tg

## 2017-04-10 NOTE — Telephone Encounter (Signed)
Pt's son would like for Dr Oval Linsey or Dr Ellyn Hack to give him a call. He is very concerned about what is going on with his father's condition.

## 2017-04-10 NOTE — Telephone Encounter (Signed)
Called pt's son to let him know we were in clinic and I will return his call during lunch. He appreciated the call. Will call back around 12.

## 2017-04-10 NOTE — Telephone Encounter (Signed)
-----   Message from Imogene Burn, PA-C sent at 04/10/2017  7:51 AM EDT ----- Heart failure marker elevated as suspected. Potassium low. Take Kdur 20 meq 2 tablets 3 times today then 2 tablets daily. Recheck bmet next week-I'm seeing him Wed so get it checked Tues or Wed before his appt.

## 2017-04-10 NOTE — Telephone Encounter (Signed)
His son called requesting that someone speak to him concerning his father's care as he was unable to come to most recent appointment. His son (rod) states that his mother, the patient's wife, called him very upset and emotional concerning his father's illness and recent office visit. He called for clarity as his mother is under a lot of stress caring for her husband and a disabled daughter.  I reviewed the chart prior to calling him. Once I spoke with him, I entered questions about why medication doses were adjusted to an increased dose in the setting of increased weight and worsening dyspnea. I explained the most recent echocardiogram and the results of the most recent labs. I answered several questions concerning the most recent results and reasons for increasing dose of diuretic and potassium.  The son was very reasonable and grateful, just needed clarification as his mother was upset and he needed to have accurate information to help with explaining reasons for current medical strategy. I have sent a stat message to Estella Husk, PA to let her know that I will get follow-up labs, and have them available for her next office visit with him in one week.  I explained to the patient's son that if he does not respond to diuretics, and begin to have worsening symptoms, weight gain or shortness of breath, he may be advised to come to the hospital where he can receive IV diuresis, as the by mouth medications may not be absorbing as well with fluid overload. He verbalizes understanding. I've advised him that they should continue daily weights, avoiding salt, and to please call for any further questions.  The son was very grateful, his questions were answered, and he will be at next appointment with his father on follow-up with Estella Husk, PA.

## 2017-04-10 NOTE — Telephone Encounter (Signed)
Left message for pt son to call

## 2017-04-10 NOTE — Telephone Encounter (Addendum)
Wife understands to change potassium dose to 40 meq three times today and then resume to 40 meq daily.They will repeat bmet before visit with Gerrianne Scale PA-C

## 2017-04-10 NOTE — Telephone Encounter (Signed)
Spoke with pt's wife, let her know that the pt needs to have labs done prior to visit with Ermalinda Barrios, PA. She voiced understanding. Will put labs slip up front for pt to pick up.

## 2017-04-11 ENCOUNTER — Emergency Department (HOSPITAL_COMMUNITY): Payer: PPO

## 2017-04-11 ENCOUNTER — Inpatient Hospital Stay (HOSPITAL_COMMUNITY)
Admission: EM | Admit: 2017-04-11 | Discharge: 2017-04-12 | DRG: 291 | Disposition: A | Discharge status: Home Health | Payer: PPO | Attending: Internal Medicine | Admitting: Internal Medicine

## 2017-04-11 ENCOUNTER — Encounter (HOSPITAL_COMMUNITY): Payer: Self-pay | Admitting: Emergency Medicine

## 2017-04-11 DIAGNOSIS — Z8249 Family history of ischemic heart disease and other diseases of the circulatory system: Secondary | ICD-10-CM | POA: Diagnosis not present

## 2017-04-11 DIAGNOSIS — I25118 Atherosclerotic heart disease of native coronary artery with other forms of angina pectoris: Secondary | ICD-10-CM

## 2017-04-11 DIAGNOSIS — R001 Bradycardia, unspecified: Secondary | ICD-10-CM | POA: Diagnosis not present

## 2017-04-11 DIAGNOSIS — I447 Left bundle-branch block, unspecified: Secondary | ICD-10-CM | POA: Diagnosis not present

## 2017-04-11 DIAGNOSIS — I5023 Acute on chronic systolic (congestive) heart failure: Secondary | ICD-10-CM | POA: Diagnosis not present

## 2017-04-11 DIAGNOSIS — Z955 Presence of coronary angioplasty implant and graft: Secondary | ICD-10-CM | POA: Diagnosis not present

## 2017-04-11 DIAGNOSIS — I25810 Atherosclerosis of coronary artery bypass graft(s) without angina pectoris: Secondary | ICD-10-CM | POA: Diagnosis not present

## 2017-04-11 DIAGNOSIS — I130 Hypertensive heart and chronic kidney disease with heart failure and stage 1 through stage 4 chronic kidney disease, or unspecified chronic kidney disease: Secondary | ICD-10-CM | POA: Diagnosis not present

## 2017-04-11 DIAGNOSIS — R0600 Dyspnea, unspecified: Secondary | ICD-10-CM

## 2017-04-11 DIAGNOSIS — R0602 Shortness of breath: Secondary | ICD-10-CM | POA: Diagnosis not present

## 2017-04-11 DIAGNOSIS — Z7951 Long term (current) use of inhaled steroids: Secondary | ICD-10-CM | POA: Diagnosis not present

## 2017-04-11 DIAGNOSIS — Z7902 Long term (current) use of antithrombotics/antiplatelets: Secondary | ICD-10-CM | POA: Diagnosis not present

## 2017-04-11 DIAGNOSIS — Z9221 Personal history of antineoplastic chemotherapy: Secondary | ICD-10-CM | POA: Diagnosis not present

## 2017-04-11 DIAGNOSIS — Z923 Personal history of irradiation: Secondary | ICD-10-CM | POA: Diagnosis not present

## 2017-04-11 DIAGNOSIS — E876 Hypokalemia: Secondary | ICD-10-CM | POA: Diagnosis not present

## 2017-04-11 DIAGNOSIS — Z85118 Personal history of other malignant neoplasm of bronchus and lung: Secondary | ICD-10-CM | POA: Diagnosis not present

## 2017-04-11 DIAGNOSIS — I498 Other specified cardiac arrhythmias: Secondary | ICD-10-CM | POA: Diagnosis not present

## 2017-04-11 DIAGNOSIS — J449 Chronic obstructive pulmonary disease, unspecified: Secondary | ICD-10-CM | POA: Diagnosis not present

## 2017-04-11 DIAGNOSIS — I739 Peripheral vascular disease, unspecified: Secondary | ICD-10-CM | POA: Diagnosis not present

## 2017-04-11 DIAGNOSIS — E039 Hypothyroidism, unspecified: Secondary | ICD-10-CM | POA: Diagnosis not present

## 2017-04-11 DIAGNOSIS — E782 Mixed hyperlipidemia: Secondary | ICD-10-CM | POA: Diagnosis not present

## 2017-04-11 DIAGNOSIS — I252 Old myocardial infarction: Secondary | ICD-10-CM | POA: Diagnosis not present

## 2017-04-11 DIAGNOSIS — I493 Ventricular premature depolarization: Secondary | ICD-10-CM | POA: Diagnosis not present

## 2017-04-11 DIAGNOSIS — I255 Ischemic cardiomyopathy: Secondary | ICD-10-CM | POA: Diagnosis not present

## 2017-04-11 DIAGNOSIS — Z87891 Personal history of nicotine dependence: Secondary | ICD-10-CM | POA: Diagnosis not present

## 2017-04-11 DIAGNOSIS — N183 Chronic kidney disease, stage 3 (moderate): Secondary | ICD-10-CM | POA: Diagnosis not present

## 2017-04-11 DIAGNOSIS — T501X5A Adverse effect of loop [high-ceiling] diuretics, initial encounter: Secondary | ICD-10-CM | POA: Diagnosis not present

## 2017-04-11 LAB — BASIC METABOLIC PANEL
ANION GAP: 9 (ref 5–15)
BUN: 23 mg/dL — ABNORMAL HIGH (ref 6–20)
CALCIUM: 8.9 mg/dL (ref 8.9–10.3)
CO2: 28 mmol/L (ref 22–32)
Chloride: 99 mmol/L — ABNORMAL LOW (ref 101–111)
Creatinine, Ser: 1.33 mg/dL — ABNORMAL HIGH (ref 0.61–1.24)
GFR calc Af Amer: 60 mL/min — ABNORMAL LOW (ref >60)
GFR calc non Af Amer: 51 mL/min — ABNORMAL LOW (ref >60)
GLUCOSE: 96 mg/dL (ref 65–99)
POTASSIUM: 3.8 mmol/L (ref 3.5–5.1)
SODIUM: 136 mmol/L (ref 135–145)

## 2017-04-11 LAB — CBC WITH DIFFERENTIAL/PLATELET
Basophils Absolute: 0 10*3/uL (ref 0.0–0.1)
Basophils Relative: 0 %
EOS ABS: 0.1 10*3/uL (ref 0.0–0.7)
EOS PCT: 2 %
HCT: 34.6 % — ABNORMAL LOW (ref 39.0–52.0)
Hemoglobin: 11.4 g/dL — ABNORMAL LOW (ref 13.0–17.0)
LYMPHS ABS: 0.8 10*3/uL (ref 0.7–4.0)
Lymphocytes Relative: 13 %
MCH: 32.5 pg (ref 26.0–34.0)
MCHC: 32.9 g/dL (ref 30.0–36.0)
MCV: 98.6 fL (ref 78.0–100.0)
Monocytes Absolute: 0.6 10*3/uL (ref 0.1–1.0)
Monocytes Relative: 9 %
NEUTROS PCT: 76 %
Neutro Abs: 4.8 10*3/uL (ref 1.7–7.7)
PLATELETS: 222 10*3/uL (ref 150–400)
RBC: 3.51 MIL/uL — AB (ref 4.22–5.81)
RDW: 16.1 % — ABNORMAL HIGH (ref 11.5–15.5)
WBC: 6.2 10*3/uL (ref 4.0–10.5)

## 2017-04-11 LAB — I-STAT TROPONIN, ED: Troponin i, poc: 0.02 ng/mL (ref 0.00–0.08)

## 2017-04-11 LAB — BRAIN NATRIURETIC PEPTIDE: B NATRIURETIC PEPTIDE 5: 1422 pg/mL — AB (ref 0.0–100.0)

## 2017-04-11 LAB — GLUCOSE, CAPILLARY: Glucose-Capillary: 109 mg/dL — ABNORMAL HIGH (ref 65–99)

## 2017-04-11 LAB — TSH: TSH: 1.587 u[IU]/mL (ref 0.350–4.500)

## 2017-04-11 MED ORDER — ACETAMINOPHEN 325 MG PO TABS: 650.0000 mg | ORAL_TABLET | Freq: Four times a day (QID) | ORAL | Status: DC | PRN

## 2017-04-11 MED ORDER — ONDANSETRON HCL 4 MG/2ML IJ SOLN: 4.0000 mg | Freq: Four times a day (QID) | INTRAMUSCULAR | Status: DC | PRN

## 2017-04-11 MED ORDER — TIOTROPIUM BROMIDE MONOHYDRATE 18 MCG IN CAPS
18.0000 ug | ORAL_CAPSULE | Freq: Every day | RESPIRATORY_TRACT | Status: DC
  Administered 2017-04-11: 18 ug via RESPIRATORY_TRACT
  Filled 2017-04-11: qty 5

## 2017-04-11 MED ORDER — SODIUM CHLORIDE 0.9% FLUSH
3.0000 mL | Freq: Two times a day (BID) | INTRAVENOUS | Status: DC
  Administered 2017-04-11: 3 mL via INTRAVENOUS

## 2017-04-11 MED ORDER — POTASSIUM CHLORIDE CRYS ER 20 MEQ PO TBCR
40.0000 meq | EXTENDED_RELEASE_TABLET | Freq: Every day | ORAL | Status: DC
  Administered 2017-04-12: 40 meq via ORAL
  Filled 2017-04-11 (×4): qty 2

## 2017-04-11 MED ORDER — CALCIUM CARBONATE 1500 (600 CA) MG PO TABS
600.0000 mg | ORAL_TABLET | Freq: Two times a day (BID) | ORAL | Status: DC
  Administered 2017-04-11 – 2017-04-12 (×2): 1500 mg via ORAL
  Filled 2017-04-11 (×6): qty 1

## 2017-04-11 MED ORDER — CLOPIDOGREL BISULFATE 75 MG PO TABS
75.0000 mg | ORAL_TABLET | Freq: Every day | ORAL | Status: DC
  Administered 2017-04-12: 75 mg via ORAL
  Filled 2017-04-11 (×2): qty 1

## 2017-04-11 MED ORDER — SIMVASTATIN 20 MG PO TABS
40.0000 mg | ORAL_TABLET | Freq: Every day | ORAL | Status: DC
  Administered 2017-04-11: 40 mg via ORAL
  Filled 2017-04-11: qty 2

## 2017-04-11 MED ORDER — SODIUM CHLORIDE 0.9 % IV SOLN: 250.0000 mL | INTRAVENOUS | Status: DC | PRN

## 2017-04-11 MED ORDER — ALPRAZOLAM 1 MG PO TABS
1.5000 mg | ORAL_TABLET | Freq: Every day | ORAL | Status: DC
  Administered 2017-04-11: 1.5 mg via ORAL
  Filled 2017-04-11: qty 1

## 2017-04-11 MED ORDER — ENOXAPARIN SODIUM 40 MG/0.4ML ~~LOC~~ SOLN
40.0000 mg | SUBCUTANEOUS | Status: DC
  Administered 2017-04-11: 40 mg via SUBCUTANEOUS
  Filled 2017-04-11: qty 0.4

## 2017-04-11 MED ORDER — ALBUTEROL SULFATE (2.5 MG/3ML) 0.083% IN NEBU: 2.5000 mg | INHALATION_SOLUTION | Freq: Four times a day (QID) | RESPIRATORY_TRACT | Status: DC | PRN

## 2017-04-11 MED ORDER — SODIUM CHLORIDE 0.9% FLUSH: 3.0000 mL | INTRAVENOUS | Status: DC | PRN

## 2017-04-11 MED ORDER — FUROSEMIDE 10 MG/ML IJ SOLN
60.0000 mg | Freq: Two times a day (BID) | INTRAMUSCULAR | Status: DC
  Administered 2017-04-11 – 2017-04-12 (×2): 60 mg via INTRAVENOUS
  Filled 2017-04-11 (×2): qty 6

## 2017-04-11 MED ORDER — NITROGLYCERIN 0.4 MG SL SUBL: 0.4000 mg | SUBLINGUAL_TABLET | SUBLINGUAL | Status: DC | PRN

## 2017-04-11 MED ORDER — ONDANSETRON HCL 4 MG PO TABS: 4.0000 mg | ORAL_TABLET | Freq: Four times a day (QID) | ORAL | Status: DC | PRN

## 2017-04-11 MED ORDER — MOMETASONE FURO-FORMOTEROL FUM 100-5 MCG/ACT IN AERO
2.0000 | INHALATION_SPRAY | Freq: Two times a day (BID) | RESPIRATORY_TRACT | Status: DC
  Administered 2017-04-11 – 2017-04-12 (×2): 2 via RESPIRATORY_TRACT
  Filled 2017-04-11: qty 8.8

## 2017-04-11 MED ORDER — ALBUTEROL SULFATE HFA 108 (90 BASE) MCG/ACT IN AERS: 1.0000 | INHALATION_SPRAY | Freq: Four times a day (QID) | RESPIRATORY_TRACT | Status: DC | PRN

## 2017-04-11 MED ORDER — COLCHICINE 0.6 MG PO TABS
0.6000 mg | ORAL_TABLET | Freq: Two times a day (BID) | ORAL | Status: DC
  Administered 2017-04-11 – 2017-04-12 (×2): 0.6 mg via ORAL
  Filled 2017-04-11 (×2): qty 1

## 2017-04-11 MED ORDER — ADULT MULTIVITAMIN W/MINERALS CH
1.0000 | ORAL_TABLET | Freq: Every day | ORAL | Status: DC
Start: 2017-04-11 — End: 2017-04-12
  Administered 2017-04-12: 1 via ORAL
  Filled 2017-04-11: qty 1

## 2017-04-11 MED ORDER — SODIUM CHLORIDE 0.9% FLUSH: 3.0000 mL | Freq: Two times a day (BID) | INTRAVENOUS | Status: DC

## 2017-04-11 MED ORDER — ACETAMINOPHEN 650 MG RE SUPP: 650.0000 mg | Freq: Four times a day (QID) | RECTAL | Status: DC | PRN

## 2017-04-11 MED ORDER — LEVOTHYROXINE SODIUM 50 MCG PO TABS
50.0000 ug | ORAL_TABLET | Freq: Every day | ORAL | Status: DC
  Administered 2017-04-12: 50 ug via ORAL
  Filled 2017-04-11: qty 1

## 2017-04-11 MED ORDER — ALLOPURINOL 300 MG PO TABS
300.0000 mg | ORAL_TABLET | Freq: Every day | ORAL | Status: DC
  Administered 2017-04-12: 300 mg via ORAL
  Filled 2017-04-11 (×2): qty 1

## 2017-04-11 NOTE — Telephone Encounter (Signed)
S/w pt's son he states that pt is in the hospital, at [facility]. He will call back to schedule an appt to discuss with Dr Oval Linsey

## 2017-04-11 NOTE — ED Provider Notes (Signed)
[facility] DEPT Provider Note   CSN: [id number] Arrival date & time: 04/11/17  1036     History   Chief Complaint Chief Complaint  Patient presents with  . Shortness of Breath    HPI Manuel Rivera is a 71 y.o. male.  He is here for evaluation of shortness of breath, persistent for several days.  He is taking his usual prescribed medications.  He was seen by cardiology 2 days ago and at that time his Lasix was increased to 80 mg each day, from 40 mg every other day.  He was also placed on an increased dose of potassium.  At the last cardiology visit his heart rate was 49, bradycardic.  Shortness of breath is primarily nocturnal.  He has several inhalers, but has not been using his rescue inhaler.  He has been eating well.  There has been no fever, chills, nausea, vomiting, focal weakness or paresthesia.  There are no other known modifying factors.  HPI  Past Medical History:  Diagnosis Date  . Anginal pain ([place]) 1997  . Anxiety   . Arthritis    "hands, knees, back" (03/24/2017)  . CAD in native artery    a. s/p CABG 1997.  Marland Kitchen CHF (congestive heart failure) ([place])   . CKD (chronic kidney disease), stage III    by labs  . COPD (chronic obstructive pulmonary disease) ([place])   . Depression   . Gout   . Hyperlipidemia   . Hypertension   . Hypothyroidism   . Lipoma    "left thigh; never treated; it's a fatty tumor" (03/24/2017)  . Memory loss   . Mild mitral regurgitation   . Myocardial infarction ([place]) 1997  . Panic attack   . Peripheral vascular disease, unspecified ([place])   . Pneumonia 02/2017  . Small cell lung cancer [facility]) 2014   diagnosed 2014 with radiation and chemotherapy  . Squamous cell carcinoma, leg    "right side; it was not basal" (03/24/2017)  . Systolic murmur     Patient Active Problem List   Diagnosis Date Noted  . HTN (hypertension), benign 04/09/2017  . Congestive heart failure ([place])   . NSTEMI (non-ST elevation myocardial infarction) ([place])   .  Elevated troponin   . Coronary artery disease involving native coronary artery of native heart without angina pectoris   . Syncope   . ACS (acute coronary syndrome) ([place]) 03/24/2017  . Subdural hematoma ([place]) 03/24/2017  . Small cell lung cancer ([place]) 06/19/2016  . HYPERLIPIDEMIA-MIXED 08/06/2009  . CAD, ARTERY BYPASS GRAFT 08/06/2009  . PVD 08/06/2009  . MURMUR 08/06/2009    Past Surgical History:  Procedure Laterality Date  . APPENDECTOMY    . BRONCHIAL WASHINGS Left 2014   "then took biopsies"  . CARDIAC CATHETERIZATION  ?1997  . COLONOSCOPY    . COLONOSCOPY N/A 05/09/2016   Procedure: COLONOSCOPY;  Surgeon: Rogene Houston, MD;  [facility];  Service: Endoscopy;  Laterality: N/A;  1030  . CORONARY ARTERY BYPASS GRAFT  1997   "CABG X4"  . CORONARY STENT INTERVENTION N/A 03/27/2017   Procedure: Coronary Stent Intervention;  Surgeon: Leonie Man, MD;  Location: [place] CV LAB;  Service: Cardiovascular;  Laterality: N/A;  . HEMORRHOIDECTOMY WITH HEMORRHOID BANDING  1970s  . HERNIA REPAIR    . LEFT HEART CATH AND CORS/GRAFTS ANGIOGRAPHY N/A 03/27/2017   Procedure: Left Heart Cath and Cors/Grafts Angiography;  Surgeon: Leonie Man, MD;  Location: [place] CV LAB;  Service: Cardiovascular;  Laterality: N/A;  . SQUAMOUS CELL CARCINOMA EXCISION Right    leg  . TONSILLECTOMY    . [place] Medications    Prior to Admission medications   Medication Sig Start Date End Date Taking? Authorizing Provider  ADVAIR DISKUS 100-50 MCG/DOSE AEPB Inhale 1 puff into the lungs 2 (two) times daily. 05/02/16  Yes [provider]  albuterol (PROVENTIL HFA;VENTOLIN HFA) 108 (90 Base) MCG/ACT inhaler Inhale 1-2 puffs into the lungs every 6 (six) hours as needed for wheezing or shortness of breath.   Yes [provider]  ALPRAZolam Duanne Moron) 0.5 MG tablet Take 1.5 mg by mouth at bedtime.  05/02/16  Yes [provider]    beta carotene 25000 UNIT capsule Take by mouth.   Yes [provider]  calcium carbonate (OSCAL) 1500 (600 Ca) MG TABS tablet Take 600 mg of elemental calcium by mouth 2 (two) times daily with a meal.   Yes [provider]  clopidogrel (PLAVIX) 75 MG tablet Take 1 tablet (75 mg total) by mouth daily with breakfast. 03/29/17  Yes Velvet Bathe, MD  colchicine 0.6 MG tablet Take 0.6 mg by mouth 2 (two) times daily. 04/20/16  Yes [provider]  furosemide (LASIX) 80 MG tablet Take 1 tablet (80 mg total) by mouth daily. 04/09/17 07/08/17 Yes Imogene Burn, PA-C  levothyroxine (SYNTHROID, LEVOTHROID) 50 MCG tablet Take 50 mcg by mouth every morning. 04/05/16  Yes [provider]  magic mouthwash SOLN TAKE ONE TEASPOONFUL (5ML) BY MOUTH FOUR TIMES DAILY FOR SORES IN MOUTH FROM CHEMOTHEROPY AS NEEDED 02/15/16  Yes [provider]  metoprolol tartrate (LOPRESSOR) 25 MG tablet Take 1 tablet (25 mg total) by mouth 2 (two) times daily. 03/28/17  Yes Velvet Bathe, MD  Multiple Vitamin (MULTIVITAMIN WITH MINERALS) TABS tablet Take 1 tablet by mouth daily.   Yes [provider]  nitroGLYCERIN (NITROSTAT) 0.4 MG SL tablet Place 1 tablet (0.4 mg total) under the tongue every 5 (five) minutes as needed for chest pain. 03/28/17 03/28/18 Yes Velvet Bathe, MD  potassium chloride SA (K-DUR,KLOR-CON) 20 MEQ tablet Take 2 tablets (40 mEq total) by mouth daily. 04/10/17  Yes Imogene Burn, PA-C  simvastatin (ZOCOR) 40 MG tablet Take 40 mg by mouth at bedtime. 02/06/16  Yes [provider]  tiotropium (SPIRIVA) 18 MCG inhalation capsule Place 18 mcg into inhaler and inhale at bedtime.    Yes [provider]  allopurinol (ZYLOPRIM) 300 MG tablet Take 300 mg by mouth daily. 02/29/16   [provider]  HYDROcodone-acetaminophen (NORCO/VICODIN) 5-325 MG tablet Take 1 tablet by mouth every 4 (four) hours as needed for moderate pain. Patient not taking:  Reported on 04/11/2017 12/04/16   Holley Bouche, NP    Family History Family History  Problem Relation Age of Onset  . CAD Mother        MI 62    Social History Social History  Substance Use Topics  . Smoking status: Former Smoker    Packs/day: 3.00    Years: 50.00    Types: Cigarettes    Quit date: 05/09/2012  . Smokeless tobacco: Never Used     Comment: 03/24/2017 "used e-cigarettes for a short time when trying to quit regular cigarettes"  . Alcohol use No     Allergies   Patient has no known allergies.   Review of Systems Review of Systems  All other  systems reviewed and are negative.    Physical Exam Updated Vital Signs BP 127/72   Pulse (!) 45   Temp 97.6 F (36.4 C) (Oral)   Resp 14   Ht 5\' 11"  (1.803 m)   Wt 82.1 kg (181 lb)   SpO2 98%   BMI 25.24 kg/m   Physical Exam  Constitutional: He is oriented to person, place, and time. He appears well-developed. No distress.  Elderly, frail  HENT:  Head: Normocephalic and atraumatic.  Right Ear: External ear normal.  Left Ear: External ear normal.  Eyes: Conjunctivae and EOM are normal. Pupils are equal, round, and reactive to light.  Neck: Normal range of motion and phonation normal. Neck supple.  Cardiovascular:  Bradycardic, irregular.  No JVD.  Pulmonary/Chest: Effort normal. No respiratory distress. He has no wheezes. He exhibits no bony tenderness.  Mildly decreased air movement bilaterally.  Abdominal: Soft. There is no tenderness.  Musculoskeletal: Normal range of motion. He exhibits no edema or deformity.  Neurological: He is alert and oriented to person, place, and time. No cranial nerve deficit or sensory deficit. He exhibits normal muscle tone. Coordination normal.  Skin: Skin is warm, dry and intact.  Psychiatric: He has a normal mood and affect. His behavior is normal. Judgment and thought content normal.  Nursing note and vitals reviewed.    ED Treatments / Results  Labs (all labs  ordered are listed, but only abnormal results are displayed) Labs Reviewed  BASIC METABOLIC PANEL - Abnormal; Notable for the following:       Result Value   Chloride 99 (*)    BUN 23 (*)    Creatinine, Ser 1.33 (*)    GFR calc non Af Amer 51 (*)    GFR calc Af Amer 60 (*)    All other components within normal limits  CBC WITH DIFFERENTIAL/PLATELET - Abnormal; Notable for the following:    RBC 3.51 (*)    Hemoglobin 11.4 (*)    HCT 34.6 (*)    RDW 16.1 (*)    All other components within normal limits  BRAIN NATRIURETIC PEPTIDE  TSH  I-STAT TROPOININ, ED    EKG  EKG Interpretation  Date/Time:  Friday April 11 2017 13:05:58 EDT Ventricular Rate:  44 PR Interval:    QRS Duration: 125 QT Interval:  540 QTC Calculation: 462 R Axis:   69 Text Interpretation:  Sinus bradycardia Prolonged PR interval Left bundle branch block Since last tracing Premature ventricular and fusion complexes resolved and now in sinus bradycardia Confirmed by Daleen Bo [phone]) on 04/11/2017 1:10:13 PM       Radiology Dg Chest 2 View  Result Date: 04/09/2017 CLINICAL DATA:  Chest pain EXAM: CHEST  2 VIEW COMPARISON:  03/28/2017 FINDINGS: Left chest wall port is again identified and stable. Aortic calcifications are seen. Stable fullness is noted in the left hilum consistent with prior surgery and radiation changes. Prior cardiac surgery is noted. Cardiac shadow is stable. The lungs are well aerated. The previously seen infiltrates have nearly completely resolved in the interval from the prior exam. IMPRESSION: Resolution of previously seen infiltrates. Chronic changes as described. Electronically Signed   By: Inez Catalina M.D.   On: 04/09/2017 15:05   Dg Chest Portable 1 View  Result Date: 04/11/2017 CLINICAL DATA:  Increased shortness of breath, coronary artery disease with stent placement last week, recent pneumonia, history of lung cancer post radiation therapy, COPD, coronary artery disease post MI  PTCA and CABG, CHF,  stage III chronic kidney disease EXAM: PORTABLE CHEST 1 VIEW COMPARISON:  Portable exam 1100 hours compared to 04/09/2017 FINDINGS: LEFT jugular Port-A-Cath with tip projecting over SVC. Enlargement of cardiac silhouette post CABG. Atherosclerotic calcification aorta. Pulmonary vascular congestion. Enlargement of the LEFT pulmonary hilum is again identified, more prominent on the previous study though this may be related to AP versus PA technique, corresponding to radiation fibrosis changes on a prior CT. No definite acute infiltrate, pleural effusion or pneumothorax. No acute osseous findings. IMPRESSION: Enlargement of cardiac silhouette with pulmonary vascular congestion post CABG. Enlargement of LEFT hilum and LEFT perihilar opacity corresponding to radiation fibrosis changes on prior CT. No definite acute infiltrate. Aortic Atherosclerosis (ICD10-I70.0). Electronically Signed   By: Lavonia Dana M.D.   On: 04/11/2017 11:37    Procedures Procedures (including critical care time)  Medications Ordered in ED Medications - No data to display   Initial Impression / Assessment and Plan / ED Course  I have reviewed the triage vital signs and the nursing notes.  Pertinent labs & imaging results that were available during my care of the patient were reviewed by me and considered in my medical decision making (see chart for details).      Patient Vitals for the past 24 hrs:  BP Temp Temp src Pulse Resp SpO2 Height Weight  04/11/17 1300 127/72 - - (!) 45 14 - - -  04/11/17 1256 - - - (!) 43 14 98 % - -  04/11/17 1248 - - - (!) 48 15 94 % - -  04/11/17 1245 - - - (!) 45 19 97 % - -  04/11/17 1240 - - - (!) 48 17 90 % - -  04/11/17 1235 - - - - - - - 82.1 kg (181 lb)  04/11/17 1220 - - - (!) 49 - 96 % - -  04/11/17 1215 - - - (!) 45 14 99 % - -  04/11/17 1204 126/88 - - 98 18 98 % - -  04/11/17 1200 126/88 - - - - - - -  04/11/17 1142 - - - (!) 51 14 100 % - -  04/11/17 1140 -  - - - 14 - - -  04/11/17 1100 (!) 128/56 - - - 18 - - -  04/11/17 1054 124/80 97.6 F (36.4 C) Oral 61 (!) 21 - - -  04/11/17 1047 124/80 - - - - - - -  04/11/17 1046 - - - - - - 5\' 11"  (1.803 m) 84.8 kg (187 lb)    12:48 PM Reevaluation with update and discussion. After initial assessment and treatment, an updated evaluation reveals he continues to be comfortable.  Oxygen saturation 98% on room air.  With talking his O2 sat decreases to about 94%.  Patient and family members updated on findings.Daleen Bo L   12: 50-call requested to cardiology to develop a treatment plan.  They will evaluate the patient in the emergency department.  2:00 PM-Consult complete with hospitalist. Patient case explained and discussed.  He agrees to admit patient for further evaluation and treatment. Call ended at 14: 15  Final Clinical Impressions(s) / ED Diagnoses   Final diagnoses:  Dyspnea, unspecified type  Bradycardia    Shortness of breath, nonspecific.  Patient describes being more short of breath, and nighttime, when he gets "in a panic."  Recently discharged from hospital after cardiac stenting, and at that time his metoprolol dosing was increased for treatment of "blood pressure."  He  has been taking a new increased dose of Lasix in the last few days and has good spots with 5 pound weight loss in the last 2 days.  Patient now having periods of bigeminy.  He has not had any near syncope or syncopal episodes.  No evidence for acute MI, metabolic instability or suggestion for impending vascular collapse.   Nursing Notes Reviewed/ Care Coordinated Applicable Imaging Reviewed Interpretation of Laboratory Data incorporated into ED treatment  Plan-as position as per cardiology.  13: 55-Dr. Harl Bowie, from cardiology requested the patient be admitted for observation.  He has recommended stopping metoprolol, and diuresing the patient.  He would like a hospitalist to admit the patient.  New  Prescriptions New Prescriptions   No medications on file     Daleen Bo, MD 04/11/17 1438

## 2017-04-11 NOTE — ED Triage Notes (Signed)
Pt had cardiac stents a couple weeks ago. States has been sob since. Thinks he has chf again. Pt has slight sob noted. Able to finish sentences. Very mild ble swelling. Hr 45 on monitor.

## 2017-04-11 NOTE — ED Notes (Signed)
Dr branch in with pt

## 2017-04-11 NOTE — H&P (Signed)
History and Physical    Manuel Rivera IRS:[id number] DOB: 10-14-1946 DOA: 04/11/2017  PCP: Rory Percy, MD   Patient coming from: Home    Chief Complaint: Dyspnea  HPI: Manuel Rivera is a 71 y.o. male with medical history significant of ischemic heart disease status post coronary bypass grafting, with last cardiac catheterization 03/27/2017, with bare metal stent placed on the mid SVG/RCA lesion which was 80% stenosed. Subdural hematoma due to a fall in May 2018. Patient was discharged on March 28, 2017.  Patient was placed on metoprolol for his ischemic heart disease. 7 days after his discharge, he started experiencing worsening dyspnea, moderate to severe intensity, worse with exertion to the point where he is symptomatic with minimal efforts, associated with worsening PND and orthopnea, no improving factors. Denies any angina, but positive lower extremity edema. He has been on high doses of furosemide at home, reports being compliant with medications and salt restricted diet.   ED Course: Patient was found bradycardic, cardiology was consulted recommendations to stop AV blockade and admit patient for further management.  Review of Systems:  1. General. Positive malaise, weight loss 5 pounds over last week 2. ENT no runny nose or sore throat 3. Pulmonary positive shortness of breath, no cough, no hemoptysis, positive wheezing 4. Cardiovascular, no angina or claudication, positive for PND, orthopnea lower extremity edema 5. Gastrointestinal, no nausea, vomiting or diarrhea 6. Musculoskeletal positive back pain 7. Hematology no easy bruisability or frequent infections 8. Urology no dysuria or increased her frequency 9. Neurology no seizures or paresthesias 10. Dermatology no rashes  Past Medical History:  Diagnosis Date  . Anginal pain ([place]) 1997  . Anxiety   . Arthritis    "hands, knees, back" (03/24/2017)  . CAD in native artery    a. s/p CABG 1997.  Marland Kitchen CHF (congestive heart  failure) ([place])   . CKD (chronic kidney disease), stage III    by labs  . COPD (chronic obstructive pulmonary disease) ([place])   . Depression   . Gout   . Hyperlipidemia   . Hypertension   . Hypothyroidism   . Lipoma    "left thigh; never treated; it's a fatty tumor" (03/24/2017)  . Memory loss   . Mild mitral regurgitation   . Myocardial infarction ([place]) 1997  . Panic attack   . Peripheral vascular disease, unspecified ([place])   . Pneumonia 02/2017  . Small cell lung cancer [facility]) 2014   diagnosed 2014 with radiation and chemotherapy  . Squamous cell carcinoma, leg    "right side; it was not basal" (03/24/2017)  . Systolic murmur     Past Surgical History:  Procedure Laterality Date  . APPENDECTOMY    . BRONCHIAL WASHINGS Left 2014   "then took biopsies"  . CARDIAC CATHETERIZATION  ?1997  . COLONOSCOPY    . COLONOSCOPY N/A 05/09/2016   Procedure: COLONOSCOPY;  Surgeon: Rogene Houston, MD;  [facility];  Service: Endoscopy;  Laterality: N/A;  1030  . CORONARY ARTERY BYPASS GRAFT  1997   "CABG X4"  . CORONARY STENT INTERVENTION N/A 03/27/2017   Procedure: Coronary Stent Intervention;  Surgeon: Leonie Man, MD;  Location: [place] [facility] LAB;  Service: Cardiovascular;  Laterality: N/A;  . HEMORRHOIDECTOMY WITH HEMORRHOID BANDING  1970s  . HERNIA REPAIR    . LEFT HEART CATH AND CORS/GRAFTS ANGIOGRAPHY N/A 03/27/2017   Procedure: Left Heart Cath and Cors/Grafts Angiography;  Surgeon: Leonie Man, MD;  Location: [facility]  INVASIVE CV LAB;  Service: Cardiovascular;  Laterality: N/A;  . SQUAMOUS CELL CARCINOMA EXCISION Right    leg  . TONSILLECTOMY    . [place]     reports that he quit smoking about 4 years ago. His smoking use included Cigarettes. He has a 150.00 pack-year smoking history. He has never used smokeless tobacco. He reports that he does not drink alcohol or use drugs.  No Known Allergies  Family History  Problem Relation Age of Onset    . CAD Mother        MI 30     Prior to Admission medications   Medication Sig Start Date End Date Taking? Authorizing Provider  ADVAIR DISKUS 100-50 MCG/DOSE AEPB Inhale 1 puff into the lungs 2 (two) times daily. 05/02/16  Yes [provider]  albuterol (PROVENTIL HFA;VENTOLIN HFA) 108 (90 Base) MCG/ACT inhaler Inhale 1-2 puffs into the lungs every 6 (six) hours as needed for wheezing or shortness of breath.   Yes [provider]  ALPRAZolam Duanne Moron) 0.5 MG tablet Take 1.5 mg by mouth at bedtime.  05/02/16  Yes [provider]  beta carotene 25000 UNIT capsule Take by mouth.   Yes [provider]  calcium carbonate (OSCAL) 1500 (600 Ca) MG TABS tablet Take 600 mg of elemental calcium by mouth 2 (two) times daily with a meal.   Yes [provider]  clopidogrel (PLAVIX) 75 MG tablet Take 1 tablet (75 mg total) by mouth daily with breakfast. 03/29/17  Yes Velvet Bathe, MD  colchicine 0.6 MG tablet Take 0.6 mg by mouth 2 (two) times daily. 04/20/16  Yes [provider]  furosemide (LASIX) 80 MG tablet Take 1 tablet (80 mg total) by mouth daily. 04/09/17 07/08/17 Yes Imogene Burn, PA-C  levothyroxine (SYNTHROID, LEVOTHROID) 50 MCG tablet Take 50 mcg by mouth every morning. 04/05/16  Yes [provider]  magic mouthwash SOLN TAKE ONE TEASPOONFUL (5ML) BY MOUTH FOUR TIMES DAILY FOR SORES IN MOUTH FROM CHEMOTHEROPY AS NEEDED 02/15/16  Yes [provider]  metoprolol tartrate (LOPRESSOR) 25 MG tablet Take 1 tablet (25 mg total) by mouth 2 (two) times daily. 03/28/17  Yes Velvet Bathe, MD  Multiple Vitamin (MULTIVITAMIN WITH MINERALS) TABS tablet Take 1 tablet by mouth daily.   Yes [provider]  nitroGLYCERIN (NITROSTAT) 0.4 MG SL tablet Place 1 tablet (0.4 mg total) under the tongue every 5 (five) minutes as needed for chest pain. 03/28/17 03/28/18 Yes Velvet Bathe, MD  potassium chloride SA (K-DUR,KLOR-CON) 20 MEQ tablet Take 2  tablets (40 mEq total) by mouth daily. 04/10/17  Yes Imogene Burn, PA-C  simvastatin (ZOCOR) 40 MG tablet Take 40 mg by mouth at bedtime. 02/06/16  Yes [provider]  tiotropium (SPIRIVA) 18 MCG inhalation capsule Place 18 mcg into inhaler and inhale at bedtime.    Yes [provider]  allopurinol (ZYLOPRIM) 300 MG tablet Take 300 mg by mouth daily. 02/29/16   [provider]  HYDROcodone-acetaminophen (NORCO/VICODIN) 5-325 MG tablet Take 1 tablet by mouth every 4 (four) hours as needed for moderate pain. Patient not taking: Reported on 04/11/2017 12/04/16   Holley Bouche, NP    Physical Exam: Vitals:   04/11/17 1245 04/11/17 1248 04/11/17 1256 04/11/17 1300  BP:    127/72  Pulse: (!) 45 (!) 48 (!) 43 (!) 45  Resp: 19 15 14 14   Temp:      TempSrc:  SpO2: 97% 94% 98%   Weight:      Height:        Constitutional: deconditioned and ill looking appearing Vitals:   04/11/17 1245 04/11/17 1248 04/11/17 1256 04/11/17 1300  BP:    127/72  Pulse: (!) 45 (!) 48 (!) 43 (!) 45  Resp: 19 15 14 14   Temp:      TempSrc:      SpO2: 97% 94% 98%   Weight:      Height:       Eyes: PERRL, lids and conjunctivae pale with no icterus.  Head normocephalic, nose and ears with no deformities.  ENMT: Mucous membranes are moist. Posterior pharynx clear of any exudate or lesions.Normal dentition.  Neck: normal, supple, no masses, no thyromegaly Respiratory: deceased inspiratory effort, no wheezing, positive bilateral crackles. Mild use of accessory muscles.  Cardiovascular: Regular rate and rhythm, no murmurs / rubs / gallops. No extremity edema. 2+ pedal pulses. No carotid bruits.  Abdomen: no tenderness, no masses palpated. No hepatosplenomegaly. Bowel sounds positive.  Musculoskeletal: no clubbing / cyanosis. No joint deformity upper and lower extremities. Good ROM, no contractures. Normal muscle tone.  Skin: no rashes, lesions, ulcers. No induration Neurologic: CN  2-12 grossly intact. Sensation intact, DTR normal. Strength 5/5 in all 4.     Labs on Admission: I have personally reviewed following labs and imaging studies  CBC:  Recent Labs Lab 04/11/17 1112  WBC 6.2  NEUTROABS 4.8  HGB 11.4*  HCT 34.6*  MCV 98.6  PLT 099   Basic Metabolic Panel:  Recent Labs Lab 04/09/17 1437 04/11/17 1112  NA 136 136  K 3.2* 3.8  CL 97* 99*  CO2 29 28  GLUCOSE 142* 96  BUN 21* 23*  CREATININE 1.35* 1.33*  CALCIUM 9.0 8.9   GFR: Estimated Creatinine Clearance: 52.7 mL/min (A) (by C-G formula based on SCr of 1.33 mg/dL (H)). Liver Function Tests: No results for input(s): AST, ALT, ALKPHOS, BILITOT, PROT, ALBUMIN in the last 168 hours. No results for input(s): LIPASE, AMYLASE in the last 168 hours. No results for input(s): AMMONIA in the last 168 hours. Coagulation Profile: No results for input(s): INR, PROTIME in the last 168 hours. Cardiac Enzymes: No results for input(s): CKTOTAL, CKMB, CKMBINDEX, TROPONINI in the last 168 hours. BNP (last 3 results) No results for input(s): PROBNP in the last 8760 hours. HbA1C: No results for input(s): HGBA1C in the last 72 hours. CBG: No results for input(s): GLUCAP in the last 168 hours. Lipid Profile: No results for input(s): CHOL, HDL, LDLCALC, TRIG, CHOLHDL, LDLDIRECT in the last 72 hours. Thyroid Function Tests: No results for input(s): TSH, T4TOTAL, FREET4, T3FREE, THYROIDAB in the last 72 hours. Anemia Panel: No results for input(s): VITAMINB12, FOLATE, FERRITIN, TIBC, IRON, RETICCTPCT in the last 72 hours. Urine analysis: No results found for: COLORURINE, APPEARANCEUR, LABSPEC, [place], GLUCOSEU, HGBUR, BILIRUBINUR, KETONESUR, PROTEINUR, UROBILINOGEN, NITRITE, LEUKOCYTESUR  Radiological Exams on Admission: Dg Chest 2 View  Result Date: 04/09/2017 CLINICAL DATA:  Chest pain EXAM: CHEST  2 VIEW COMPARISON:  03/28/2017 FINDINGS: Left chest wall port is again identified and stable. Aortic  calcifications are seen. Stable fullness is noted in the left hilum consistent with prior surgery and radiation changes. Prior cardiac surgery is noted. Cardiac shadow is stable. The lungs are well aerated. The previously seen infiltrates have nearly completely resolved in the interval from the prior exam. IMPRESSION: Resolution of previously seen infiltrates. Chronic changes as described. Electronically Signed   By:  Inez Catalina M.D.   On: 04/09/2017 15:05   Dg Chest Portable 1 View  Result Date: 04/11/2017 CLINICAL DATA:  Increased shortness of breath, coronary artery disease with stent placement last week, recent pneumonia, history of lung cancer post radiation therapy, COPD, coronary artery disease post MI PTCA and CABG, CHF, stage III chronic kidney disease EXAM: PORTABLE CHEST 1 VIEW COMPARISON:  Portable exam 1100 hours compared to 04/09/2017 FINDINGS: LEFT jugular Port-A-Cath with tip projecting over SVC. Enlargement of cardiac silhouette post CABG. Atherosclerotic calcification aorta. Pulmonary vascular congestion. Enlargement of the LEFT pulmonary hilum is again identified, more prominent on the previous study though this may be related to AP versus PA technique, corresponding to radiation fibrosis changes on a prior CT. No definite acute infiltrate, pleural effusion or pneumothorax. No acute osseous findings. IMPRESSION: Enlargement of cardiac silhouette with pulmonary vascular congestion post CABG. Enlargement of LEFT hilum and LEFT perihilar opacity corresponding to radiation fibrosis changes on prior CT. No definite acute infiltrate. Aortic Atherosclerosis (ICD10-I70.0). Electronically Signed   By: Lavonia Dana M.D.   On: 04/11/2017 11:37    EKG: Independently reviewed. 10:49 junctional rhythm with bigeminy, rate about 50 bpm.                                                    13:05 sinus bradycardia with a left bundle branch block  Assessment/Plan Active Problems:   Bradycardia  This is a  71 year old male with ischemic heart disease who presents with worsening dyspnea for last 7 days, associated with PND, orthopnea and lower extremity edema. He has been recently placed on AV blockade due to acute coronary syndrome, he received a bare metal stent at the saphenous vein graft to the RCA. On initial physical examination blood pressure 128/56, heart rate 43 to 51, respiratory rate 18. His oral mucosa is moist, his lungs had bibasilar rales, heart S1-S2 present, bradycardic, abdomen is soft and nontender, lower extremities trace pitting edema more left than the right. Sodium 136, potassium 3.8, chloride 99, bicarbonate 28, glucose 96, BUN 23, creatinine 1.33, white count 6.2, hemoglobin 11.4, hematocrit 34.6, platelets 122, BNP 1,486, white count 6.2, hemoglobin 11.4, hematocrit 34.6, platelets 222, chest x-ray with vascular congestion, left hilar region with changes secondary to radiation. EKG with a junctional rhythm, rate of 40-50 bpm.  Working diagnosis acute on chronic systolic heart failure, triggered by bradycardia due to AV blockade.   1. Decompensated systolic heart failure, acute on chronic. Patient will be admitted to the medical unit with a remote telemetry monitor, continue diuresis with furosemide 40 mg intravenously every 12 hours, strict in and outs and daily weights, targeting a negative fluid balance. Will hold on AV blockade. Echocardiography from June 18 with septal apical inferior wall hypokinesis, left ventricular ejection fraction 35-40%. Hold on Ace inhibitors due to risk of hypotension. Continue simvastatin and clopidogrel.   2. Junctional bradycardia/ bradyarrhythmia. Likely related to AV blockade due to metoprolol. Note that patient had a recent stent placed at the SVG to the RCA. Continue clopidogrel. Continue telemetry monitoring. Keep potassium at 4 and magnesium of 2.   3. COPD. Chronic, stable without exacerbation, continue Advair, albuterol anterior troponin per his  home regimen.    4. Hypothyroidism. Continue levothyroxine.    DVT prophylaxis: enoxaparin  Code Status: Full  Family Communication: I spoke  with patient's family at the bedside and all questions were addressed.  Disposition Plan: Home  Consults called:  Cardiology  Admission status: Inpatient    Mauricio Gerome Apley MD [facility] Pager [phone]  If 7PM-7AM, please contact night-coverage [email or web address] Password [email or web address]  04/11/2017, 2:20 PM

## 2017-04-11 NOTE — Telephone Encounter (Signed)
He should speak with the team of doctors rounding on him in the hospital.

## 2017-04-11 NOTE — Telephone Encounter (Signed)
Advised patient

## 2017-04-11 NOTE — Consult Note (Signed)
Primary cardiologist: Dr Kate Sable Consulting cardiologist: Dr Carlyle Dolly Requesting physician: Dr Eulis Foster Indication: SOB  Clinical Summary Manuel Rivera is a 71 y.o.male history of small cell lung CA dx 2014 tx with rad/chemo, recent favorable note), CAD s/p remote CABG 1997, COPD, hypothyroidism, HLD, CKD III per labs, memory loss, LE PVD (remotely followed by Dr. Scot Dock, pt denies intervention). History of syncope while working out in sun on ladder, passed out on latter and fell.   Admission 03/2017 at [facility] with elevated troponin of 9 in absence of cardiac symptoms. LHC 03/2017 showed occluded native arteries, 2/4 grafts occluded (SVG-OM occluded, SVG-RI occluded, patent LIMA-LAD, SVG-rPDA 80% stenosed. Receeived BMS to SVG-RPDA. On plavix only due to fairly recent subdrual hematoma.  03/2017 echo 35-40% Regarding his syncope there were plans for an outpatient 30 day event monitor.   During recent outpatient visit, was up 5 lbs. Lasix was increased to 80mg  daily (weight 187 lbs), weight reported as 187 and 181  Presents with significant increase in SOB and orthopnea starting yesterday. Some leg edema. Spent part of night sleeping in recliner due to orthopnea. Denies any lightheadedness or dizziness   EKG: initially junctional rhythm with bigeminy. F/u sinus brady at 44 K 3.8 Cr 1.33 BUN 23 Hgb 11.4 Plt 222 trop neg CXR + edema     No Known Allergies  Medications Scheduled Medications:    Infusions:    PRN Medications:     Past Medical History:  Diagnosis Date  . Anginal pain ([place]) 1997  . Anxiety   . Arthritis    "hands, knees, back" (03/24/2017)  . CAD in native artery    a. s/p CABG 1997.  Marland Kitchen CHF (congestive heart failure) ([place])   . CKD (chronic kidney disease), stage III    by labs  . COPD (chronic obstructive pulmonary disease) ([place])   . Depression   . Gout   . Hyperlipidemia   . Hypertension   . Hypothyroidism   . Lipoma    "left  thigh; never treated; it's a fatty tumor" (03/24/2017)  . Memory loss   . Mild mitral regurgitation   . Myocardial infarction ([place]) 1997  . Panic attack   . Peripheral vascular disease, unspecified ([place])   . Pneumonia 02/2017  . Small cell lung cancer [facility]) 2014   diagnosed 2014 with radiation and chemotherapy  . Squamous cell carcinoma, leg    "right side; it was not basal" (03/24/2017)  . Systolic murmur     Past Surgical History:  Procedure Laterality Date  . APPENDECTOMY    . BRONCHIAL WASHINGS Left 2014   "then took biopsies"  . CARDIAC CATHETERIZATION  ?1997  . COLONOSCOPY    . COLONOSCOPY N/A 05/09/2016   Procedure: COLONOSCOPY;  Surgeon: Rogene Houston, MD;  [facility];  Service: Endoscopy;  Laterality: N/A;  1030  . CORONARY ARTERY BYPASS GRAFT  1997   "CABG X4"  . CORONARY STENT INTERVENTION N/A 03/27/2017   Procedure: Coronary Stent Intervention;  Surgeon: Leonie Man, MD;  Location: [place] CV LAB;  Service: Cardiovascular;  Laterality: N/A;  . HEMORRHOIDECTOMY WITH HEMORRHOID BANDING  1970s  . HERNIA REPAIR    . LEFT HEART CATH AND CORS/GRAFTS ANGIOGRAPHY N/A 03/27/2017   Procedure: Left Heart Cath and Cors/Grafts Angiography;  Surgeon: Leonie Man, MD;  Location: [place] CV LAB;  Service: Cardiovascular;  Laterality: N/A;  . SQUAMOUS CELL CARCINOMA EXCISION Right    leg  .  TONSILLECTOMY    . UMBILICAL HERNIA REPAIR  1985    Family History  Problem Relation Age of Onset  . CAD Mother        MI 35    Social History Manuel Rivera reports that he quit smoking about 4 years ago. His smoking use included Cigarettes. He has a 150.00 pack-year smoking history. He has never used smokeless tobacco. Manuel Rivera reports that he does not drink alcohol.  Review of Systems CONSTITUTIONAL: No weight loss, fever, chills, weakness or fatigue.  HEENT: Eyes: No visual loss, blurred vision, double vision or yellow sclerae. No hearing loss, sneezing,  congestion, runny nose or sore throat.  SKIN: No rash or itching.  CARDIOVASCULAR: per hpi RESPIRATORY: per hpi  GASTROINTESTINAL: No anorexia, nausea, vomiting or diarrhea. No abdominal pain or blood.  GENITOURINARY: no polyuria, no dysuria NEUROLOGICAL: No headache, dizziness, syncope, paralysis, ataxia, numbness or tingling in the extremities. No change in bowel or bladder control.  MUSCULOSKELETAL: No muscle, back pain, joint pain or stiffness.  HEMATOLOGIC: No anemia, bleeding or bruising.  LYMPHATICS: No enlarged nodes. No history of splenectomy.  PSYCHIATRIC: No history of depression or anxiety.      Physical Examination Blood pressure 127/72, pulse (!) 45, temperature 97.6 F (36.4 C), temperature source Oral, resp. rate 14, height 5\' 11"  (1.803 m), weight 181 lb (82.1 kg), SpO2 98 %. No intake or output data in the 24 hours ending 04/11/17 1305  HEENT: sclera clear, throat clear  Cardiovascular: RRR, 2/6 systolic murmur apex  Respiratory: mild crackles bilateral bases  GI: abdomen soft, NT, ND  MSK: trace bilatearl LE edema  Neuro: no focal deficits  Psych: appropriate affect   Lab Results  Basic Metabolic Panel:  Recent Labs Lab 04/09/17 1437 04/11/17 1112  NA 136 136  K 3.2* 3.8  CL 97* 99*  CO2 29 28  GLUCOSE 142* 96  BUN 21* 23*  CREATININE 1.35* 1.33*  CALCIUM 9.0 8.9    Liver Function Tests: No results for input(s): AST, ALT, ALKPHOS, BILITOT, PROT, ALBUMIN in the last 168 hours.  CBC:  Recent Labs Lab 04/11/17 1112  WBC 6.2  NEUTROABS 4.8  HGB 11.4*  HCT 34.6*  MCV 98.6  PLT 222    Cardiac Enzymes: No results for input(s): CKTOTAL, CKMB, CKMBINDEX, TROPONINI in the last 168 hours.  BNP: Invalid input(s): POCBNP   ECG   Imaging   Impression/Recommendations 1. CAD/ICM/chronic systolic HF - LVEF 26-94% by recent echo. Recent stent to SVG-RPDA in setting of NSTEMI - on plavix alone due to recent subdural hematoma -  recent 5 lbs weight gain to 187 lbs in clinic, lasix increased to 80mg  daily.  03/2017 disharge weight 184 lbs. ER weights today reported as both 187 and 181 lbs - admitted with worsening SOB, orthopnea. CXR with pulmonary edema - admit for IV diuresis, would start with IV lasix 60mg  bid.   2. Bradycardia - prior episode of syncope, he has a 30 day event monitor at home - ekg today shows junctional rhythm with bigeminy initially, then sinus brady in mid 40s. Asymptomatic, stable bp.  - he is on lopressor 25mg  bid, this was started at 03/28/17 discharge after NSTEMI. He was not on this at time of syncope.  - d/c lopressor and monitor heart hearts [employer] heart rates will be inaccurate due to PVCs and bigeminy. Follow rates on tele overnight.     Carlyle Dolly, M.D.

## 2017-04-12 ENCOUNTER — Other Ambulatory Visit: Payer: Self-pay

## 2017-04-12 DIAGNOSIS — E876 Hypokalemia: Secondary | ICD-10-CM

## 2017-04-12 DIAGNOSIS — I498 Other specified cardiac arrhythmias: Secondary | ICD-10-CM

## 2017-04-12 DIAGNOSIS — I130 Hypertensive heart and chronic kidney disease with heart failure and stage 1 through stage 4 chronic kidney disease, or unspecified chronic kidney disease: Secondary | ICD-10-CM | POA: Diagnosis not present

## 2017-04-12 DIAGNOSIS — I25118 Atherosclerotic heart disease of native coronary artery with other forms of angina pectoris: Secondary | ICD-10-CM | POA: Diagnosis not present

## 2017-04-12 DIAGNOSIS — R001 Bradycardia, unspecified: Secondary | ICD-10-CM | POA: Diagnosis not present

## 2017-04-12 DIAGNOSIS — I5023 Acute on chronic systolic (congestive) heart failure: Secondary | ICD-10-CM | POA: Diagnosis not present

## 2017-04-12 DIAGNOSIS — R0602 Shortness of breath: Secondary | ICD-10-CM | POA: Diagnosis not present

## 2017-04-12 LAB — COMPREHENSIVE METABOLIC PANEL
ALBUMIN: 2.8 g/dL — AB (ref 3.5–5.0)
ALK PHOS: 79 U/L (ref 38–126)
ALT: 18 U/L (ref 17–63)
ANION GAP: 9 (ref 5–15)
AST: 17 U/L (ref 15–41)
BILIRUBIN TOTAL: 0.8 mg/dL (ref 0.3–1.2)
BUN: 23 mg/dL — ABNORMAL HIGH (ref 6–20)
CALCIUM: 8.6 mg/dL — AB (ref 8.9–10.3)
CO2: 30 mmol/L (ref 22–32)
CREATININE: 1.33 mg/dL — AB (ref 0.61–1.24)
Chloride: 98 mmol/L — ABNORMAL LOW (ref 101–111)
GFR calc non Af Amer: 51 mL/min — ABNORMAL LOW (ref >60)
GFR, EST AFRICAN AMERICAN: 60 mL/min — AB (ref >60)
GLUCOSE: 92 mg/dL (ref 65–99)
Potassium: 3 mmol/L — ABNORMAL LOW (ref 3.5–5.1)
Sodium: 137 mmol/L (ref 135–145)
TOTAL PROTEIN: 6.2 g/dL — AB (ref 6.5–8.1)

## 2017-04-12 LAB — CBC
HCT: 32.9 % — ABNORMAL LOW (ref 39.0–52.0)
HEMOGLOBIN: 11.1 g/dL — AB (ref 13.0–17.0)
MCH: 32.7 pg (ref 26.0–34.0)
MCHC: 33.7 g/dL (ref 30.0–36.0)
MCV: 97.1 fL (ref 78.0–100.0)
PLATELETS: 210 10*3/uL (ref 150–400)
RBC: 3.39 MIL/uL — ABNORMAL LOW (ref 4.22–5.81)
RDW: 15.8 % — ABNORMAL HIGH (ref 11.5–15.5)
WBC: 4.4 10*3/uL (ref 4.0–10.5)

## 2017-04-12 MED ORDER — HEPARIN SOD (PORK) LOCK FLUSH 100 UNIT/ML IV SOLN
500.0000 [IU] | INTRAVENOUS | Status: AC | PRN
  Administered 2017-04-12: 500 [IU]
  Filled 2017-04-12: qty 5

## 2017-04-12 MED ORDER — POTASSIUM CHLORIDE 20 MEQ PO PACK
40.0000 meq | PACK | Freq: Once | ORAL | Status: AC
  Administered 2017-04-12: 40 meq via ORAL
  Filled 2017-04-12: qty 2

## 2017-04-12 NOTE — Discharge Summary (Signed)
Physician Discharge Summary  Manuel Rivera NAT:[id number] DOB: 06/19/1946 DOA: 04/11/2017  PCP: Rory Percy, MD  Admit date: 04/11/2017 Discharge date: 04/12/2017  Admitted From: Home  Disposition:  Home   Recommendations for Outpatient Follow-up:  1. Follow up with PCP in 1- weeks 2. Metoprolol has been discontinued, avoid further av blockade  Home Health: Yes  Equipment/Devices: No   Discharge Condition: Stable CODE STATUS: Full  Diet recommendation: Heart Healthy   Brief/Interim Summary: This is a 71 year old male with ischemic heart disease who presents with worsening dyspnea for last 7 days, associated with PND, orthopnea and lower extremity edema. He has been recently placed on AV blockade due to acute coronary syndrome, he received a bare metal stent at the saphenous vein graft to the RCA. On initial physical examination blood pressure 128/56, heart rate 43 to 51, respiratory rate 18. His oral mucosa is moist, his lungs had bibasilar rales, heart S1-S2 present, bradycardic, abdomen is soft and nontender, lower extremities trace pitting edema more left than the right. Sodium 136, potassium 3.8, chloride 99, bicarbonate 28, glucose 96, BUN 23, creatinine 1.33, white count 6.2, hemoglobin 11.4, hematocrit 34.6, platelets 122, BNP 1,486, white count 6.2, hemoglobin 11.4, hematocrit 34.6, platelets 222, chest x-ray with vascular congestion, left hilar region with changes secondary to radiation. EKG with a junctional rhythm, rate of 40-50 bpm.  Working diagnosis acute on chronic systolic heart failure, triggered by bradycardia due to AV blockade.   1. Decompensated systolic heart failure, acute on chronic. Ischemic cardiomyopathy, left ventricle ejection fraction 35-40%. Patient was admitted to the medical unit with a remote telemetry monitor, he was aggressively diuresed with furosemide intravenously 60 mg twice daily, negative fluid balance was achieved with significant improvement of  his symptoms. Decompensation was attributed in part to bradycarrhythmia, recommendation to avoid further AV blockade, no ace inhibitor due to risk of hypotension.   2. Bradyarrhythmia with a junctional bradycardia due to AV blockade. Patient was admitted to the medical unit on remote telemetry, her rhythm remained sinus, rate improved up to 60 beats per minutes, blood pressure remained stable. No signs of acute coronary syndrome. Follow-up EKG, sinus rhythm, positive premature ventricular complex, rate of 50 bpm.   3. Coronary artery disease. Recent bare metal stent to the SVG/RCA, only on clopidogrel due to recent subdural hematoma. No signs of acute coronary syndrome  4. Stage 3 CKD with Hypokalemia. Related to diuresis with loop diuretics, potassium was repleted, will need close follow-up as an outpatient. Renal function stay stable with a serum creatinine 1.33. Will need close follow-up as an outpatient.   5. COPD. Chronic and stable, no signs of exacerbation, continue Advair, albuterol and tiotropium.  6. Hypothyroidism. Continue levothyroxine, TSH within normal limits.  Patient had a remarkable unexpected clinical improvement before to midnight stay, patient will be discharged home.    Discharge Diagnoses:  Active Problems:   Bradycardia    Discharge Instructions   Allergies as of 04/12/2017   No Known Allergies     Medication List    STOP taking these medications   HYDROcodone-acetaminophen 5-325 MG tablet Commonly known as:  NORCO/VICODIN   metoprolol tartrate 25 MG tablet Commonly known as:  LOPRESSOR     TAKE these medications   ADVAIR DISKUS 100-50 MCG/DOSE Aepb Generic drug:  Fluticasone-Salmeterol Inhale 1 puff into the lungs 2 (two) times daily.   albuterol 108 (90 Base) MCG/ACT inhaler Commonly known as:  PROVENTIL HFA;VENTOLIN HFA Inhale 1-2 puffs into the lungs every 6 (  six) hours as needed for wheezing or shortness of breath.   allopurinol 300 MG  tablet Commonly known as:  ZYLOPRIM Take 300 mg by mouth daily.   ALPRAZolam 0.5 MG tablet Commonly known as:  XANAX Take 1.5 mg by mouth at bedtime.   beta carotene 25000 UNIT capsule Take by mouth.   calcium carbonate 1500 (600 Ca) MG Tabs tablet Commonly known as:  OSCAL Take 600 mg of elemental calcium by mouth 2 (two) times daily with a meal.   clopidogrel 75 MG tablet Commonly known as:  PLAVIX Take 1 tablet (75 mg total) by mouth daily with breakfast.   colchicine 0.6 MG tablet Take 0.6 mg by mouth 2 (two) times daily.   furosemide 80 MG tablet Commonly known as:  LASIX Take 1 tablet (80 mg total) by mouth daily.   levothyroxine 50 MCG tablet Commonly known as:  SYNTHROID, LEVOTHROID Take 50 mcg by mouth every morning.   magic mouthwash Soln TAKE ONE TEASPOONFUL (5ML) BY MOUTH FOUR TIMES DAILY FOR SORES IN MOUTH FROM CHEMOTHEROPY AS NEEDED   multivitamin with minerals Tabs tablet Take 1 tablet by mouth daily.   nitroGLYCERIN 0.4 MG SL tablet Commonly known as:  NITROSTAT Place 1 tablet (0.4 mg total) under the tongue every 5 (five) minutes as needed for chest pain.   potassium chloride SA 20 MEQ tablet Commonly known as:  K-DUR,KLOR-CON Take 2 tablets (40 mEq total) by mouth daily.   simvastatin 40 MG tablet Commonly known as:  ZOCOR Take 40 mg by mouth at bedtime.   tiotropium 18 MCG inhalation capsule Commonly known as:  SPIRIVA Place 18 mcg into inhaler and inhale at bedtime.       No Known Allergies  Consultations:  Cardiology    Procedures/Studies: Dg Chest 2 View  Result Date: 04/09/2017 CLINICAL DATA:  Chest pain EXAM: CHEST  2 VIEW COMPARISON:  03/28/2017 FINDINGS: Left chest wall port is again identified and stable. Aortic calcifications are seen. Stable fullness is noted in the left hilum consistent with prior surgery and radiation changes. Prior cardiac surgery is noted. Cardiac shadow is stable. The lungs are well aerated. The  previously seen infiltrates have nearly completely resolved in the interval from the prior exam. IMPRESSION: Resolution of previously seen infiltrates. Chronic changes as described. Electronically Signed   By: Inez Catalina M.D.   On: 04/09/2017 15:05   Dg Chest 2 View  Result Date: 03/28/2017 CLINICAL DATA:  Cough and wheezing EXAM: CHEST  2 VIEW COMPARISON:  03/24/2017 FINDINGS: Cardiac shadow is stable. Left chest wall port is again seen and stable. Fullness in the left hilum is noted stable from the prior exam. Bibasilar infiltrates are seen new from the prior study. No sizable effusion is seen. No bony abnormality is noted. IMPRESSION: New bibasilar infiltrates. Electronically Signed   By: Inez Catalina M.D.   On: 03/28/2017 07:12   Dg Chest 2 View  Result Date: 03/24/2017 CLINICAL DATA:  History of lung cancer.  Shortness of breath. EXAM: CHEST  2 VIEW COMPARISON:  01/29/2017 FINDINGS: Injectable Port-A-Cath is in stable position. Postsurgical changes from CABG, with re- demonstrated fractured sternal wires. The cardiac silhouette is normal. There is a relatively stable radiographically left suprahilar and upper lobe spiculated density. No evidence of airspace consolidation. Osseous structures are without acute abnormality. Soft tissues are grossly normal. IMPRESSION: Relatively stable radiographically left suprahilar and left upper lobe spiculated density. This may represent known post radiation changes, however focal recurrence cannot be  excluded. Otherwise no evidence of focal airspace consolidation. Electronically Signed   By: Fidela Salisbury M.D.   On: 03/24/2017 15:39   Ct Head Wo Contrast  Result Date: 03/28/2017 CLINICAL DATA:  Followup subdural hematoma. EXAM: CT HEAD WITHOUT CONTRAST TECHNIQUE: Contiguous axial images were obtained from the base of the skull through the vertex without intravenous contrast. COMPARISON:  Yesterday FINDINGS: Brain: Surprising decreased density of subdural  collection around the right cerebral convexity, with stable size of 9 mm maximal thickness. This may reflect CSF dilution of the hemorrhage. No midline shift. Minimal if any cortical mass effect. There is generalized atrophy with periventricular chronic microvascular ischemic change. No evidence of acute infarct. Vascular: Atherosclerotic calcification. Skull: Negative for fracture Sinuses/Orbits: Negative IMPRESSION: Size stable right cerebral convexity subdural hematoma measuring up to 9 mm thickness. Hematoma density has decreased. No midline shift. Electronically Signed   By: Monte Fantasia M.D.   On: 03/28/2017 08:04   Ct Head Wo Contrast  Result Date: 03/27/2017 CLINICAL DATA:  Fall 1 week ago.  Right-sided visual defects. EXAM: CT HEAD WITHOUT CONTRAST TECHNIQUE: Contiguous axial images were obtained from the base of the skull through the vertex without intravenous contrast. COMPARISON:  Head CT 03/24/2017 FINDINGS: Brain: There is an acute right convexity subdural hematoma measuring 9 mm. This is superimposed on the previously demonstrated chronic subdural hematoma. There is no associated mass effect or midline shift. There is periventricular hypoattenuation compatible with chronic microvascular disease. Vascular: No hyperdense vessel or unexpected calcification. Skull: Normal visualized skull base, calvarium and extracranial soft tissues. Sinuses/Orbits: No sinus fluid levels or advanced mucosal thickening. No mastoid effusion. Normal orbits. IMPRESSION: Acute subdural hematoma measuring 9 mm over the right convexity. No associated midline shift or mass effect. Critical Value/emergent results were called by telephone at the time of interpretation on 03/27/2017 at 3:50 pm to Dr. Jonelle Sidle [facility] , who verbally acknowledged these results. Electronically Signed   By: Ulyses Jarred M.D.   On: 03/27/2017 16:06   Ct Head Wo Contrast  Result Date: 03/24/2017 CLINICAL DATA:  Syncopal episode EXAM: CT HEAD  WITHOUT CONTRAST TECHNIQUE: Contiguous axial images were obtained from the base of the skull through the vertex without intravenous contrast. COMPARISON:  MRI 03/19/2017 FINDINGS: Brain: No acute territorial infarction or intracranial mass is seen. Slightly complex right subdural collection, measuring 9 mm maximum thickness on coronal views and probably not significantly changed compared with MRI 03/19/2017. No midline shift. Mild periventricular white matter small vessel ischemic changes. Atrophy. Stable ventricle size. Vascular: No hyperdense vessels. Carotid artery calcifications. Vertebral artery calcifications. Skull: No fracture or suspicious bone lesion. Sinuses/Orbits: No acute finding. Other: None IMPRESSION: 1. 9 mm thick subdural collection along the right convexity. No midline shift or significant mass effect. 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 No definite acute hemorrhage is seen at this time. 2. Mild white matter small vessel ischemic changes. Electronically Signed   By: Donavan Foil M.D.   On: 03/24/2017 19:03   Nm Myocar Multi W/spect W/wall Motion / Ef  Result Date: 03/26/2017  Findings consistent with ischemia and prior myocardial infarction.  This is a high risk study.  The left ventricular ejection fraction is moderately decreased (30-44%).  There was no ST segment deviation noted during stress.  Large inferior lateral scar with significant anterolateral wall ischemia EF 44% with LVE   Dg Chest Portable 1 View  Result Date:  04/11/2017 CLINICAL DATA:  Increased shortness of breath, coronary artery disease with stent placement last week, recent pneumonia, history of lung cancer post radiation therapy, COPD, coronary artery disease post MI PTCA and CABG, CHF, stage III chronic kidney disease EXAM: PORTABLE CHEST 1 VIEW COMPARISON:  Portable exam 1100 hours compared to  04/09/2017 FINDINGS: LEFT jugular Port-A-Cath with tip projecting over SVC. Enlargement of cardiac silhouette post CABG. Atherosclerotic calcification aorta. Pulmonary vascular congestion. Enlargement of the LEFT pulmonary hilum is again identified, more prominent on the previous study though this may be related to AP versus PA technique, corresponding to radiation fibrosis changes on a prior CT. No definite acute infiltrate, pleural effusion or pneumothorax. No acute osseous findings. IMPRESSION: Enlargement of cardiac silhouette with pulmonary vascular congestion post CABG. Enlargement of LEFT hilum and LEFT perihilar opacity corresponding to radiation fibrosis changes on prior CT. No definite acute infiltrate. Aortic Atherosclerosis (ICD10-I70.0). Electronically Signed   By: Lavonia Dana M.D.   On: 04/11/2017 11:37     Subjective: Patient feeling better, dyspnea improved, patient has been able to ambulate. No chest pain, improved lower extremity edema.   Discharge Exam: Vitals:   04/11/17 2117 04/12/17 0638  BP: 133/70 (!) 106/43  Pulse: (!) 54 (!) 53  Resp: 20 20  Temp: 98.4 F (36.9 C) 98 F (36.7 C)   Vitals:   04/11/17 2218 04/12/17 1443 04/12/17 0808 04/12/17 1012  BP:  (!) 106/43    Pulse:  (!) 53    Resp:  20    Temp:  98 F (36.7 C)    TempSrc:  Oral    SpO2: 93% (!) 88% 94%   Weight:    80.1 kg (176 lb 9.6 oz)  Height:        General: Pt is alert, awake, not in acute distress Neck with no JVD Cardiovascular: RRR, S1/S2 +, no rubs, no gallops Respiratory: CTA bilaterally, no wheezing, no rhonchi Abdominal: Soft, NT, ND, bowel sounds + Extremities: no edema, no cyanosis    The results of significant diagnostics from this hospitalization (including imaging, microbiology, ancillary and laboratory) are listed below for reference.     Microbiology: No results found for this or any previous visit (from the past 240 hour(s)).   Labs: BNP (last 3 results)  Recent  Labs  03/24/17 1512 04/09/17 1437 04/11/17 1327  BNP 620.0* 1,486.0* 1,540.0*   Basic Metabolic Panel:  Recent Labs Lab 04/09/17 1437 04/11/17 1112 04/12/17 0518  NA 136 136 137  K 3.2* 3.8 3.0*  CL 97* 99* 98*  CO2 29 28 30   GLUCOSE 142* 96 92  BUN 21* 23* 23*  CREATININE 1.35* 1.33* 1.33*  CALCIUM 9.0 8.9 8.6*   Liver Function Tests:  Recent Labs Lab 04/12/17 0518  AST 17  ALT 18  ALKPHOS 79  BILITOT 0.8  PROT 6.2*  ALBUMIN 2.8*   No results for input(s): LIPASE, AMYLASE in the last 168 hours. No results for input(s): AMMONIA in the last 168 hours. CBC:  Recent Labs Lab 04/11/17 1112 04/12/17 0518  WBC 6.2 4.4  NEUTROABS 4.8  --   HGB 11.4* 11.1*  HCT 34.6* 32.9*  MCV 98.6 97.1  PLT 222 210   Cardiac Enzymes: No results for input(s): CKTOTAL, CKMB, CKMBINDEX, TROPONINI in the last 168 hours. BNP: Invalid input(s): POCBNP CBG:  Recent Labs Lab 04/11/17 2043  GLUCAP 109*   D-Dimer No results for input(s): DDIMER in the last 72 hours. Hgb A1c No results for input(s):  HGBA1C in the last 72 hours. Lipid Profile No results for input(s): CHOL, HDL, LDLCALC, TRIG, CHOLHDL, LDLDIRECT in the last 72 hours. Thyroid function studies  Recent Labs  04/11/17 1327  TSH 1.587   Anemia work up No results for input(s): VITAMINB12, FOLATE, FERRITIN, TIBC, IRON, RETICCTPCT in the last 72 hours. Urinalysis No results found for: COLORURINE, APPEARANCEUR, LABSPEC, [place], GLUCOSEU, HGBUR, BILIRUBINUR, KETONESUR, PROTEINUR, UROBILINOGEN, NITRITE, LEUKOCYTESUR Sepsis Labs Invalid input(s): PROCALCITONIN,  WBC,  LACTICIDVEN Microbiology No results found for this or any previous visit (from the past 240 hour(s)).   Time coordinating discharge: 45 minutes  SIGNED:   Tawni Millers, MD  [facility] 04/12/2017, 10:16 AM Pager   If 7PM-7AM, please contact night-coverage [email or web address] Password [email or web address]

## 2017-04-16 ENCOUNTER — Encounter: Payer: Self-pay | Admitting: Physician Assistant

## 2017-04-16 ENCOUNTER — Ambulatory Visit (INDEPENDENT_AMBULATORY_CARE_PROVIDER_SITE_OTHER): Payer: PPO | Admitting: Physician Assistant

## 2017-04-16 ENCOUNTER — Other Ambulatory Visit (HOSPITAL_COMMUNITY)
Admission: RE | Admit: 2017-04-16 | Discharge: 2017-04-16 | Disposition: A | Discharge status: Home / Self Care | Payer: PPO | Source: Ambulatory Visit | Attending: Physician Assistant | Admitting: Physician Assistant

## 2017-04-16 VITALS — BP 146/76 | HR 74 | Ht 71.0 in | Wt 175.0 lb

## 2017-04-16 DIAGNOSIS — I5022 Chronic systolic (congestive) heart failure: Secondary | ICD-10-CM | POA: Insufficient documentation

## 2017-04-16 DIAGNOSIS — I6200 Nontraumatic subdural hemorrhage, unspecified: Secondary | ICD-10-CM

## 2017-04-16 DIAGNOSIS — R55 Syncope and collapse: Secondary | ICD-10-CM | POA: Diagnosis not present

## 2017-04-16 DIAGNOSIS — I25810 Atherosclerosis of coronary artery bypass graft(s) without angina pectoris: Secondary | ICD-10-CM

## 2017-04-16 DIAGNOSIS — I10 Essential (primary) hypertension: Secondary | ICD-10-CM

## 2017-04-16 DIAGNOSIS — R001 Bradycardia, unspecified: Secondary | ICD-10-CM

## 2017-04-16 DIAGNOSIS — S065X9A Traumatic subdural hemorrhage with loss of consciousness of unspecified duration, initial encounter: Secondary | ICD-10-CM

## 2017-04-16 DIAGNOSIS — S065XAA Traumatic subdural hemorrhage with loss of consciousness status unknown, initial encounter: Secondary | ICD-10-CM

## 2017-04-16 LAB — BASIC METABOLIC PANEL
ANION GAP: 11 (ref 5–15)
BUN: 17 mg/dL (ref 6–20)
CHLORIDE: 97 mmol/L — AB (ref 101–111)
CO2: 29 mmol/L (ref 22–32)
CREATININE: 1.33 mg/dL — AB (ref 0.61–1.24)
Calcium: 9.4 mg/dL (ref 8.9–10.3)
GFR calc non Af Amer: 51 mL/min — ABNORMAL LOW (ref >60)
GFR, EST AFRICAN AMERICAN: 60 mL/min — AB (ref >60)
Glucose, Bld: 91 mg/dL (ref 65–99)
Potassium: 3.6 mmol/L (ref 3.5–5.1)
SODIUM: 137 mmol/L (ref 135–145)

## 2017-04-16 NOTE — Progress Notes (Signed)
Cardiology Office Note    Date:  04/16/2017   ID:  Faaris, [place] June 12, 1946, MRN [id number]  PCP:  Rory Percy, MD  Cardiologist: Dr.Koneswaran  Chief Complaint  Patient presents with  . Follow-up    History of Present Illness:  Manuel Rivera is a 71 y.o. male a history of CAD status post CABG in 1997 LIMA to the LAD, SVG to RI, SVG to OM 3, SVG to PDA, small cell lung CA status post XRT and chemotherapy, CK D, PAD and HLD. He was admitted to the hospital 03/25/17 with NSTEMI troponin 9 but no chest pain. Had a syncopal episode 3 weeks prior to this while standing on a ladder developed a subdural hematoma. Lexi scan Myoview 03/26/17 with high risk LVEF reportedly 44% though it was read as 55-60% on TTE. Inferolateral scar with significant anterolateral ischemia. Case was discussed with neurology and they recommended that he not be put on DAPT if at all possible. He was okay with either aspirin or clopidogrel. Patient underwent cardiac catheterization 03/27/17 revealed SVG to the ramus and OM were both totally occluded. LIMA to the LAD was patent but distal vessels were small, 80% stenosis of the SVG to the PDA which also provided collaterals to the left. He underwent BMS to the SVG to the PDA and was discharged home on Plavix 75 mg daily. No aspirin given his subdural hematoma. Metoprolol was increased to 25 mg twice a day for hypertension. 30 day event monitor was ordered for syncope which he is currently wearing.   Saw Dr. Bronson Ing 04/03/17. Patient was doing well without angina. Plan was to continue Plavix until mid-September and then discontinue and start aspirin 81 mg daily. Lasix was decreased to 40 mg every other day and potassium was reduced to 20 mg every other day as well.   Follow-up 2-D echo 04/07/17 showed LVEF 35-40% with septal apical and inferior wall hypokinesis mild AI moderate MR PA pressure 33 mmHg.   I saw the patient 04/09/17 with significant dyspnea, dyspnea on  exertion and orthopnea. He had a 5 pound weight gain and was having balance issues since his Lasix was decreased. I increase Lasix to 80 mg daily and potassium to 40 mEq daily. BNP was 1486 potassium is 3.2 creatinine 1.35, chest x-ray without pneumonia. He was to call Friday if no improvement.  Patient continued to have dyspnea and was admitted to the hospital overnight 04/11/17. Chest x-ray with pulmonary edema. EKG showed junctional rhythm with bigeminy initially then sinus bradycardia in the mid 40s. He was asymptomatic. Lopressor was stopped. Diuresed with Lasix 60 mg IV BID. Weight 176 lbs at discharge.    Past Medical History:  Diagnosis Date  . Anginal pain ([place]) 1997  . Anxiety   . Arthritis    "hands, knees, back" (03/24/2017)  . CAD in native artery    a. s/p CABG 1997.  Marland Kitchen CHF (congestive heart failure) ([place])   . CKD (chronic kidney disease), stage III    by labs  . COPD (chronic obstructive pulmonary disease) ([place])   . Depression   . Gout   . Hyperlipidemia   . Hypertension   . Hypothyroidism   . Lipoma    "left thigh; never treated; it's a fatty tumor" (03/24/2017)  . Memory loss   . Mild mitral regurgitation   . Myocardial infarction ([place]) 1997  . Panic attack   . Peripheral vascular disease, unspecified ([place])   . Pneumonia 02/2017  .  Small cell lung cancer [facility]) 2014   diagnosed 2014 with radiation and chemotherapy  . Squamous cell carcinoma, leg    "right side; it was not basal" (03/24/2017)  . Systolic murmur     Past Surgical History:  Procedure Laterality Date  . APPENDECTOMY    . BRONCHIAL WASHINGS Left 2014   "then took biopsies"  . CARDIAC CATHETERIZATION  ?1997  . COLONOSCOPY    . COLONOSCOPY N/A 05/09/2016   Procedure: COLONOSCOPY;  Surgeon: Rogene Houston, MD;  Location[facility];  Service: Endoscopy;  Laterality: N/A;  1030  . CORONARY ARTERY BYPASS GRAFT  1997   "CABG X4"  . CORONARY STENT INTERVENTION N/A 03/27/2017   Procedure: Coronary Stent  Intervention;  Surgeon: Leonie Man, MD;  Location: [place] CV LAB;  Service: Cardiovascular;  Laterality: N/A;  . HEMORRHOIDECTOMY WITH HEMORRHOID BANDING  1970s  . HERNIA REPAIR    . LEFT HEART CATH AND CORS/GRAFTS ANGIOGRAPHY N/A 03/27/2017   Procedure: Left Heart Cath and Cors/Grafts Angiography;  Surgeon: Leonie Man, MD;  Location: [place] CV LAB;  Service: Cardiovascular;  Laterality: N/A;  . SQUAMOUS CELL CARCINOMA EXCISION Right    leg  . TONSILLECTOMY    . UMBILICAL HERNIA REPAIR  1985    Current Medications: Current Meds  Medication Sig  . ADVAIR DISKUS 250-50 MCG/DOSE AEPB Inhale 1 puff into the lungs 2 (two) times daily.  Marland Kitchen albuterol (PROVENTIL HFA;VENTOLIN HFA) 108 (90 Base) MCG/ACT inhaler Inhale 1-2 puffs into the lungs every 6 (six) hours as needed for wheezing or shortness of breath.  . allopurinol (ZYLOPRIM) 300 MG tablet Take 300 mg by mouth daily.  Marland Kitchen ALPRAZolam (XANAX) 0.5 MG tablet Take 1.5 mg by mouth at bedtime.   . beta carotene 25000 UNIT capsule Take by mouth.  . calcium carbonate (OSCAL) 1500 (600 Ca) MG TABS tablet Take 600 mg of elemental calcium by mouth 2 (two) times daily with a meal.  . clopidogrel (PLAVIX) 75 MG tablet Take 1 tablet (75 mg total) by mouth daily with breakfast.  . colchicine 0.6 MG tablet Take 0.6 mg by mouth 2 (two) times daily.  . furosemide (LASIX) 80 MG tablet Take 1 tablet (80 mg total) by mouth daily.  Marland Kitchen levothyroxine (SYNTHROID, LEVOTHROID) 50 MCG tablet Take 50 mcg by mouth every morning.  . magic mouthwash SOLN TAKE ONE TEASPOONFUL (5ML) BY MOUTH FOUR TIMES DAILY FOR SORES IN MOUTH FROM CHEMOTHEROPY AS NEEDED  . Multiple Vitamin (MULTIVITAMIN WITH MINERALS) TABS tablet Take 1 tablet by mouth daily.  . nitroGLYCERIN (NITROSTAT) 0.4 MG SL tablet Place 1 tablet (0.4 mg total) under the tongue every 5 (five) minutes as needed for chest pain.  . potassium chloride SA (K-DUR,KLOR-CON) 20 MEQ tablet Take 2 tablets (40 mEq  total) by mouth daily.  . simvastatin (ZOCOR) 40 MG tablet Take 40 mg by mouth at bedtime.  Marland Kitchen tiotropium (SPIRIVA) 18 MCG inhalation capsule Place 18 mcg into inhaler and inhale at bedtime.      Allergies:   Patient has no known allergies.   Social History   Social History  . Marital status: Married    Spouse name: N/A  . Number of children: N/A  . Years of education: N/A   Social History Main Topics  . Smoking status: Former Smoker    Packs/day: 3.00    Years: 50.00    Types: Cigarettes    Quit date: 05/09/2012  . Smokeless tobacco: Never Used  Comment: 03/24/2017 "used e-cigarettes for a short time when trying to quit regular cigarettes"  . Alcohol use No  . Drug use: No  . Sexual activity: Not Currently   Other Topics Concern  . None   Social History Narrative  . None     Family History:  The patient's   family history includes CAD in his mother.   ROS:   Please see the history of present illness.    Review of Systems  Constitution: Positive for malaise/fatigue and weight loss.  HENT: Negative.   Cardiovascular: Positive for dyspnea on exertion.  Respiratory: Positive for cough and wheezing.   Endocrine: Negative.   Hematologic/Lymphatic: Negative.   Musculoskeletal: Positive for back pain.  Gastrointestinal: Negative.   Genitourinary: Negative.   Neurological: Positive for loss of balance.  Psychiatric/Behavioral: The patient is nervous/anxious.    All other systems reviewed and are negative.   PHYSICAL EXAM:   VS:  BP (!) 146/76   Pulse 74   Ht 5' 11"  (1.803 m)   Wt 175 lb (79.4 kg)   SpO2 98%   BMI 24.41 kg/m   Physical Exam  GEN: Thin, elderly, in no acute distress  Neck: no JVD, carotid bruits, or masses Cardiac:RRR; some skipping, 2/6 systolic murmur at the left sternal border  Respiratory:  Decreased breath sounds with scattered rhonchi but clears with cough GI: soft, nontender, nondistended, + BS Ext: without cyanosis, clubbing, or edema,  Good distal pulses bilaterally Neuro:  Alert and Oriented x 3 Psych: euthymic mood, full affect  Wt Readings from Last 3 Encounters:  04/16/17 175 lb (79.4 kg)  04/12/17 176 lb 9.6 oz (80.1 kg)  04/09/17 187 lb (84.8 kg)      Studies/Labs Reviewed:   EKG:  EKG is not ordered today.   Recent Labs: 04/11/2017: B Natriuretic Peptide 1,422.0; TSH 1.587 04/12/2017: ALT 18; BUN 23; Creatinine, Ser 1.33; Hemoglobin 11.1; Platelets 210; Potassium 3.0; Sodium 137   Lipid Panel    Component Value Date/Time   CHOL 121 03/28/2017 1000   TRIG 98 03/28/2017 1000   HDL 30 (L) 03/28/2017 1000   CHOLHDL 4.0 03/28/2017 1000   VLDL 20 03/28/2017 1000   LDLCALC 71 03/28/2017 1000    Additional studies/ records that were reviewed today include:  Echo 6/18/18Study Conclusions   - Left ventricle: Septal apical and inferior wall hypokinesis The   cavity size was moderately dilated. There was mild focal basal   hypertrophy of the septum. Systolic function was moderately   reduced. The estimated ejection fraction was in the range of 35%   to 40%. Left ventricular diastolic function parameters were   normal. - Aortic valve: There was mild regurgitation. Valve area (VTI):   1.98 cm^2. Valve area (Vmax): 1.99 cm^2. Valve area (Vmean): 2.05   cm^2. - Mitral valve: Calcified annulus. There was moderate   regurgitation. - Left atrium: The atrium was moderately dilated. - Right atrium: The atrium was mildly dilated. - Atrial septum: No defect or patent foramen ovale was identified. - Pulmonary arteries: PA peak pressure: 33 mm Hg (S). - Pericardium, extracardiac: A trivial pericardial effusion was   identified posterior to the heart.   ------------------------------------------------------------------- Labs, prior tests, procedures, and      Echo 03/25/17: Study Conclusions   - Left ventricle: The cavity size was normal. There was mild   concentric hypertrophy. Systolic function was normal.  The   estimated ejection fraction was in the range of 55% to 60%. Wall  motion was normal; there were no regional wall motion   abnormalities. Doppler parameters are consistent with abnormal   left ventricular relaxation (grade 1 diastolic dysfunction). - Aortic valve: Trileaflet; mildly thickened, moderately calcified   leaflets. There was mild regurgitation. - Mitral valve: Moderately calcified annulus. There was mild   regurgitation.     LHC 03/27/17:  Severe Native Vessel CAD:  Ost LAD to Mid LAD lesion, 100 %stenosed.  Ost Ramus lesion, 100 %stenosed.  Prox Cx lesion, 70 %stenosed. Ost 1st Mrg to 1st Mrg lesion, 95 %stenosed. Ost 2nd Mrg lesion, 80 %stenosed.  Ost RCA to Dist RCA lesion, 100 %stenosed. Post Atrio lesion, 99 %stenosed (filled via retrograde flow from SV-G-PDA)  Mid Cx lesion, 100 %stenosed. After small OM2.  LIMA-LAD graft was visualized by angiography and is normal in caliber and anatomically normal. Grafted LAD is relatively small with 2 small diagonals.  SVG-OM graft was not visualized by non-selective angiography (root angiogram). Origin lesion, 100 %stenosed.  SVG-RI graft was visualized by angiography and 100% occluded. Origin lesion, 100 %stenosed.  SVG-rPDA graft was visualized by angiography and is large. Mid SVG-RCA lesion, 80 %stenosed.  Collaterals from the grafted PDA fill 2 branches of an OM as well as the ramus.  Culprit Lesion: Mid SVG-RCA lesion, 80 %stenosed  A STENT VISION RX [id number] bare metal stent was successfully placed. Using distal protection device  Post intervention, there is a 5% residual stenosis.  LV end diastolic pressure is moderately elevated.   Subdural hematoma -s/p fall which occurred approximately one month ago, fell off of a ladder -MRI brain done prior to admission, showed small chronic right convexity subdural hematoma with minimal mass effect -CT head: 9 mm thick subdural collection along the right convexity, no  midline shift or mass effect. -Neurosurgery consulted recommended the following:   CT scan yesterday evening raised question of possible acute bleeding into right convexity chronic subdural hematoma. Follow-up head CT scan this morning demonstrates resolution of this questionable acute bleeding. I believe that the previous finding yesterday was artifactual secondary to staining of the subdural space with intravascular contrast given during his coronary arteriogram. Patient is very low risk for rehemorrhage. He may be mobilized ad lib.         ASSESSMENT:    1. Chronic systolic congestive heart failure ([place])   2. Atherosclerosis of coronary artery bypass graft of native heart without angina pectoris   3. Syncope, unspecified syncope type   4. Bradycardia   5. Subdural hematoma ([facility])   6. HTN (hypertension), benign      PLAN:  In order of problems listed above:  Chronic systolic CHF LVEF 26-41% on echo 04/07/17. I saw him 04/09/17 with acute on chronic CHF and increased his Lasix. He did not diurese enough and was hospitalized overnight 04/11/17 for IV diuresis. Discharge weight was 176 pounds. He was also found to have some junctional rhythms and beta blockers were stopped.Today his weight is 175 pounds and he is well compensated. No further CHF. Continue Lasix 80 mg daily and potassium 40 mEq daily. Check be met today.  CAD status post recent MI treated with BMS to the SVG to the PDA. Plan for Plavix alone until mid-September and then switch to aspirin 81 mg daily. This is because of the subdural hematoma.  Syncope resulting in subdural hematoma currently wearing an event monitor. Beta blocker stopped because of bradycardia and junctional rhythm in the hospital.  Patient had bradycardia in the hospital beta blocker  stopped. HR 74 today.  Subdural hematoma followed closely by neurology.  Hypertension controlled  Medication Adjustments/Labs and Tests Ordered: Current medicines are  reviewed at length with the patient today.  Concerns regarding medicines are outlined above.  Medication changes, Labs and Tests ordered today are listed in the Patient Instructions below. Patient Instructions  Your physician recommends that you schedule a follow-up appointment in: 1 Month in [place] with Dr. Bronson Ing.   Your physician recommends that you continue on your current medications as directed. Please refer to the Current Medication list given to you today.  If you need a refill on your cardiac medications before your next appointment, please call your pharmacy.   Thank you for choosing [facility]!       Signed, Ermalinda Barrios, PA-C  04/16/2017 2:28 PM    [facility] [address], [place], [place]  [address] Phone: [phone]; Fax: [phone]

## 2017-04-16 NOTE — Patient Instructions (Signed)
Your physician recommends that you schedule a follow-up appointment in: 1 Month in [place] with Dr. Bronson Ing.   Your physician recommends that you continue on your current medications as directed. Please refer to the Current Medication list given to you today.  If you need a refill on your cardiac medications before your next appointment, please call your pharmacy.   Thank you for choosing [facility]!

## 2017-04-29 ENCOUNTER — Telehealth: Payer: Self-pay | Admitting: Physician Assistant

## 2017-04-29 ENCOUNTER — Ambulatory Visit
Admission: RE | Admit: 2017-04-29 | Discharge: 2017-04-29 | Disposition: A | Discharge status: Home / Self Care | Payer: PPO | Source: Ambulatory Visit | Attending: Neurosurgery | Admitting: Neurosurgery

## 2017-04-29 DIAGNOSIS — I10 Essential (primary) hypertension: Secondary | ICD-10-CM | POA: Diagnosis not present

## 2017-04-29 DIAGNOSIS — R0602 Shortness of breath: Secondary | ICD-10-CM | POA: Diagnosis not present

## 2017-04-29 DIAGNOSIS — I6203 Nontraumatic chronic subdural hemorrhage: Secondary | ICD-10-CM

## 2017-04-29 DIAGNOSIS — I6200 Nontraumatic subdural hemorrhage, unspecified: Secondary | ICD-10-CM | POA: Diagnosis not present

## 2017-04-29 DIAGNOSIS — Z85118 Personal history of other malignant neoplasm of bronchus and lung: Secondary | ICD-10-CM | POA: Diagnosis not present

## 2017-04-29 DIAGNOSIS — Z955 Presence of coronary angioplasty implant and graft: Secondary | ICD-10-CM | POA: Diagnosis not present

## 2017-04-29 DIAGNOSIS — Z79899 Other long term (current) drug therapy: Secondary | ICD-10-CM | POA: Diagnosis not present

## 2017-04-29 DIAGNOSIS — Z7951 Long term (current) use of inhaled steroids: Secondary | ICD-10-CM | POA: Diagnosis not present

## 2017-04-29 DIAGNOSIS — Z7902 Long term (current) use of antithrombotics/antiplatelets: Secondary | ICD-10-CM | POA: Diagnosis not present

## 2017-04-29 DIAGNOSIS — I252 Old myocardial infarction: Secondary | ICD-10-CM | POA: Diagnosis not present

## 2017-04-29 DIAGNOSIS — Z85828 Personal history of other malignant neoplasm of skin: Secondary | ICD-10-CM | POA: Diagnosis not present

## 2017-04-29 DIAGNOSIS — Z6826 Body mass index (BMI) 26.0-26.9, adult: Secondary | ICD-10-CM | POA: Diagnosis not present

## 2017-04-29 DIAGNOSIS — J441 Chronic obstructive pulmonary disease with (acute) exacerbation: Secondary | ICD-10-CM | POA: Diagnosis not present

## 2017-04-29 DIAGNOSIS — R319 Hematuria, unspecified: Secondary | ICD-10-CM | POA: Diagnosis not present

## 2017-04-29 DIAGNOSIS — Z87891 Personal history of nicotine dependence: Secondary | ICD-10-CM | POA: Diagnosis not present

## 2017-04-29 DIAGNOSIS — I2510 Atherosclerotic heart disease of native coronary artery without angina pectoris: Secondary | ICD-10-CM | POA: Diagnosis not present

## 2017-04-29 DIAGNOSIS — R509 Fever, unspecified: Secondary | ICD-10-CM | POA: Diagnosis not present

## 2017-04-29 NOTE — Telephone Encounter (Signed)
Left detailed message on pt's home voicemail. We have scheduled him an appointment for 05/01/17 @ 4.

## 2017-04-29 NOTE — Telephone Encounter (Signed)
Returned pt call. Pt's wife states that the pt has gained 3 lbs over the last day & 1/2 so she gave him 120 mg of lasix today. He is urinating frequently and now has noticed he is seeing "blood" while urinating. They also called his PCP and stated that they are ordering STAT labs today. She also stated that he is having trouble breathing at night while  sleeping  and is having to use 2 pillows. Please advise.

## 2017-04-29 NOTE — Telephone Encounter (Signed)
LV systolic function is normal. He can take Lasix 40 mg daily. Do not stop Plavix. Have him seen by APP at next available office visit.

## 2017-04-29 NOTE — Telephone Encounter (Signed)
Per VM from pt's wife-- pt has gained 3lbs in a day and a half- she's given him 80 mg and 1/2 of his lasix, he's having SOB, tired, and has some blood w/ urination. Please give her a call @ [phone]

## 2017-04-30 NOTE — Telephone Encounter (Signed)
Spoke with pt's wife. She voiced understanding about medication & appt.

## 2017-05-01 ENCOUNTER — Encounter: Payer: Self-pay | Admitting: Adult Health

## 2017-05-01 ENCOUNTER — Ambulatory Visit (INDEPENDENT_AMBULATORY_CARE_PROVIDER_SITE_OTHER): Payer: PPO | Admitting: Adult Health

## 2017-05-01 VITALS — BP 122/76 | HR 69 | Ht 71.0 in | Wt 174.0 lb

## 2017-05-01 DIAGNOSIS — I2510 Atherosclerotic heart disease of native coronary artery without angina pectoris: Secondary | ICD-10-CM

## 2017-05-01 DIAGNOSIS — S065XAA Traumatic subdural hemorrhage with loss of consciousness status unknown, initial encounter: Secondary | ICD-10-CM

## 2017-05-01 DIAGNOSIS — I6200 Nontraumatic subdural hemorrhage, unspecified: Secondary | ICD-10-CM | POA: Diagnosis not present

## 2017-05-01 DIAGNOSIS — I5022 Chronic systolic (congestive) heart failure: Secondary | ICD-10-CM | POA: Diagnosis not present

## 2017-05-01 DIAGNOSIS — R319 Hematuria, unspecified: Secondary | ICD-10-CM

## 2017-05-01 DIAGNOSIS — S065X9A Traumatic subdural hemorrhage with loss of consciousness of unspecified duration, initial encounter: Secondary | ICD-10-CM

## 2017-05-01 MED ORDER — NYSTATIN 100000 UNIT/ML MT SUSP: 5.0000 mL | Freq: Three times a day (TID) | OROMUCOSAL | 0 refills | Status: AC

## 2017-05-01 NOTE — Progress Notes (Signed)
Cardiology Office Note   Date:  05/01/2017   ID:  Manuel Rivera, Manuel Rivera 1945-11-01, MRN [id number]  PCP:  Manuel Percy, MD  Cardiologist: Manuel Rivera  Chief Complaint  Patient presents with  . Coronary Artery Disease  . Cardiomyopathy  . Congestive Heart Failure      History of Present Illness: Manuel Rivera is a 71 y.o. male who presents for posthospitalization follow-up after being seen at [facility] for elevated troponin. Cardiac catheterization was completed revealing occluded native arteries with 204 grafts occluded proximal is SVG to OM occluded, SVG to RI occluded, patent LIMA to LAD, patent SVG to right PDA which was 80% stenosed) the patient received bare-metal stent to SVG to RPDA. The patient was continued on Plavix only due to recent subdural hematoma. He was started on a 30 day cardiac monitor in the setting of syncopal episode.  The patient's metoprolol was discontinued. Patient was treated for decompensated systolic heart failure with most recent EF revealing 35% to 40% with ischemic cardiomyopathy. The patient was recommended to avoid AV blocking medications no ACE inhibitor was ordered due to hypotension.  He comes today with multiple complaints. His wife is also concerned about his lasix dose.   He was seen on 04/29/2017 at [facility] ER for hematuria. He has been treated with Levaquin po and is due to follow up with his PCP, Dr. Nadara Rivera tomorrow. The hematuria has stopped, but back pain and abdominal pain remain. He is not eating well. States his mouth is sore. He does not rinse his mouth out when he uses his inhalers.   Past Medical History:  Diagnosis Date  . Anginal pain ([place]) 1997  . Anxiety   . Arthritis    "hands, knees, back" (03/24/2017)  . CAD in native artery    a. s/p CABG 1997.  Marland Kitchen CHF (congestive heart failure) ([place])   . CKD (chronic kidney disease), stage III    by labs  . COPD (chronic obstructive pulmonary disease) ([place])   . Depression   .  Gout   . Hyperlipidemia   . Hypertension   . Hypothyroidism   . Lipoma    "left thigh; never treated; it's a fatty tumor" (03/24/2017)  . Memory loss   . Mild mitral regurgitation   . Myocardial infarction ([place]) 1997  . Panic attack   . Peripheral vascular disease, unspecified ([place])   . Pneumonia 02/2017  . Small cell lung cancer [facility]) 2014   diagnosed 2014 with radiation and chemotherapy  . Squamous cell carcinoma, leg    "right side; it was not basal" (03/24/2017)  . Systolic murmur     Past Surgical History:  Procedure Laterality Date  . APPENDECTOMY    . BRONCHIAL WASHINGS Left 2014   "then took biopsies"  . CARDIAC CATHETERIZATION  ?1997  . COLONOSCOPY    . COLONOSCOPY N/A 05/09/2016   Procedure: COLONOSCOPY;  Surgeon: Rogene Houston, MD;  Location[facility];  Service: Endoscopy;  Laterality: N/A;  1030  . CORONARY ARTERY BYPASS GRAFT  1997   "CABG X4"  . CORONARY STENT INTERVENTION N/A 03/27/2017   Procedure: Coronary Stent Intervention;  Surgeon: Leonie Man, MD;  Location: [place] CV LAB;  Service: Cardiovascular;  Laterality: N/A;  . HEMORRHOIDECTOMY WITH HEMORRHOID BANDING  1970s  . HERNIA REPAIR    . LEFT HEART CATH AND CORS/GRAFTS ANGIOGRAPHY N/A 03/27/2017   Procedure: Left Heart Cath and Cors/Grafts Angiography;  Surgeon: Leonie Man, MD;  Location:  [place] INVASIVE CV LAB;  Service: Cardiovascular;  Laterality: N/A;  . SQUAMOUS CELL CARCINOMA EXCISION Right    leg  . TONSILLECTOMY    . UMBILICAL HERNIA REPAIR  1985     Current Outpatient Prescriptions  Medication Sig Dispense Refill  . ADVAIR DISKUS 250-50 MCG/DOSE AEPB Inhale 1 puff into the lungs 2 (two) times daily.  12  . albuterol (PROVENTIL HFA;VENTOLIN HFA) 108 (90 Base) MCG/ACT inhaler Inhale 1-2 puffs into the lungs every 6 (six) hours as needed for wheezing or shortness of breath.    . allopurinol (ZYLOPRIM) 300 MG tablet Take 300 mg by mouth daily.  3  . ALPRAZolam (XANAX) 0.5 MG tablet  Take 1.5 mg by mouth at bedtime.   5  . beta carotene 25000 UNIT capsule Take by mouth.    . calcium carbonate (OSCAL) 1500 (600 Ca) MG TABS tablet Take 600 mg of elemental calcium by mouth 2 (two) times daily with a meal.    . clopidogrel (PLAVIX) 75 MG tablet Take 1 tablet (75 mg total) by mouth daily with breakfast. 30 tablet 0  . colchicine 0.6 MG tablet Take 0.6 mg by mouth 2 (two) times daily.  12  . furosemide (LASIX) 80 MG tablet Take 1 tablet (80 mg total) by mouth daily. 90 tablet 3  . levofloxacin (LEVAQUIN) 500 MG tablet     . levothyroxine (SYNTHROID, LEVOTHROID) 50 MCG tablet Take 50 mcg by mouth every morning.  3  . magic mouthwash SOLN TAKE ONE TEASPOONFUL (5ML) BY MOUTH FOUR TIMES DAILY FOR SORES IN MOUTH FROM CHEMOTHEROPY AS NEEDED  99  . Multiple Vitamin (MULTIVITAMIN WITH MINERALS) TABS tablet Take 1 tablet by mouth daily.    . nitroGLYCERIN (NITROSTAT) 0.4 MG SL tablet Place 1 tablet (0.4 mg total) under the tongue every 5 (five) minutes as needed for chest pain. 50 tablet 0  . potassium chloride SA (K-DUR,KLOR-CON) 20 MEQ tablet Take 2 tablets (40 mEq total) by mouth daily. 180 tablet 3  . simvastatin (ZOCOR) 40 MG tablet Take 40 mg by mouth at bedtime.  3  . tiotropium (SPIRIVA) 18 MCG inhalation capsule Place 18 mcg into inhaler and inhale at bedtime.     Marland Kitchen nystatin (MYCOSTATIN) 100000 UNIT/ML suspension Take 5 mLs (500,000 Units total) by mouth 3 (three) times daily. 60 mL 0   No current facility-administered medications for this visit.     Allergies:   Patient has no known allergies.    Social History:  The patient  reports that he quit smoking about 4 years ago. His smoking use included Cigarettes. He has a 150.00 pack-year smoking history. He has never used smokeless tobacco. He reports that he does not drink alcohol or use drugs.   Family History:  The patient's family history includes CAD in his mother.    ROS: All other systems are reviewed and negative.  Unless otherwise mentioned in H&P    PHYSICAL EXAM: VS:  BP 122/76   Pulse 69   Ht 5\' 11"  (1.803 m)   Wt 174 lb (78.9 kg)   SpO2 97%   BMI 24.27 kg/m  , BMI Body mass index is 24.27 kg/m. GEN: Well nourished, well developed, in no acute distress  HEENT: normal  Neck: no JVD, carotid bruits, or masses Cardiac: RRR;occasioanl irregular beat,  no murmurs, rubs, or gallops,no edema  Respiratory:  clear to auscultation bilaterally, normal work of breathing, with mild crackles in the right base.  GI: soft, nontender, nondistended, +  BS MS: no deformity or atrophy Port-A -Cath left upper chest.  Skin: warm and dry, no rash. Multiple areas of ecchymosis.  Neuro:  Strength and sensation are intact Psych: euthymic mood, full affect   Recent Labs: 04/11/2017: B Natriuretic Peptide 1,422.0; TSH 1.587 04/12/2017: ALT 18; Hemoglobin 11.1; Platelets 210 04/16/2017: BUN 17; Creatinine, Ser 1.33; Potassium 3.6; Sodium 137    Lipid Panel    Component Value Date/Time   CHOL 121 03/28/2017 1000   TRIG 98 03/28/2017 1000   HDL 30 (L) 03/28/2017 1000   CHOLHDL 4.0 03/28/2017 1000   VLDL 20 03/28/2017 1000   LDLCALC 71 03/28/2017 1000      Wt Readings from Last 3 Encounters:  05/01/17 174 lb (78.9 kg)  04/16/17 175 lb (79.4 kg)  04/12/17 176 lb 9.6 oz (80.1 kg)      Other studies Reviewed: Echo 04/07/17 - Left ventricle: Septal apical and inferior wall hypokinesis The cavity size was moderately dilated. There was mild focal basal hypertrophy of the septum. Systolic function was moderately reduced. The estimated ejection fraction was in the range of 35% to 40%. Left ventricular diastolic function parameters were normal. - Aortic valve: There was mild regurgitation. Valve area (VTI): 1.98 cm^2. Valve area (Vmax): 1.99 cm^2. Valve area (Vmean): 2.05 cm^2. - Mitral valve: Calcified annulus. There was moderate regurgitation. - Left atrium: The atrium was moderately  dilated. - Right atrium: The atrium was mildly dilated. - Atrial septum: No defect or patent foramen ovale was identified. - Pulmonary arteries: PA peak pressure: 33 mm Hg (S). - Pericardium, extracardiac: A trivial pericardial effusion was identified posterior to the heart.  ------------------------------------------------------------------- Labs, prior tests, procedures, and    Echo 03/25/17: Study Conclusions  - Left ventricle: The cavity size was normal. There was mild concentric hypertrophy. Systolic function was normal. The estimated ejection fraction was in the range of 55% to 60%. Wall motion was normal; there were no regional wall motion abnormalities. Doppler parameters are consistent with abnormal left ventricular relaxation (grade 1 diastolic dysfunction). - Aortic valve: Trileaflet; mildly thickened, moderately calcified leaflets. There was mild regurgitation. - Mitral valve: Moderately calcified annulus. There was mild regurgitation.   LHC 03/27/17:  Severe Native Vessel CAD:  Ost LAD to Mid LAD lesion, 100 %stenosed.  Ost Ramus lesion, 100 %stenosed.  Prox Cx lesion, 70 %stenosed. Ost 1st Mrg to 1st Mrg lesion, 95 %stenosed. Ost 2nd Mrg lesion, 80 %stenosed.  Ost RCA to Dist RCA lesion, 100 %stenosed. Post Atrio lesion, 99 %stenosed (filled via retrograde flow from SV-G-PDA)  Mid Cx lesion, 100 %stenosed. After small OM2.  LIMA-LAD graft was visualized by angiography and is normal in caliber and anatomically normal. Grafted LAD is relatively small with 2 small diagonals.  SVG-OM graft was not visualized by non-selective angiography (root angiogram). Origin lesion, 100 %stenosed.  SVG-RI graft was visualized by angiography and 100% occluded. Origin lesion, 100 %stenosed.  SVG-rPDA graft was visualized by angiography and is large. Mid SVG-RCA lesion, 80 %stenosed.  Collaterals from the grafted PDA fill 2 branches of an OM as well as the  ramus.  Culprit Lesion: Mid SVG-RCA lesion, 80 %stenosed  A STENT VISION RX [id number] bare metal stent was successfully placed. Using distal protection device  Post intervention, there is a 5% residual stenosis.  LV end diastolic pressure is moderately elevated.    ASSESSMENT AND PLAN:  1. CAD: S/P BMS to the Mid SVG to RCA lesion: He is to take Plavix for a minimum  of 3 months this will end by September.     Hematuria has stopped. He denies recurrent chest pain. He will continue statin therapy. He is not currently on ASA.   2. ICM: Most recent echocardiogram while hospitalized on 04/07/2017 demonstrated EF of 35%-40%. He will stay on lasix 80 mg daily, and continue to weigh himself. His dry wt currently is 172 lbs. He will continue low sodium diet.   3. Subdural Hematoma: Recent CT scan on 04/29/2017 demonstrated that the right convexity hematoma is slightly larger, majority is low density and chronic but there has been mild hemorrhage into the subdural hemorrhage. He is to follow up with neurology and PCP. For now, will not stop Plavix. Will discuss with Dr. Bronson Rivera.   4. Mouth pain and soreness: He is not rinsing his mouth after using inhalers. I have started him on nystatin swish and swallow for 7 days. He will follow up with his PCP for ongoing management. He is to rinse his mouth and spit after using the inhalers.   5. Hematuria: He will follow up with PCP, with possible referral to urology at his discretion.    Current medicines are reviewed at length with the patient today.    Labs/ tests ordered today include:  Phill Myron. [place] Pugh, ANP, AACC   05/01/2017 5:18 PM    [facility] [address] [place], [place] [address] Phone: [phone]; Fax: [phone]

## 2017-05-01 NOTE — Patient Instructions (Signed)
Your physician recommends that you schedule a follow-up appointment with Dr. Bronson Ing.   Your physician recommends that you continue on your current medications as directed. Please refer to the Current Medication list given to you today.  Start Nystatin  After using your inhaler please rinse your mouth   Your physician recommends that you schedule a follow-up appointment with Dr. Nadara Mustard  If you need a refill on your cardiac medications before your next appointment, please call your pharmacy.  Thank you for choosing [facility]!

## 2017-05-05 ENCOUNTER — Other Ambulatory Visit: Payer: Self-pay | Admitting: Adult Health

## 2017-05-06 DIAGNOSIS — R319 Hematuria, unspecified: Secondary | ICD-10-CM | POA: Diagnosis not present

## 2017-05-06 DIAGNOSIS — Z85118 Personal history of other malignant neoplasm of bronchus and lung: Secondary | ICD-10-CM | POA: Diagnosis not present

## 2017-05-06 DIAGNOSIS — N183 Chronic kidney disease, stage 3 (moderate): Secondary | ICD-10-CM | POA: Diagnosis not present

## 2017-05-06 DIAGNOSIS — R3129 Other microscopic hematuria: Secondary | ICD-10-CM | POA: Diagnosis not present

## 2017-05-06 DIAGNOSIS — I509 Heart failure, unspecified: Secondary | ICD-10-CM | POA: Diagnosis not present

## 2017-05-06 DIAGNOSIS — N401 Enlarged prostate with lower urinary tract symptoms: Secondary | ICD-10-CM | POA: Diagnosis not present

## 2017-05-06 DIAGNOSIS — M109 Gout, unspecified: Secondary | ICD-10-CM | POA: Diagnosis not present

## 2017-05-06 DIAGNOSIS — I6200 Nontraumatic subdural hemorrhage, unspecified: Secondary | ICD-10-CM | POA: Diagnosis not present

## 2017-05-06 DIAGNOSIS — N200 Calculus of kidney: Secondary | ICD-10-CM | POA: Diagnosis not present

## 2017-05-06 DIAGNOSIS — J449 Chronic obstructive pulmonary disease, unspecified: Secondary | ICD-10-CM | POA: Diagnosis not present

## 2017-05-06 DIAGNOSIS — I70209 Unspecified atherosclerosis of native arteries of extremities, unspecified extremity: Secondary | ICD-10-CM | POA: Diagnosis not present

## 2017-05-06 DIAGNOSIS — B370 Candidal stomatitis: Secondary | ICD-10-CM | POA: Diagnosis not present

## 2017-05-06 DIAGNOSIS — C3490 Malignant neoplasm of unspecified part of unspecified bronchus or lung: Secondary | ICD-10-CM | POA: Diagnosis not present

## 2017-05-06 DIAGNOSIS — M170 Bilateral primary osteoarthritis of knee: Secondary | ICD-10-CM | POA: Diagnosis not present

## 2017-05-06 DIAGNOSIS — C3492 Malignant neoplasm of unspecified part of left bronchus or lung: Secondary | ICD-10-CM | POA: Diagnosis not present

## 2017-05-06 DIAGNOSIS — I77811 Abdominal aortic ectasia: Secondary | ICD-10-CM | POA: Diagnosis not present

## 2017-05-06 DIAGNOSIS — I25810 Atherosclerosis of coronary artery bypass graft(s) without angina pectoris: Secondary | ICD-10-CM | POA: Diagnosis not present

## 2017-05-06 DIAGNOSIS — N400 Enlarged prostate without lower urinary tract symptoms: Secondary | ICD-10-CM | POA: Diagnosis not present

## 2017-05-06 DIAGNOSIS — I700 Atherosclerosis of aorta: Secondary | ICD-10-CM | POA: Diagnosis not present

## 2017-05-08 ENCOUNTER — Ambulatory Visit: Payer: PPO | Admitting: Cardiovascular Disease

## 2017-05-08 ENCOUNTER — Ambulatory Visit: Payer: PPO | Admitting: Internal Medicine

## 2017-05-08 DIAGNOSIS — C3490 Malignant neoplasm of unspecified part of unspecified bronchus or lung: Secondary | ICD-10-CM | POA: Diagnosis not present

## 2017-05-08 DIAGNOSIS — J440 Chronic obstructive pulmonary disease with acute lower respiratory infection: Secondary | ICD-10-CM | POA: Diagnosis not present

## 2017-05-08 DIAGNOSIS — Z6825 Body mass index (BMI) 25.0-25.9, adult: Secondary | ICD-10-CM | POA: Diagnosis not present

## 2017-05-08 DIAGNOSIS — R319 Hematuria, unspecified: Secondary | ICD-10-CM | POA: Diagnosis not present

## 2017-05-09 ENCOUNTER — Ambulatory Visit (INDEPENDENT_AMBULATORY_CARE_PROVIDER_SITE_OTHER): Payer: PPO | Admitting: Internal Medicine

## 2017-05-09 ENCOUNTER — Encounter: Payer: Self-pay | Admitting: Internal Medicine

## 2017-05-09 ENCOUNTER — Ambulatory Visit (HOSPITAL_COMMUNITY)
Admission: RE | Admit: 2017-05-09 | Discharge: 2017-05-09 | Disposition: A | Discharge status: Home / Self Care | Payer: PPO | Source: Ambulatory Visit | Attending: Internal Medicine | Admitting: Internal Medicine

## 2017-05-09 VITALS — BP 123/71 | HR 69 | Ht 71.0 in | Wt 172.0 lb

## 2017-05-09 DIAGNOSIS — N401 Enlarged prostate with lower urinary tract symptoms: Secondary | ICD-10-CM | POA: Diagnosis not present

## 2017-05-09 DIAGNOSIS — I700 Atherosclerosis of aorta: Secondary | ICD-10-CM | POA: Diagnosis not present

## 2017-05-09 DIAGNOSIS — G40111 Localization-related (focal) (partial) symptomatic epilepsy and epileptic syndromes with simple partial seizures, intractable, with status epilepticus: Secondary | ICD-10-CM | POA: Diagnosis not present

## 2017-05-09 DIAGNOSIS — N179 Acute kidney failure, unspecified: Secondary | ICD-10-CM | POA: Diagnosis not present

## 2017-05-09 DIAGNOSIS — C3490 Malignant neoplasm of unspecified part of unspecified bronchus or lung: Secondary | ICD-10-CM | POA: Diagnosis not present

## 2017-05-09 DIAGNOSIS — G9340 Encephalopathy, unspecified: Secondary | ICD-10-CM | POA: Diagnosis not present

## 2017-05-09 DIAGNOSIS — R9401 Abnormal electroencephalogram [EEG]: Secondary | ICD-10-CM | POA: Diagnosis not present

## 2017-05-09 DIAGNOSIS — J9601 Acute respiratory failure with hypoxia: Secondary | ICD-10-CM | POA: Diagnosis not present

## 2017-05-09 DIAGNOSIS — T17320A Food in larynx causing asphyxiation, initial encounter: Secondary | ICD-10-CM | POA: Diagnosis not present

## 2017-05-09 DIAGNOSIS — E785 Hyperlipidemia, unspecified: Secondary | ICD-10-CM | POA: Diagnosis not present

## 2017-05-09 DIAGNOSIS — G9341 Metabolic encephalopathy: Secondary | ICD-10-CM | POA: Diagnosis not present

## 2017-05-09 DIAGNOSIS — I5022 Chronic systolic (congestive) heart failure: Secondary | ICD-10-CM | POA: Diagnosis not present

## 2017-05-09 DIAGNOSIS — R7989 Other specified abnormal findings of blood chemistry: Secondary | ICD-10-CM | POA: Diagnosis not present

## 2017-05-09 DIAGNOSIS — R740 Nonspecific elevation of levels of transaminase and lactic acid dehydrogenase [LDH]: Secondary | ICD-10-CM | POA: Diagnosis not present

## 2017-05-09 DIAGNOSIS — R0602 Shortness of breath: Secondary | ICD-10-CM

## 2017-05-09 DIAGNOSIS — I639 Cerebral infarction, unspecified: Secondary | ICD-10-CM | POA: Diagnosis not present

## 2017-05-09 DIAGNOSIS — R4781 Slurred speech: Secondary | ICD-10-CM | POA: Diagnosis not present

## 2017-05-09 DIAGNOSIS — I6200 Nontraumatic subdural hemorrhage, unspecified: Secondary | ICD-10-CM | POA: Diagnosis not present

## 2017-05-09 DIAGNOSIS — R29818 Other symptoms and signs involving the nervous system: Secondary | ICD-10-CM | POA: Diagnosis not present

## 2017-05-09 DIAGNOSIS — J449 Chronic obstructive pulmonary disease, unspecified: Secondary | ICD-10-CM | POA: Diagnosis not present

## 2017-05-09 DIAGNOSIS — Z955 Presence of coronary angioplasty implant and graft: Secondary | ICD-10-CM | POA: Diagnosis not present

## 2017-05-09 DIAGNOSIS — I739 Peripheral vascular disease, unspecified: Secondary | ICD-10-CM | POA: Diagnosis not present

## 2017-05-09 DIAGNOSIS — R0603 Acute respiratory distress: Secondary | ICD-10-CM | POA: Diagnosis not present

## 2017-05-09 DIAGNOSIS — Z87891 Personal history of nicotine dependence: Secondary | ICD-10-CM | POA: Diagnosis not present

## 2017-05-09 DIAGNOSIS — Z7902 Long term (current) use of antithrombotics/antiplatelets: Secondary | ICD-10-CM | POA: Diagnosis not present

## 2017-05-09 DIAGNOSIS — Z951 Presence of aortocoronary bypass graft: Secondary | ICD-10-CM | POA: Diagnosis not present

## 2017-05-09 DIAGNOSIS — I6789 Other cerebrovascular disease: Secondary | ICD-10-CM | POA: Diagnosis not present

## 2017-05-09 DIAGNOSIS — G40211 Localization-related (focal) (partial) symptomatic epilepsy and epileptic syndromes with complex partial seizures, intractable, with status epilepticus: Secondary | ICD-10-CM | POA: Diagnosis not present

## 2017-05-09 DIAGNOSIS — I10 Essential (primary) hypertension: Secondary | ICD-10-CM | POA: Diagnosis not present

## 2017-05-09 DIAGNOSIS — I130 Hypertensive heart and chronic kidney disease with heart failure and stage 1 through stage 4 chronic kidney disease, or unspecified chronic kidney disease: Secondary | ICD-10-CM | POA: Diagnosis not present

## 2017-05-09 DIAGNOSIS — I6782 Cerebral ischemia: Secondary | ICD-10-CM | POA: Diagnosis not present

## 2017-05-09 DIAGNOSIS — Z7409 Other reduced mobility: Secondary | ICD-10-CM | POA: Diagnosis not present

## 2017-05-09 DIAGNOSIS — E440 Moderate protein-calorie malnutrition: Secondary | ICD-10-CM | POA: Diagnosis not present

## 2017-05-09 DIAGNOSIS — M6281 Muscle weakness (generalized): Secondary | ICD-10-CM | POA: Diagnosis not present

## 2017-05-09 DIAGNOSIS — Z4682 Encounter for fitting and adjustment of non-vascular catheter: Secondary | ICD-10-CM | POA: Diagnosis not present

## 2017-05-09 DIAGNOSIS — R2689 Other abnormalities of gait and mobility: Secondary | ICD-10-CM | POA: Diagnosis not present

## 2017-05-09 DIAGNOSIS — G40909 Epilepsy, unspecified, not intractable, without status epilepticus: Secondary | ICD-10-CM | POA: Diagnosis not present

## 2017-05-09 DIAGNOSIS — M109 Gout, unspecified: Secondary | ICD-10-CM | POA: Diagnosis not present

## 2017-05-09 DIAGNOSIS — Z931 Gastrostomy status: Secondary | ICD-10-CM | POA: Diagnosis not present

## 2017-05-09 DIAGNOSIS — R0989 Other specified symptoms and signs involving the circulatory and respiratory systems: Secondary | ICD-10-CM | POA: Diagnosis not present

## 2017-05-09 DIAGNOSIS — F419 Anxiety disorder, unspecified: Secondary | ICD-10-CM | POA: Diagnosis not present

## 2017-05-09 DIAGNOSIS — E7800 Pure hypercholesterolemia, unspecified: Secondary | ICD-10-CM | POA: Diagnosis not present

## 2017-05-09 DIAGNOSIS — D649 Anemia, unspecified: Secondary | ICD-10-CM | POA: Diagnosis not present

## 2017-05-09 DIAGNOSIS — R561 Post traumatic seizures: Secondary | ICD-10-CM | POA: Diagnosis not present

## 2017-05-09 DIAGNOSIS — G9389 Other specified disorders of brain: Secondary | ICD-10-CM | POA: Diagnosis not present

## 2017-05-09 DIAGNOSIS — Z85118 Personal history of other malignant neoplasm of bronchus and lung: Secondary | ICD-10-CM | POA: Insufficient documentation

## 2017-05-09 DIAGNOSIS — J90 Pleural effusion, not elsewhere classified: Secondary | ICD-10-CM | POA: Diagnosis not present

## 2017-05-09 DIAGNOSIS — R1312 Dysphagia, oropharyngeal phase: Secondary | ICD-10-CM | POA: Diagnosis not present

## 2017-05-09 DIAGNOSIS — I083 Combined rheumatic disorders of mitral, aortic and tricuspid valves: Secondary | ICD-10-CM | POA: Diagnosis not present

## 2017-05-09 DIAGNOSIS — I517 Cardiomegaly: Secondary | ICD-10-CM | POA: Diagnosis not present

## 2017-05-09 DIAGNOSIS — R29701 NIHSS score 1: Secondary | ICD-10-CM | POA: Diagnosis not present

## 2017-05-09 DIAGNOSIS — R1310 Dysphagia, unspecified: Secondary | ICD-10-CM | POA: Diagnosis not present

## 2017-05-09 DIAGNOSIS — Z85828 Personal history of other malignant neoplasm of skin: Secondary | ICD-10-CM | POA: Diagnosis not present

## 2017-05-09 DIAGNOSIS — R278 Other lack of coordination: Secondary | ICD-10-CM | POA: Diagnosis not present

## 2017-05-09 DIAGNOSIS — R05 Cough: Secondary | ICD-10-CM | POA: Diagnosis not present

## 2017-05-09 DIAGNOSIS — E870 Hyperosmolality and hypernatremia: Secondary | ICD-10-CM | POA: Diagnosis not present

## 2017-05-09 DIAGNOSIS — J811 Chronic pulmonary edema: Secondary | ICD-10-CM | POA: Diagnosis not present

## 2017-05-09 DIAGNOSIS — I69354 Hemiplegia and hemiparesis following cerebral infarction affecting left non-dominant side: Secondary | ICD-10-CM | POA: Diagnosis not present

## 2017-05-09 DIAGNOSIS — Z79899 Other long term (current) drug therapy: Secondary | ICD-10-CM | POA: Diagnosis not present

## 2017-05-09 DIAGNOSIS — I2510 Atherosclerotic heart disease of native coronary artery without angina pectoris: Secondary | ICD-10-CM | POA: Diagnosis not present

## 2017-05-09 DIAGNOSIS — Z1389 Encounter for screening for other disorder: Secondary | ICD-10-CM | POA: Diagnosis not present

## 2017-05-09 DIAGNOSIS — R1311 Dysphagia, oral phase: Secondary | ICD-10-CM | POA: Diagnosis not present

## 2017-05-09 DIAGNOSIS — I672 Cerebral atherosclerosis: Secondary | ICD-10-CM | POA: Diagnosis not present

## 2017-05-09 DIAGNOSIS — R269 Unspecified abnormalities of gait and mobility: Secondary | ICD-10-CM | POA: Diagnosis not present

## 2017-05-09 DIAGNOSIS — I519 Heart disease, unspecified: Secondary | ICD-10-CM | POA: Diagnosis not present

## 2017-05-09 DIAGNOSIS — R531 Weakness: Secondary | ICD-10-CM | POA: Diagnosis not present

## 2017-05-09 DIAGNOSIS — S065X9A Traumatic subdural hemorrhage with loss of consciousness of unspecified duration, initial encounter: Secondary | ICD-10-CM | POA: Diagnosis not present

## 2017-05-09 DIAGNOSIS — S065X1A Traumatic subdural hemorrhage with loss of consciousness of 30 minutes or less, initial encounter: Secondary | ICD-10-CM | POA: Diagnosis not present

## 2017-05-09 DIAGNOSIS — I6523 Occlusion and stenosis of bilateral carotid arteries: Secondary | ICD-10-CM | POA: Diagnosis not present

## 2017-05-09 DIAGNOSIS — I6203 Nontraumatic chronic subdural hemorrhage: Secondary | ICD-10-CM | POA: Diagnosis not present

## 2017-05-09 DIAGNOSIS — I771 Stricture of artery: Secondary | ICD-10-CM | POA: Diagnosis not present

## 2017-05-09 DIAGNOSIS — N281 Cyst of kidney, acquired: Secondary | ICD-10-CM | POA: Diagnosis not present

## 2017-05-09 DIAGNOSIS — J9811 Atelectasis: Secondary | ICD-10-CM | POA: Diagnosis not present

## 2017-05-09 DIAGNOSIS — R9082 White matter disease, unspecified: Secondary | ICD-10-CM | POA: Diagnosis not present

## 2017-05-09 DIAGNOSIS — R918 Other nonspecific abnormal finding of lung field: Secondary | ICD-10-CM | POA: Diagnosis not present

## 2017-05-09 DIAGNOSIS — G8194 Hemiplegia, unspecified affecting left nondominant side: Secondary | ICD-10-CM | POA: Diagnosis not present

## 2017-05-09 DIAGNOSIS — F329 Major depressive disorder, single episode, unspecified: Secondary | ICD-10-CM | POA: Diagnosis not present

## 2017-05-09 DIAGNOSIS — G47419 Narcolepsy without cataplexy: Secondary | ICD-10-CM | POA: Diagnosis not present

## 2017-05-09 DIAGNOSIS — E039 Hypothyroidism, unspecified: Secondary | ICD-10-CM | POA: Diagnosis not present

## 2017-05-09 DIAGNOSIS — Z7951 Long term (current) use of inhaled steroids: Secondary | ICD-10-CM | POA: Diagnosis not present

## 2017-05-09 DIAGNOSIS — Z9911 Dependence on respirator [ventilator] status: Secondary | ICD-10-CM | POA: Diagnosis not present

## 2017-05-09 DIAGNOSIS — I69992 Facial weakness following unspecified cerebrovascular disease: Secondary | ICD-10-CM | POA: Diagnosis not present

## 2017-05-09 DIAGNOSIS — S065X9D Traumatic subdural hemorrhage with loss of consciousness of unspecified duration, subsequent encounter: Secondary | ICD-10-CM | POA: Diagnosis not present

## 2017-05-09 NOTE — Patient Instructions (Signed)
Medication Instructions:  Your physician recommends that you continue on your current medications as directed. Please refer to the Current Medication list given to you today.   Labwork: NONE  Testing/Procedures: A chest x-ray takes a picture of the organs and structures inside the chest, including the heart, lungs, and blood vessels. This test can show several things, including, whether the heart is enlarges; whether fluid is building up in the lungs; and whether pacemaker / defibrillator leads are still in place.   Follow-Up: Your physician recommends that you schedule a follow-up appointment in: 3-5 [place]    Any Other Special Instructions Will Be Listed Below (If Applicable).     If you need a refill on your cardiac medications before your next appointment, please call your pharmacy.

## 2017-05-11 NOTE — Progress Notes (Signed)
HPI Manuel Rivera is referred today by [name] for evaluation of bradycardia. He is a pleasant 71 yo man with lung CA, s/p XRT and chemo over 4 years ago. He had an episode of altered consciousness where he fell off of a ladder without warning. He was found to have marked sinus bradycardia and his beta blocker was stopped. He wore a cardiac monitor and was described to have one or two bradycardia episodes. He has not had any additional episodes of syncope. He is s/p bypass in the past and has moderate LV dysfunction with an EF by echo of 35-40%. He cannot take beta blockers due to the bradycardia and ACE inhibitors have led to symptomatic hypotension. He also has copd and chronic stage 3 renal insufficiency. The patients main complaint is fatigue. He was able to work in his garden until 5 weeks ago. He denies chest pain. He has dyspnea with exertion but malaise and a lack of energy are his biggest complaints. His heart monitor is not available for me to view but his report suggests that his HR was not low in the setting of his malaise and weakness.   No Known Allergies   Current Outpatient Prescriptions  Medication Sig Dispense Refill  . albuterol (PROVENTIL HFA;VENTOLIN HFA) 108 (90 Base) MCG/ACT inhaler Inhale 1-2 puffs into the lungs every 6 (six) hours as needed for wheezing or shortness of breath.    Marland Kitchen albuterol (PROVENTIL) (5 MG/ML) 0.5% nebulizer solution Take 2.5 mg by nebulization every 6 (six) hours as needed for wheezing or shortness of breath.    . allopurinol (ZYLOPRIM) 300 MG tablet Take 300 mg by mouth daily.  3  . ALPRAZolam (XANAX) 0.5 MG tablet Take 1.5 mg by mouth at bedtime.   5  . beta carotene 25000 UNIT capsule Take by mouth.    . budesonide (PULMICORT) 0.5 MG/2ML nebulizer solution Take 0.5 mg by nebulization 2 (two) times daily.    . calcium carbonate (OSCAL) 1500 (600 Ca) MG TABS tablet Take 600 mg of elemental calcium by mouth 2 (two) times daily with a meal.    .  clopidogrel (PLAVIX) 75 MG tablet Take 1 tablet (75 mg total) by mouth daily with breakfast. 30 tablet 0  . colchicine 0.6 MG tablet Take 0.6 mg by mouth 2 (two) times daily.  12  . furosemide (LASIX) 80 MG tablet Take 1 tablet (80 mg total) by mouth daily. 90 tablet 3  . levofloxacin (LEVAQUIN) 500 MG tablet     . levothyroxine (SYNTHROID, LEVOTHROID) 50 MCG tablet Take 50 mcg by mouth every morning.  3  . magic mouthwash SOLN TAKE ONE TEASPOONFUL (5ML) BY MOUTH FOUR TIMES DAILY FOR SORES IN MOUTH FROM CHEMOTHEROPY AS NEEDED  99  . Multiple Vitamin (MULTIVITAMIN WITH MINERALS) TABS tablet Take 1 tablet by mouth daily.    . nitroGLYCERIN (NITROSTAT) 0.4 MG SL tablet Place 1 tablet (0.4 mg total) under the tongue every 5 (five) minutes as needed for chest pain. 50 tablet 0  . nystatin (MYCOSTATIN) 100000 UNIT/ML suspension Take 5 mLs (500,000 Units total) by mouth 3 (three) times daily. 60 mL 0  . potassium chloride SA (K-DUR,KLOR-CON) 20 MEQ tablet Take 2 tablets (40 mEq total) by mouth daily. 180 tablet 3  . simvastatin (ZOCOR) 40 MG tablet Take 40 mg by mouth at bedtime.  3  . tiotropium (SPIRIVA) 18 MCG inhalation capsule Place 18 mcg into inhaler and inhale at bedtime.  No current facility-administered medications for this visit.      Past Medical History:  Diagnosis Date  . Anginal pain ([place]) 1997  . Anxiety   . Arthritis    "hands, knees, back" (03/24/2017)  . CAD in native artery    a. s/p CABG 1997.  Marland Kitchen CHF (congestive heart failure) ([place])   . CKD (chronic kidney disease), stage III    by labs  . COPD (chronic obstructive pulmonary disease) ([place])   . Depression   . Gout   . Hyperlipidemia   . Hypertension   . Hypothyroidism   . Lipoma    "left thigh; never treated; it's a fatty tumor" (03/24/2017)  . Memory loss   . Mild mitral regurgitation   . Myocardial infarction ([place]) 1997  . Panic attack   . Peripheral vascular disease, unspecified ([place])   . Pneumonia 02/2017  .  Small cell lung cancer [facility]) 2014   diagnosed 2014 with radiation and chemotherapy  . Squamous cell carcinoma, leg    "right side; it was not basal" (03/24/2017)  . Systolic murmur     ROS:   All systems reviewed and negative except as noted in the HPI.   Past Surgical History:  Procedure Laterality Date  . APPENDECTOMY    . BRONCHIAL WASHINGS Left 2014   "then took biopsies"  . CARDIAC CATHETERIZATION  ?1997  . COLONOSCOPY    . COLONOSCOPY N/A 05/09/2016   Procedure: COLONOSCOPY;  Surgeon: Rogene Houston, MD;  [facility];  Service: Endoscopy;  Laterality: N/A;  1030  . CORONARY ARTERY BYPASS GRAFT  1997   "CABG X4"  . CORONARY STENT INTERVENTION N/A 03/27/2017   Procedure: Coronary Stent Intervention;  Surgeon: Leonie Man, MD;  Location: [place] CV LAB;  Service: Cardiovascular;  Laterality: N/A;  . HEMORRHOIDECTOMY WITH HEMORRHOID BANDING  1970s  . HERNIA REPAIR    . LEFT HEART CATH AND CORS/GRAFTS ANGIOGRAPHY N/A 03/27/2017   Procedure: Left Heart Cath and Cors/Grafts Angiography;  Surgeon: Leonie Man, MD;  Location: [place] CV LAB;  Service: Cardiovascular;  Laterality: N/A;  . SQUAMOUS CELL CARCINOMA EXCISION Right    leg  . TONSILLECTOMY    . UMBILICAL HERNIA REPAIR  1985     Family History  Problem Relation Age of Onset  . CAD Mother        MI 71     Social History   Social History  . Marital status: Married    Spouse name: N/A  . Number of children: N/A  . Years of education: N/A   Occupational History  . Not on file.   Social History Main Topics  . Smoking status: Former Smoker    Packs/day: 3.00    Years: 50.00    Types: Cigarettes    Quit date: 05/09/2012  . Smokeless tobacco: Never Used     Comment: 03/24/2017 "used e-cigarettes for a short time when trying to quit regular cigarettes"  . Alcohol use No  . Drug use: No  . Sexual activity: Not Currently   Other Topics Concern  . Not on file   Social History  Narrative  . No narrative on file     BP 123/71   Pulse 69   Ht 5\' 11"  (1.803 m)   Wt 172 lb (78 kg)   SpO2 96% Comment: on room air  BMI 23.99 kg/m   Physical Exam:  frail appearing 71 yo man, NAD HEENT: Unremarkable Neck:  No  JVD, no thyromegally Lymphatics:  No adenopathy Back:  No CVA tenderness Lungs:  Clear but decreased breath sounds throughout HEART:  Regular rate rhythm, no murmurs, no rubs, no clicks Abd:  soft, positive bowel sounds, no organomegally, no rebound, no guarding Ext:  2 plus pulses, no edema, no cyanosis, no clubbing Skin:  No rashes no nodules Neuro:  CN II through XII intact, motor grossly intact  EKG - sinus bradycardia with pvcs  Assess/Plan: 1. Fatigue and malaise - the etiology is unclear. I am burdened by no clear explanation. I hope the answer is in the rest of his heart monitor but the available information suggests otherwise. His HR in the office today is not low. I might have him wear a 48 hour holter as his symptoms are constant and a normal heart rate with fatigue and malaise would rule out symptomatic sinus node dysfunction and/or PVC's as the etiology.  2. Chronic systolic heart failure - his EF is noted. He cannot take any guideline directed therapy due to bradycardia and hypotension. 3. Lung CA - it is unclear what affect this is having. He tells me his cancer doctors state that the cancer is not growing but not cured.  4. Copd - he will continue his bronchodilators.  Mikle Bosworth.D.

## 2017-05-14 ENCOUNTER — Telehealth: Payer: Self-pay | Admitting: *Deleted

## 2017-05-14 NOTE — Telephone Encounter (Signed)
Called patient with test results. No answer. Left message to call back.  

## 2017-05-14 NOTE — Telephone Encounter (Signed)
-----   Message from Fay Records, MD sent at 05/09/2017  2:58 PM EDT ----- See note to Beckie Salts

## 2017-05-20 ENCOUNTER — Ambulatory Visit: Payer: PPO | Admitting: Cardiovascular Disease

## 2017-05-29 ENCOUNTER — Other Ambulatory Visit (HOSPITAL_COMMUNITY): Payer: PPO

## 2017-05-30 DIAGNOSIS — R1311 Dysphagia, oral phase: Secondary | ICD-10-CM | POA: Diagnosis not present

## 2017-05-30 DIAGNOSIS — S065X9A Traumatic subdural hemorrhage with loss of consciousness of unspecified duration, initial encounter: Secondary | ICD-10-CM | POA: Diagnosis not present

## 2017-05-30 DIAGNOSIS — F419 Anxiety disorder, unspecified: Secondary | ICD-10-CM | POA: Diagnosis not present

## 2017-05-30 DIAGNOSIS — S065X9D Traumatic subdural hemorrhage with loss of consciousness of unspecified duration, subsequent encounter: Secondary | ICD-10-CM | POA: Diagnosis not present

## 2017-05-30 DIAGNOSIS — R278 Other lack of coordination: Secondary | ICD-10-CM | POA: Diagnosis not present

## 2017-05-30 DIAGNOSIS — G47419 Narcolepsy without cataplexy: Secondary | ICD-10-CM | POA: Diagnosis not present

## 2017-05-30 DIAGNOSIS — F329 Major depressive disorder, single episode, unspecified: Secondary | ICD-10-CM | POA: Diagnosis not present

## 2017-05-30 DIAGNOSIS — R05 Cough: Secondary | ICD-10-CM | POA: Diagnosis not present

## 2017-05-30 DIAGNOSIS — Z7409 Other reduced mobility: Secondary | ICD-10-CM | POA: Diagnosis not present

## 2017-05-30 DIAGNOSIS — J9601 Acute respiratory failure with hypoxia: Secondary | ICD-10-CM | POA: Diagnosis not present

## 2017-05-30 DIAGNOSIS — M109 Gout, unspecified: Secondary | ICD-10-CM | POA: Diagnosis not present

## 2017-05-30 DIAGNOSIS — N179 Acute kidney failure, unspecified: Secondary | ICD-10-CM | POA: Diagnosis not present

## 2017-05-30 DIAGNOSIS — G40211 Localization-related (focal) (partial) symptomatic epilepsy and epileptic syndromes with complex partial seizures, intractable, with status epilepticus: Secondary | ICD-10-CM | POA: Diagnosis not present

## 2017-05-30 DIAGNOSIS — E785 Hyperlipidemia, unspecified: Secondary | ICD-10-CM | POA: Diagnosis not present

## 2017-05-30 DIAGNOSIS — E039 Hypothyroidism, unspecified: Secondary | ICD-10-CM | POA: Diagnosis not present

## 2017-05-30 DIAGNOSIS — D649 Anemia, unspecified: Secondary | ICD-10-CM | POA: Diagnosis not present

## 2017-05-30 DIAGNOSIS — R2689 Other abnormalities of gait and mobility: Secondary | ICD-10-CM | POA: Diagnosis not present

## 2017-05-30 DIAGNOSIS — R1310 Dysphagia, unspecified: Secondary | ICD-10-CM | POA: Diagnosis not present

## 2017-05-30 DIAGNOSIS — C3490 Malignant neoplasm of unspecified part of unspecified bronchus or lung: Secondary | ICD-10-CM | POA: Diagnosis not present

## 2017-05-30 DIAGNOSIS — I10 Essential (primary) hypertension: Secondary | ICD-10-CM | POA: Diagnosis not present

## 2017-05-30 DIAGNOSIS — Z931 Gastrostomy status: Secondary | ICD-10-CM | POA: Diagnosis not present

## 2017-05-30 DIAGNOSIS — S065X1A Traumatic subdural hemorrhage with loss of consciousness of 30 minutes or less, initial encounter: Secondary | ICD-10-CM | POA: Diagnosis not present

## 2017-05-30 DIAGNOSIS — T17320A Food in larynx causing asphyxiation, initial encounter: Secondary | ICD-10-CM | POA: Diagnosis not present

## 2017-05-30 DIAGNOSIS — J449 Chronic obstructive pulmonary disease, unspecified: Secondary | ICD-10-CM | POA: Diagnosis not present

## 2017-05-30 DIAGNOSIS — I2510 Atherosclerotic heart disease of native coronary artery without angina pectoris: Secondary | ICD-10-CM | POA: Diagnosis not present

## 2017-05-30 DIAGNOSIS — G40909 Epilepsy, unspecified, not intractable, without status epilepticus: Secondary | ICD-10-CM | POA: Diagnosis not present

## 2017-05-30 DIAGNOSIS — M6281 Muscle weakness (generalized): Secondary | ICD-10-CM | POA: Diagnosis not present

## 2017-06-03 ENCOUNTER — Ambulatory Visit (HOSPITAL_COMMUNITY): Payer: PPO

## 2017-06-04 ENCOUNTER — Ambulatory Visit (HOSPITAL_COMMUNITY): Payer: PPO | Admitting: Adult Health

## 2017-06-07 DIAGNOSIS — S065X9D Traumatic subdural hemorrhage with loss of consciousness of unspecified duration, subsequent encounter: Secondary | ICD-10-CM | POA: Diagnosis not present

## 2017-06-19 ENCOUNTER — Ambulatory Visit: Payer: PPO | Admitting: Internal Medicine

## 2017-06-21 DIAGNOSIS — Z7951 Long term (current) use of inhaled steroids: Secondary | ICD-10-CM | POA: Diagnosis not present

## 2017-06-21 DIAGNOSIS — G47419 Narcolepsy without cataplexy: Secondary | ICD-10-CM | POA: Diagnosis not present

## 2017-06-21 DIAGNOSIS — G40909 Epilepsy, unspecified, not intractable, without status epilepticus: Secondary | ICD-10-CM | POA: Diagnosis not present

## 2017-06-21 DIAGNOSIS — L989 Disorder of the skin and subcutaneous tissue, unspecified: Secondary | ICD-10-CM | POA: Diagnosis not present

## 2017-06-21 DIAGNOSIS — G8194 Hemiplegia, unspecified affecting left nondominant side: Secondary | ICD-10-CM | POA: Diagnosis not present

## 2017-06-21 DIAGNOSIS — J449 Chronic obstructive pulmonary disease, unspecified: Secondary | ICD-10-CM | POA: Diagnosis not present

## 2017-06-21 DIAGNOSIS — F329 Major depressive disorder, single episode, unspecified: Secondary | ICD-10-CM | POA: Diagnosis not present

## 2017-06-21 DIAGNOSIS — F419 Anxiety disorder, unspecified: Secondary | ICD-10-CM | POA: Diagnosis not present

## 2017-06-21 DIAGNOSIS — R1311 Dysphagia, oral phase: Secondary | ICD-10-CM | POA: Diagnosis not present

## 2017-06-21 DIAGNOSIS — C3490 Malignant neoplasm of unspecified part of unspecified bronchus or lung: Secondary | ICD-10-CM | POA: Diagnosis not present

## 2017-06-21 DIAGNOSIS — Z79891 Long term (current) use of opiate analgesic: Secondary | ICD-10-CM | POA: Diagnosis not present

## 2017-06-21 DIAGNOSIS — S065X9D Traumatic subdural hemorrhage with loss of consciousness of unspecified duration, subsequent encounter: Secondary | ICD-10-CM | POA: Diagnosis not present

## 2017-06-21 DIAGNOSIS — D696 Thrombocytopenia, unspecified: Secondary | ICD-10-CM | POA: Diagnosis not present

## 2017-06-21 DIAGNOSIS — I10 Essential (primary) hypertension: Secondary | ICD-10-CM | POA: Diagnosis not present

## 2017-06-21 DIAGNOSIS — Z7902 Long term (current) use of antithrombotics/antiplatelets: Secondary | ICD-10-CM | POA: Diagnosis not present

## 2017-06-21 DIAGNOSIS — M109 Gout, unspecified: Secondary | ICD-10-CM | POA: Diagnosis not present

## 2017-06-21 DIAGNOSIS — I2510 Atherosclerotic heart disease of native coronary artery without angina pectoris: Secondary | ICD-10-CM | POA: Diagnosis not present

## 2017-06-21 DIAGNOSIS — Z431 Encounter for attention to gastrostomy: Secondary | ICD-10-CM | POA: Diagnosis not present

## 2017-06-21 DIAGNOSIS — E785 Hyperlipidemia, unspecified: Secondary | ICD-10-CM | POA: Diagnosis not present

## 2017-06-23 DIAGNOSIS — D696 Thrombocytopenia, unspecified: Secondary | ICD-10-CM | POA: Diagnosis not present

## 2017-06-23 DIAGNOSIS — E785 Hyperlipidemia, unspecified: Secondary | ICD-10-CM | POA: Diagnosis not present

## 2017-06-23 DIAGNOSIS — L989 Disorder of the skin and subcutaneous tissue, unspecified: Secondary | ICD-10-CM | POA: Diagnosis not present

## 2017-06-23 DIAGNOSIS — Z79891 Long term (current) use of opiate analgesic: Secondary | ICD-10-CM | POA: Diagnosis not present

## 2017-06-23 DIAGNOSIS — I10 Essential (primary) hypertension: Secondary | ICD-10-CM | POA: Diagnosis not present

## 2017-06-23 DIAGNOSIS — G8194 Hemiplegia, unspecified affecting left nondominant side: Secondary | ICD-10-CM | POA: Diagnosis not present

## 2017-06-23 DIAGNOSIS — Z7902 Long term (current) use of antithrombotics/antiplatelets: Secondary | ICD-10-CM | POA: Diagnosis not present

## 2017-06-23 DIAGNOSIS — Z431 Encounter for attention to gastrostomy: Secondary | ICD-10-CM | POA: Diagnosis not present

## 2017-06-23 DIAGNOSIS — F329 Major depressive disorder, single episode, unspecified: Secondary | ICD-10-CM | POA: Diagnosis not present

## 2017-06-23 DIAGNOSIS — C3490 Malignant neoplasm of unspecified part of unspecified bronchus or lung: Secondary | ICD-10-CM | POA: Diagnosis not present

## 2017-06-23 DIAGNOSIS — M109 Gout, unspecified: Secondary | ICD-10-CM | POA: Diagnosis not present

## 2017-06-23 DIAGNOSIS — Z7951 Long term (current) use of inhaled steroids: Secondary | ICD-10-CM | POA: Diagnosis not present

## 2017-06-23 DIAGNOSIS — F419 Anxiety disorder, unspecified: Secondary | ICD-10-CM | POA: Diagnosis not present

## 2017-06-23 DIAGNOSIS — S065X9D Traumatic subdural hemorrhage with loss of consciousness of unspecified duration, subsequent encounter: Secondary | ICD-10-CM | POA: Diagnosis not present

## 2017-06-23 DIAGNOSIS — G47419 Narcolepsy without cataplexy: Secondary | ICD-10-CM | POA: Diagnosis not present

## 2017-06-23 DIAGNOSIS — R1311 Dysphagia, oral phase: Secondary | ICD-10-CM | POA: Diagnosis not present

## 2017-06-23 DIAGNOSIS — I2510 Atherosclerotic heart disease of native coronary artery without angina pectoris: Secondary | ICD-10-CM | POA: Diagnosis not present

## 2017-06-23 DIAGNOSIS — J449 Chronic obstructive pulmonary disease, unspecified: Secondary | ICD-10-CM | POA: Diagnosis not present

## 2017-06-23 DIAGNOSIS — G40909 Epilepsy, unspecified, not intractable, without status epilepticus: Secondary | ICD-10-CM | POA: Diagnosis not present

## 2017-06-24 DIAGNOSIS — M109 Gout, unspecified: Secondary | ICD-10-CM | POA: Diagnosis not present

## 2017-06-24 DIAGNOSIS — G40909 Epilepsy, unspecified, not intractable, without status epilepticus: Secondary | ICD-10-CM | POA: Diagnosis not present

## 2017-06-24 DIAGNOSIS — I2510 Atherosclerotic heart disease of native coronary artery without angina pectoris: Secondary | ICD-10-CM | POA: Diagnosis not present

## 2017-06-24 DIAGNOSIS — G47419 Narcolepsy without cataplexy: Secondary | ICD-10-CM | POA: Diagnosis not present

## 2017-06-24 DIAGNOSIS — R1311 Dysphagia, oral phase: Secondary | ICD-10-CM | POA: Diagnosis not present

## 2017-06-24 DIAGNOSIS — S065X9D Traumatic subdural hemorrhage with loss of consciousness of unspecified duration, subsequent encounter: Secondary | ICD-10-CM | POA: Diagnosis not present

## 2017-06-24 DIAGNOSIS — I10 Essential (primary) hypertension: Secondary | ICD-10-CM | POA: Diagnosis not present

## 2017-06-24 DIAGNOSIS — Z7951 Long term (current) use of inhaled steroids: Secondary | ICD-10-CM | POA: Diagnosis not present

## 2017-06-24 DIAGNOSIS — G8194 Hemiplegia, unspecified affecting left nondominant side: Secondary | ICD-10-CM | POA: Diagnosis not present

## 2017-06-24 DIAGNOSIS — Z431 Encounter for attention to gastrostomy: Secondary | ICD-10-CM | POA: Diagnosis not present

## 2017-06-24 DIAGNOSIS — Z7902 Long term (current) use of antithrombotics/antiplatelets: Secondary | ICD-10-CM | POA: Diagnosis not present

## 2017-06-24 DIAGNOSIS — F329 Major depressive disorder, single episode, unspecified: Secondary | ICD-10-CM | POA: Diagnosis not present

## 2017-06-24 DIAGNOSIS — C3490 Malignant neoplasm of unspecified part of unspecified bronchus or lung: Secondary | ICD-10-CM | POA: Diagnosis not present

## 2017-06-24 DIAGNOSIS — L989 Disorder of the skin and subcutaneous tissue, unspecified: Secondary | ICD-10-CM | POA: Diagnosis not present

## 2017-06-24 DIAGNOSIS — Z79891 Long term (current) use of opiate analgesic: Secondary | ICD-10-CM | POA: Diagnosis not present

## 2017-06-24 DIAGNOSIS — E785 Hyperlipidemia, unspecified: Secondary | ICD-10-CM | POA: Diagnosis not present

## 2017-06-24 DIAGNOSIS — F419 Anxiety disorder, unspecified: Secondary | ICD-10-CM | POA: Diagnosis not present

## 2017-06-24 DIAGNOSIS — D696 Thrombocytopenia, unspecified: Secondary | ICD-10-CM | POA: Diagnosis not present

## 2017-06-24 DIAGNOSIS — J449 Chronic obstructive pulmonary disease, unspecified: Secondary | ICD-10-CM | POA: Diagnosis not present

## 2017-06-25 DIAGNOSIS — Z431 Encounter for attention to gastrostomy: Secondary | ICD-10-CM | POA: Diagnosis not present

## 2017-06-25 DIAGNOSIS — F329 Major depressive disorder, single episode, unspecified: Secondary | ICD-10-CM | POA: Diagnosis not present

## 2017-06-25 DIAGNOSIS — S065X9D Traumatic subdural hemorrhage with loss of consciousness of unspecified duration, subsequent encounter: Secondary | ICD-10-CM | POA: Diagnosis not present

## 2017-06-25 DIAGNOSIS — Z7902 Long term (current) use of antithrombotics/antiplatelets: Secondary | ICD-10-CM | POA: Diagnosis not present

## 2017-06-25 DIAGNOSIS — F419 Anxiety disorder, unspecified: Secondary | ICD-10-CM | POA: Diagnosis not present

## 2017-06-25 DIAGNOSIS — C3490 Malignant neoplasm of unspecified part of unspecified bronchus or lung: Secondary | ICD-10-CM | POA: Diagnosis not present

## 2017-06-25 DIAGNOSIS — G40909 Epilepsy, unspecified, not intractable, without status epilepticus: Secondary | ICD-10-CM | POA: Diagnosis not present

## 2017-06-25 DIAGNOSIS — I10 Essential (primary) hypertension: Secondary | ICD-10-CM | POA: Diagnosis not present

## 2017-06-25 DIAGNOSIS — L989 Disorder of the skin and subcutaneous tissue, unspecified: Secondary | ICD-10-CM | POA: Diagnosis not present

## 2017-06-25 DIAGNOSIS — Z7951 Long term (current) use of inhaled steroids: Secondary | ICD-10-CM | POA: Diagnosis not present

## 2017-06-25 DIAGNOSIS — J449 Chronic obstructive pulmonary disease, unspecified: Secondary | ICD-10-CM | POA: Diagnosis not present

## 2017-06-25 DIAGNOSIS — E785 Hyperlipidemia, unspecified: Secondary | ICD-10-CM | POA: Diagnosis not present

## 2017-06-25 DIAGNOSIS — Z79891 Long term (current) use of opiate analgesic: Secondary | ICD-10-CM | POA: Diagnosis not present

## 2017-06-25 DIAGNOSIS — G8194 Hemiplegia, unspecified affecting left nondominant side: Secondary | ICD-10-CM | POA: Diagnosis not present

## 2017-06-25 DIAGNOSIS — G47419 Narcolepsy without cataplexy: Secondary | ICD-10-CM | POA: Diagnosis not present

## 2017-06-25 DIAGNOSIS — I2510 Atherosclerotic heart disease of native coronary artery without angina pectoris: Secondary | ICD-10-CM | POA: Diagnosis not present

## 2017-06-25 DIAGNOSIS — M109 Gout, unspecified: Secondary | ICD-10-CM | POA: Diagnosis not present

## 2017-06-25 DIAGNOSIS — D696 Thrombocytopenia, unspecified: Secondary | ICD-10-CM | POA: Diagnosis not present

## 2017-06-25 DIAGNOSIS — R1311 Dysphagia, oral phase: Secondary | ICD-10-CM | POA: Diagnosis not present

## 2017-06-26 DIAGNOSIS — G8194 Hemiplegia, unspecified affecting left nondominant side: Secondary | ICD-10-CM | POA: Diagnosis not present

## 2017-06-26 DIAGNOSIS — M109 Gout, unspecified: Secondary | ICD-10-CM | POA: Diagnosis not present

## 2017-06-26 DIAGNOSIS — F329 Major depressive disorder, single episode, unspecified: Secondary | ICD-10-CM | POA: Diagnosis not present

## 2017-06-26 DIAGNOSIS — Z7902 Long term (current) use of antithrombotics/antiplatelets: Secondary | ICD-10-CM | POA: Diagnosis not present

## 2017-06-26 DIAGNOSIS — L989 Disorder of the skin and subcutaneous tissue, unspecified: Secondary | ICD-10-CM | POA: Diagnosis not present

## 2017-06-26 DIAGNOSIS — C3490 Malignant neoplasm of unspecified part of unspecified bronchus or lung: Secondary | ICD-10-CM | POA: Diagnosis not present

## 2017-06-26 DIAGNOSIS — I2510 Atherosclerotic heart disease of native coronary artery without angina pectoris: Secondary | ICD-10-CM | POA: Diagnosis not present

## 2017-06-26 DIAGNOSIS — F419 Anxiety disorder, unspecified: Secondary | ICD-10-CM | POA: Diagnosis not present

## 2017-06-26 DIAGNOSIS — Z7951 Long term (current) use of inhaled steroids: Secondary | ICD-10-CM | POA: Diagnosis not present

## 2017-06-26 DIAGNOSIS — R1311 Dysphagia, oral phase: Secondary | ICD-10-CM | POA: Diagnosis not present

## 2017-06-26 DIAGNOSIS — Z79891 Long term (current) use of opiate analgesic: Secondary | ICD-10-CM | POA: Diagnosis not present

## 2017-06-26 DIAGNOSIS — I10 Essential (primary) hypertension: Secondary | ICD-10-CM | POA: Diagnosis not present

## 2017-06-26 DIAGNOSIS — Z431 Encounter for attention to gastrostomy: Secondary | ICD-10-CM | POA: Diagnosis not present

## 2017-06-26 DIAGNOSIS — E785 Hyperlipidemia, unspecified: Secondary | ICD-10-CM | POA: Diagnosis not present

## 2017-06-26 DIAGNOSIS — G40909 Epilepsy, unspecified, not intractable, without status epilepticus: Secondary | ICD-10-CM | POA: Diagnosis not present

## 2017-06-26 DIAGNOSIS — J449 Chronic obstructive pulmonary disease, unspecified: Secondary | ICD-10-CM | POA: Diagnosis not present

## 2017-06-26 DIAGNOSIS — G47419 Narcolepsy without cataplexy: Secondary | ICD-10-CM | POA: Diagnosis not present

## 2017-06-26 DIAGNOSIS — S065X9D Traumatic subdural hemorrhage with loss of consciousness of unspecified duration, subsequent encounter: Secondary | ICD-10-CM | POA: Diagnosis not present

## 2017-06-26 DIAGNOSIS — D696 Thrombocytopenia, unspecified: Secondary | ICD-10-CM | POA: Diagnosis not present

## 2017-06-27 ENCOUNTER — Encounter: Payer: Self-pay | Admitting: *Deleted

## 2017-06-27 ENCOUNTER — Other Ambulatory Visit: Payer: Self-pay | Admitting: *Deleted

## 2017-06-27 DIAGNOSIS — E785 Hyperlipidemia, unspecified: Secondary | ICD-10-CM | POA: Diagnosis not present

## 2017-06-27 DIAGNOSIS — R1311 Dysphagia, oral phase: Secondary | ICD-10-CM | POA: Diagnosis not present

## 2017-06-27 DIAGNOSIS — C3490 Malignant neoplasm of unspecified part of unspecified bronchus or lung: Secondary | ICD-10-CM | POA: Diagnosis not present

## 2017-06-27 DIAGNOSIS — G47419 Narcolepsy without cataplexy: Secondary | ICD-10-CM | POA: Diagnosis not present

## 2017-06-27 DIAGNOSIS — F419 Anxiety disorder, unspecified: Secondary | ICD-10-CM | POA: Diagnosis not present

## 2017-06-27 DIAGNOSIS — I2510 Atherosclerotic heart disease of native coronary artery without angina pectoris: Secondary | ICD-10-CM | POA: Diagnosis not present

## 2017-06-27 DIAGNOSIS — I10 Essential (primary) hypertension: Secondary | ICD-10-CM | POA: Diagnosis not present

## 2017-06-27 DIAGNOSIS — D696 Thrombocytopenia, unspecified: Secondary | ICD-10-CM | POA: Diagnosis not present

## 2017-06-27 DIAGNOSIS — S065X9D Traumatic subdural hemorrhage with loss of consciousness of unspecified duration, subsequent encounter: Secondary | ICD-10-CM | POA: Diagnosis not present

## 2017-06-27 DIAGNOSIS — J449 Chronic obstructive pulmonary disease, unspecified: Secondary | ICD-10-CM | POA: Diagnosis not present

## 2017-06-27 DIAGNOSIS — Z7951 Long term (current) use of inhaled steroids: Secondary | ICD-10-CM | POA: Diagnosis not present

## 2017-06-27 DIAGNOSIS — F329 Major depressive disorder, single episode, unspecified: Secondary | ICD-10-CM | POA: Diagnosis not present

## 2017-06-27 DIAGNOSIS — Z7902 Long term (current) use of antithrombotics/antiplatelets: Secondary | ICD-10-CM | POA: Diagnosis not present

## 2017-06-27 DIAGNOSIS — L989 Disorder of the skin and subcutaneous tissue, unspecified: Secondary | ICD-10-CM | POA: Diagnosis not present

## 2017-06-27 DIAGNOSIS — G40909 Epilepsy, unspecified, not intractable, without status epilepticus: Secondary | ICD-10-CM | POA: Diagnosis not present

## 2017-06-27 DIAGNOSIS — G8194 Hemiplegia, unspecified affecting left nondominant side: Secondary | ICD-10-CM | POA: Diagnosis not present

## 2017-06-27 DIAGNOSIS — Z431 Encounter for attention to gastrostomy: Secondary | ICD-10-CM | POA: Diagnosis not present

## 2017-06-27 DIAGNOSIS — Z79891 Long term (current) use of opiate analgesic: Secondary | ICD-10-CM | POA: Diagnosis not present

## 2017-06-27 DIAGNOSIS — M109 Gout, unspecified: Secondary | ICD-10-CM | POA: Diagnosis not present

## 2017-06-27 NOTE — Patient Outreach (Signed)
Referral received from insurance, pt discharged [facility] on 06/19/17 after hospitalization traumatic subdural hematoma, CHF, HTN , CAD, bradycardia, lung cancer.  Telephone call to patient, permission given to speak with wife Tessie Fass, wife agitated during conversation citing she does not understand "what this call is all about and I've got enough going on around here"  RN CM explained [facility] program to wife, wife states she has disabled daughter that lives with her and husband and has nurses daily to care for her.  Wife reports home health RN, PT, ST, CNA is seeing pt several times weekly, pt has feeding tube, unable to walk at present, has medications and was unable to afford vimpat and this has been switched to keppra for seizures, wife states pt has had no seizure activity or syncopal episodes since discharge home, pt to see primary MD 06/30/17 and wife will transport pt in the car, she is discussing transfer to car with PT today at home visit, wife states pt to see neurosurgeon 07/22/17, wife reports pt does not qualify for medicaid,  Wife refuses [facility] program at present but is receptive to having successful outreach letter with contact numbers and 24 hour nurse line magnet mailed to their home, wife states she does not need  RN CM, pharmacy or CSW stating " I"ve got everything I need here and don't need anyone else"   RN CM mailed successful outreach letter to patient's home.  RN CM faxed note to Dr. Nadara Mustard informing pt / wife refuses [facility] services.    Jacqlyn Larsen [facility] [place] Coordinator [phone]

## 2017-06-30 DIAGNOSIS — Z7902 Long term (current) use of antithrombotics/antiplatelets: Secondary | ICD-10-CM | POA: Diagnosis not present

## 2017-06-30 DIAGNOSIS — I2510 Atherosclerotic heart disease of native coronary artery without angina pectoris: Secondary | ICD-10-CM | POA: Diagnosis not present

## 2017-06-30 DIAGNOSIS — I639 Cerebral infarction, unspecified: Secondary | ICD-10-CM | POA: Diagnosis not present

## 2017-06-30 DIAGNOSIS — J449 Chronic obstructive pulmonary disease, unspecified: Secondary | ICD-10-CM | POA: Diagnosis not present

## 2017-06-30 DIAGNOSIS — Z7951 Long term (current) use of inhaled steroids: Secondary | ICD-10-CM | POA: Diagnosis not present

## 2017-06-30 DIAGNOSIS — G8194 Hemiplegia, unspecified affecting left nondominant side: Secondary | ICD-10-CM | POA: Diagnosis not present

## 2017-06-30 DIAGNOSIS — Z79891 Long term (current) use of opiate analgesic: Secondary | ICD-10-CM | POA: Diagnosis not present

## 2017-06-30 DIAGNOSIS — E785 Hyperlipidemia, unspecified: Secondary | ICD-10-CM | POA: Diagnosis not present

## 2017-06-30 DIAGNOSIS — R05 Cough: Secondary | ICD-10-CM | POA: Diagnosis not present

## 2017-06-30 DIAGNOSIS — S065X9D Traumatic subdural hemorrhage with loss of consciousness of unspecified duration, subsequent encounter: Secondary | ICD-10-CM | POA: Diagnosis not present

## 2017-06-30 DIAGNOSIS — I10 Essential (primary) hypertension: Secondary | ICD-10-CM | POA: Diagnosis not present

## 2017-06-30 DIAGNOSIS — Z431 Encounter for attention to gastrostomy: Secondary | ICD-10-CM | POA: Diagnosis not present

## 2017-06-30 DIAGNOSIS — F329 Major depressive disorder, single episode, unspecified: Secondary | ICD-10-CM | POA: Diagnosis not present

## 2017-06-30 DIAGNOSIS — M7732 Calcaneal spur, left foot: Secondary | ICD-10-CM | POA: Diagnosis not present

## 2017-06-30 DIAGNOSIS — M109 Gout, unspecified: Secondary | ICD-10-CM | POA: Diagnosis not present

## 2017-06-30 DIAGNOSIS — M19072 Primary osteoarthritis, left ankle and foot: Secondary | ICD-10-CM | POA: Diagnosis not present

## 2017-06-30 DIAGNOSIS — F419 Anxiety disorder, unspecified: Secondary | ICD-10-CM | POA: Diagnosis not present

## 2017-06-30 DIAGNOSIS — G47419 Narcolepsy without cataplexy: Secondary | ICD-10-CM | POA: Diagnosis not present

## 2017-06-30 DIAGNOSIS — G40909 Epilepsy, unspecified, not intractable, without status epilepticus: Secondary | ICD-10-CM | POA: Diagnosis not present

## 2017-06-30 DIAGNOSIS — I700 Atherosclerosis of aorta: Secondary | ICD-10-CM | POA: Diagnosis not present

## 2017-06-30 DIAGNOSIS — R1311 Dysphagia, oral phase: Secondary | ICD-10-CM | POA: Diagnosis not present

## 2017-06-30 DIAGNOSIS — L989 Disorder of the skin and subcutaneous tissue, unspecified: Secondary | ICD-10-CM | POA: Diagnosis not present

## 2017-06-30 DIAGNOSIS — C3490 Malignant neoplasm of unspecified part of unspecified bronchus or lung: Secondary | ICD-10-CM | POA: Diagnosis not present

## 2017-06-30 DIAGNOSIS — D696 Thrombocytopenia, unspecified: Secondary | ICD-10-CM | POA: Diagnosis not present

## 2017-07-01 DIAGNOSIS — J449 Chronic obstructive pulmonary disease, unspecified: Secondary | ICD-10-CM | POA: Diagnosis not present

## 2017-07-01 DIAGNOSIS — I2510 Atherosclerotic heart disease of native coronary artery without angina pectoris: Secondary | ICD-10-CM | POA: Diagnosis not present

## 2017-07-01 DIAGNOSIS — G40909 Epilepsy, unspecified, not intractable, without status epilepticus: Secondary | ICD-10-CM | POA: Diagnosis not present

## 2017-07-01 DIAGNOSIS — Z7902 Long term (current) use of antithrombotics/antiplatelets: Secondary | ICD-10-CM | POA: Diagnosis not present

## 2017-07-01 DIAGNOSIS — C3490 Malignant neoplasm of unspecified part of unspecified bronchus or lung: Secondary | ICD-10-CM | POA: Diagnosis not present

## 2017-07-01 DIAGNOSIS — Z79891 Long term (current) use of opiate analgesic: Secondary | ICD-10-CM | POA: Diagnosis not present

## 2017-07-01 DIAGNOSIS — L989 Disorder of the skin and subcutaneous tissue, unspecified: Secondary | ICD-10-CM | POA: Diagnosis not present

## 2017-07-01 DIAGNOSIS — Z7951 Long term (current) use of inhaled steroids: Secondary | ICD-10-CM | POA: Diagnosis not present

## 2017-07-01 DIAGNOSIS — E785 Hyperlipidemia, unspecified: Secondary | ICD-10-CM | POA: Diagnosis not present

## 2017-07-01 DIAGNOSIS — M109 Gout, unspecified: Secondary | ICD-10-CM | POA: Diagnosis not present

## 2017-07-01 DIAGNOSIS — D696 Thrombocytopenia, unspecified: Secondary | ICD-10-CM | POA: Diagnosis not present

## 2017-07-01 DIAGNOSIS — R1311 Dysphagia, oral phase: Secondary | ICD-10-CM | POA: Diagnosis not present

## 2017-07-01 DIAGNOSIS — Z431 Encounter for attention to gastrostomy: Secondary | ICD-10-CM | POA: Diagnosis not present

## 2017-07-01 DIAGNOSIS — F419 Anxiety disorder, unspecified: Secondary | ICD-10-CM | POA: Diagnosis not present

## 2017-07-01 DIAGNOSIS — S065X9D Traumatic subdural hemorrhage with loss of consciousness of unspecified duration, subsequent encounter: Secondary | ICD-10-CM | POA: Diagnosis not present

## 2017-07-01 DIAGNOSIS — G47419 Narcolepsy without cataplexy: Secondary | ICD-10-CM | POA: Diagnosis not present

## 2017-07-01 DIAGNOSIS — I10 Essential (primary) hypertension: Secondary | ICD-10-CM | POA: Diagnosis not present

## 2017-07-01 DIAGNOSIS — F329 Major depressive disorder, single episode, unspecified: Secondary | ICD-10-CM | POA: Diagnosis not present

## 2017-07-01 DIAGNOSIS — G8194 Hemiplegia, unspecified affecting left nondominant side: Secondary | ICD-10-CM | POA: Diagnosis not present

## 2017-07-04 ENCOUNTER — Telehealth: Payer: Self-pay

## 2017-07-04 DIAGNOSIS — Z431 Encounter for attention to gastrostomy: Secondary | ICD-10-CM | POA: Diagnosis not present

## 2017-07-04 DIAGNOSIS — R1311 Dysphagia, oral phase: Secondary | ICD-10-CM | POA: Diagnosis not present

## 2017-07-04 DIAGNOSIS — F329 Major depressive disorder, single episode, unspecified: Secondary | ICD-10-CM | POA: Diagnosis not present

## 2017-07-04 DIAGNOSIS — L989 Disorder of the skin and subcutaneous tissue, unspecified: Secondary | ICD-10-CM | POA: Diagnosis not present

## 2017-07-04 DIAGNOSIS — J449 Chronic obstructive pulmonary disease, unspecified: Secondary | ICD-10-CM | POA: Diagnosis not present

## 2017-07-04 DIAGNOSIS — G47419 Narcolepsy without cataplexy: Secondary | ICD-10-CM | POA: Diagnosis not present

## 2017-07-04 DIAGNOSIS — I10 Essential (primary) hypertension: Secondary | ICD-10-CM | POA: Diagnosis not present

## 2017-07-04 DIAGNOSIS — Z7951 Long term (current) use of inhaled steroids: Secondary | ICD-10-CM | POA: Diagnosis not present

## 2017-07-04 DIAGNOSIS — D696 Thrombocytopenia, unspecified: Secondary | ICD-10-CM | POA: Diagnosis not present

## 2017-07-04 DIAGNOSIS — C3490 Malignant neoplasm of unspecified part of unspecified bronchus or lung: Secondary | ICD-10-CM | POA: Diagnosis not present

## 2017-07-04 DIAGNOSIS — I2510 Atherosclerotic heart disease of native coronary artery without angina pectoris: Secondary | ICD-10-CM | POA: Diagnosis not present

## 2017-07-04 DIAGNOSIS — M109 Gout, unspecified: Secondary | ICD-10-CM | POA: Diagnosis not present

## 2017-07-04 DIAGNOSIS — F419 Anxiety disorder, unspecified: Secondary | ICD-10-CM | POA: Diagnosis not present

## 2017-07-04 DIAGNOSIS — Z7902 Long term (current) use of antithrombotics/antiplatelets: Secondary | ICD-10-CM | POA: Diagnosis not present

## 2017-07-04 DIAGNOSIS — S065X9D Traumatic subdural hemorrhage with loss of consciousness of unspecified duration, subsequent encounter: Secondary | ICD-10-CM | POA: Diagnosis not present

## 2017-07-04 DIAGNOSIS — E785 Hyperlipidemia, unspecified: Secondary | ICD-10-CM | POA: Diagnosis not present

## 2017-07-04 DIAGNOSIS — G8194 Hemiplegia, unspecified affecting left nondominant side: Secondary | ICD-10-CM | POA: Diagnosis not present

## 2017-07-04 DIAGNOSIS — G40909 Epilepsy, unspecified, not intractable, without status epilepticus: Secondary | ICD-10-CM | POA: Diagnosis not present

## 2017-07-04 DIAGNOSIS — Z79891 Long term (current) use of opiate analgesic: Secondary | ICD-10-CM | POA: Diagnosis not present

## 2017-07-04 MED ORDER — ASPIRIN EC 81 MG PO TBEC: 81.0000 mg | DELAYED_RELEASE_TABLET | Freq: Every day | ORAL | 3 refills | Status: AC

## 2017-07-04 NOTE — Telephone Encounter (Signed)
From office visit from 04/03/17 , per Dr Bronson Ing wants patient to stop Plavix and begin ASA 81 mg daily mid September, wife notified

## 2017-07-07 DIAGNOSIS — M109 Gout, unspecified: Secondary | ICD-10-CM | POA: Diagnosis not present

## 2017-07-07 DIAGNOSIS — I2510 Atherosclerotic heart disease of native coronary artery without angina pectoris: Secondary | ICD-10-CM | POA: Diagnosis not present

## 2017-07-07 DIAGNOSIS — Z7902 Long term (current) use of antithrombotics/antiplatelets: Secondary | ICD-10-CM | POA: Diagnosis not present

## 2017-07-07 DIAGNOSIS — R1311 Dysphagia, oral phase: Secondary | ICD-10-CM | POA: Diagnosis not present

## 2017-07-07 DIAGNOSIS — J449 Chronic obstructive pulmonary disease, unspecified: Secondary | ICD-10-CM | POA: Diagnosis not present

## 2017-07-07 DIAGNOSIS — L989 Disorder of the skin and subcutaneous tissue, unspecified: Secondary | ICD-10-CM | POA: Diagnosis not present

## 2017-07-07 DIAGNOSIS — F419 Anxiety disorder, unspecified: Secondary | ICD-10-CM | POA: Diagnosis not present

## 2017-07-07 DIAGNOSIS — G8194 Hemiplegia, unspecified affecting left nondominant side: Secondary | ICD-10-CM | POA: Diagnosis not present

## 2017-07-07 DIAGNOSIS — G40909 Epilepsy, unspecified, not intractable, without status epilepticus: Secondary | ICD-10-CM | POA: Diagnosis not present

## 2017-07-07 DIAGNOSIS — S065X9D Traumatic subdural hemorrhage with loss of consciousness of unspecified duration, subsequent encounter: Secondary | ICD-10-CM | POA: Diagnosis not present

## 2017-07-07 DIAGNOSIS — Z431 Encounter for attention to gastrostomy: Secondary | ICD-10-CM | POA: Diagnosis not present

## 2017-07-07 DIAGNOSIS — I10 Essential (primary) hypertension: Secondary | ICD-10-CM | POA: Diagnosis not present

## 2017-07-07 DIAGNOSIS — F329 Major depressive disorder, single episode, unspecified: Secondary | ICD-10-CM | POA: Diagnosis not present

## 2017-07-07 DIAGNOSIS — C3490 Malignant neoplasm of unspecified part of unspecified bronchus or lung: Secondary | ICD-10-CM | POA: Diagnosis not present

## 2017-07-07 DIAGNOSIS — E785 Hyperlipidemia, unspecified: Secondary | ICD-10-CM | POA: Diagnosis not present

## 2017-07-07 DIAGNOSIS — C801 Malignant (primary) neoplasm, unspecified: Secondary | ICD-10-CM | POA: Diagnosis not present

## 2017-07-07 DIAGNOSIS — R918 Other nonspecific abnormal finding of lung field: Secondary | ICD-10-CM | POA: Diagnosis not present

## 2017-07-07 DIAGNOSIS — D696 Thrombocytopenia, unspecified: Secondary | ICD-10-CM | POA: Diagnosis not present

## 2017-07-07 DIAGNOSIS — Z79891 Long term (current) use of opiate analgesic: Secondary | ICD-10-CM | POA: Diagnosis not present

## 2017-07-07 DIAGNOSIS — Z7951 Long term (current) use of inhaled steroids: Secondary | ICD-10-CM | POA: Diagnosis not present

## 2017-07-07 DIAGNOSIS — G47419 Narcolepsy without cataplexy: Secondary | ICD-10-CM | POA: Diagnosis not present

## 2017-07-09 ENCOUNTER — Ambulatory Visit (INDEPENDENT_AMBULATORY_CARE_PROVIDER_SITE_OTHER): Payer: PPO | Admitting: Cardiovascular Disease

## 2017-07-09 ENCOUNTER — Encounter: Payer: Self-pay | Admitting: Cardiovascular Disease

## 2017-07-09 VITALS — BP 118/62 | HR 65 | Ht 71.0 in | Wt 164.0 lb

## 2017-07-09 DIAGNOSIS — I10 Essential (primary) hypertension: Secondary | ICD-10-CM | POA: Diagnosis not present

## 2017-07-09 DIAGNOSIS — S065XAA Traumatic subdural hemorrhage with loss of consciousness status unknown, initial encounter: Secondary | ICD-10-CM

## 2017-07-09 DIAGNOSIS — I5022 Chronic systolic (congestive) heart failure: Secondary | ICD-10-CM | POA: Diagnosis not present

## 2017-07-09 DIAGNOSIS — Z9289 Personal history of other medical treatment: Secondary | ICD-10-CM | POA: Diagnosis not present

## 2017-07-09 DIAGNOSIS — R55 Syncope and collapse: Secondary | ICD-10-CM

## 2017-07-09 DIAGNOSIS — I6200 Nontraumatic subdural hemorrhage, unspecified: Secondary | ICD-10-CM | POA: Diagnosis not present

## 2017-07-09 DIAGNOSIS — S065X9A Traumatic subdural hemorrhage with loss of consciousness of unspecified duration, initial encounter: Secondary | ICD-10-CM

## 2017-07-09 DIAGNOSIS — I2510 Atherosclerotic heart disease of native coronary artery without angina pectoris: Secondary | ICD-10-CM

## 2017-07-09 DIAGNOSIS — R001 Bradycardia, unspecified: Secondary | ICD-10-CM

## 2017-07-09 NOTE — Patient Instructions (Signed)
Medication Instructions:  Continue all current medications.  Labwork: none  Testing/Procedures: none  Follow-Up: 3 months   Any Other Special Instructions Will Be Listed Below (If Applicable).  If you need a refill on your cardiac medications before your next appointment, please call your pharmacy.  

## 2017-07-09 NOTE — Progress Notes (Signed)
SUBJECTIVE: The patient presents for follow-up of chronic systolic heart failure and coronary artery disease. He had some sinus bradycardia with event monitoring and is being followed by Dr. Lovena Le.  Unfortunately, much as happened since his initial office visit with me in mid June.  I reviewed several electronic databases. He was hospitalized for congestive heart failure. He was then hospitalized at [facility] due to enlarging right subdural hematoma for which he underwent burr holes in the right frontal and right parietal regions. He developed seizures and was started on Keppra. They also placed a PEG tube.  He has lost a considerable amount of weight. He denies chest pain. He has no leg swelling.  Echocardiogram on 04/07/17 demonstrated moderately reduced left intrinsic oh systolic function, LVEF 45-40%.   Review of Systems: As per "subjective", otherwise negative.  No Known Allergies  Current Outpatient Prescriptions  Medication Sig Dispense Refill  . albuterol (PROVENTIL HFA;VENTOLIN HFA) 108 (90 Base) MCG/ACT inhaler Inhale 1-2 puffs into the lungs every 6 (six) hours as needed for wheezing or shortness of breath.    Marland Kitchen albuterol (PROVENTIL) (5 MG/ML) 0.5% nebulizer solution Take 2.5 mg by nebulization every 6 (six) hours as needed for wheezing or shortness of breath.    . allopurinol (ZYLOPRIM) 300 MG tablet Take 300 mg by mouth daily.  3  . ALPRAZolam (XANAX) 0.5 MG tablet Take 1.5 mg by mouth at bedtime.   5  . aspirin EC 81 MG tablet Take 1 tablet (81 mg total) by mouth daily. 90 tablet 3  . beta carotene 25000 UNIT capsule Take by mouth.    . budesonide (PULMICORT) 0.5 MG/2ML nebulizer solution Take 0.5 mg by nebulization 2 (two) times daily.    . calcium carbonate (OSCAL) 1500 (600 Ca) MG TABS tablet Take 600 mg of elemental calcium by mouth 2 (two) times daily with a meal.    . colchicine 0.6 MG tablet Take 0.6 mg by mouth 2 (two) times daily.  12  . FLUoxetine (PROZAC)  20 MG capsule Take 20 mg by mouth daily.    . furosemide (LASIX) 80 MG tablet Take 1 tablet (80 mg total) by mouth daily. 90 tablet 3  . HYDROcodone-acetaminophen (NORCO/VICODIN) 5-325 MG tablet Take 1 tablet by mouth every 6 (six) hours as needed for moderate pain.    Marland Kitchen levETIRAcetam (KEPPRA) 500 MG tablet Take 500 mg by mouth 2 (two) times daily.    Marland Kitchen levothyroxine (SYNTHROID, LEVOTHROID) 50 MCG tablet Take 50 mcg by mouth every morning.  3  . lisinopril (PRINIVIL,ZESTRIL) 5 MG tablet Take 5 mg by mouth daily.    . magic mouthwash SOLN TAKE ONE TEASPOONFUL (5ML) BY MOUTH FOUR TIMES DAILY FOR SORES IN MOUTH FROM CHEMOTHEROPY AS NEEDED  99  . Multiple Vitamin (MULTIVITAMIN WITH MINERALS) TABS tablet Take 1 tablet by mouth daily.    . nitroGLYCERIN (NITROSTAT) 0.4 MG SL tablet Place 1 tablet (0.4 mg total) under the tongue every 5 (five) minutes as needed for chest pain. 50 tablet 0  . nystatin (MYCOSTATIN) 100000 UNIT/ML suspension Take 5 mLs (500,000 Units total) by mouth 3 (three) times daily. 60 mL 0  . potassium chloride SA (K-DUR,KLOR-CON) 20 MEQ tablet Take 2 tablets (40 mEq total) by mouth daily. 180 tablet 3  . simvastatin (ZOCOR) 40 MG tablet Take 40 mg by mouth at bedtime.  3  . tiotropium (SPIRIVA) 18 MCG inhalation capsule Place 18 mcg into inhaler and inhale at bedtime.  No current facility-administered medications for this visit.     Past Medical History:  Diagnosis Date  . Anginal pain ([place]) 1997  . Anxiety   . Arthritis    "hands, knees, back" (03/24/2017)  . CAD in native artery    a. s/p CABG 1997.  Marland Kitchen CHF (congestive heart failure) ([place])   . CKD (chronic kidney disease), stage III    by labs  . COPD (chronic obstructive pulmonary disease) ([place])   . Depression   . Gout   . Hyperlipidemia   . Hypertension   . Hypothyroidism   . Lipoma    "left thigh; never treated; it's a fatty tumor" (03/24/2017)  . Memory loss   . Mild mitral regurgitation   . Myocardial  infarction ([place]) 1997  . Panic attack   . Peripheral vascular disease, unspecified ([place])   . Pneumonia 02/2017  . Small cell lung cancer [facility]) 2014   diagnosed 2014 with radiation and chemotherapy  . Squamous cell carcinoma, leg    "right side; it was not basal" (03/24/2017)  . Systolic murmur     Past Surgical History:  Procedure Laterality Date  . APPENDECTOMY    . BRONCHIAL WASHINGS Left 2014   "then took biopsies"  . CARDIAC CATHETERIZATION  ?1997  . COLONOSCOPY    . COLONOSCOPY N/A 05/09/2016   Procedure: COLONOSCOPY;  Surgeon: Rogene Houston, MD;  Location: [facility] SU[facility];  Service: Endoscopy;  Laterality: N/A;  1030  . CORONARY ARTERY BYPASS GRAFT  1997   "CABG X4"  . CORONARY STENT INTERVENTION N/A 03/27/2017   Procedure: Coronary Stent Intervention;  Surgeon: Leonie Man, MD;  Location: [place] CV LAB;  Service: Cardiovascular;  Laterality: N/A;  . HEMORRHOIDECTOMY WITH HEMORRHOID BANDING  1970s  . HERNIA REPAIR    . LEFT HEART CATH AND CORS/GRAFTS ANGIOGRAPHY N/A 03/27/2017   Procedure: Left Heart Cath and Cors/Grafts Angiography;  Surgeon: Leonie Man, MD;  Location: [place] CV LAB;  Service: Cardiovascular;  Laterality: N/A;  . SQUAMOUS CELL CARCINOMA EXCISION Right    leg  . TONSILLECTOMY    . UMBILICAL HERNIA REPAIR  1985    Social History   Social History  . Marital status: Married    Spouse name: N/A  . Number of children: N/A  . Years of education: N/A   Occupational History  . Not on file.   Social History Main Topics  . Smoking status: Former Smoker    Packs/day: 3.00    Years: 50.00    Types: Cigarettes    Quit date: 05/09/2012  . Smokeless tobacco: Never Used     Comment: 03/24/2017 "used e-cigarettes for a short time when trying to quit regular cigarettes"  . Alcohol use No  . Drug use: No  . Sexual activity: Not Currently   Other Topics Concern  . Not on file   Social History Narrative  . No narrative on file     Vitals:     07/09/17 1334  BP: 118/62  Pulse: 65  SpO2: 96%  Weight: 164 lb (74.4 kg)  Height: 5\' 11"  (1.803 m)    Wt Readings from Last 3 Encounters:  07/09/17 164 lb (74.4 kg)  05/09/17 172 lb (78 kg)  05/01/17 174 lb (78.9 kg)     PHYSICAL EXAM General: NAD, chronically ill appearing HEENT: Normal. Neck: No JVD, no thyromegaly. Lungs: Bilateral rhonchi CV: Regular rate and rhythm, normal S1/S2, no S3/S4, no murmur. No pretibial or periankle edema.  Abdomen: No distention.  Neurologic: Alert and oriented.  Psych: Flat affect. Skin: Normal.    ECG: Most recent ECG reviewed.   Labs: Lab Results  Component Value Date/Time   K 3.6 04/16/2017 02:54 PM   BUN 17 04/16/2017 02:54 PM   CREATININE 1.33 (H) 04/16/2017 02:54 PM   ALT 18 04/12/2017 05:18 AM   TSH 1.587 04/11/2017 01:27 PM   HGB 11.1 (L) 04/12/2017 05:18 AM     Lipids: Lab Results  Component Value Date/Time   LDLCALC 71 03/28/2017 10:00 AM   CHOL 121 03/28/2017 10:00 AM   TRIG 98 03/28/2017 10:00 AM   HDL 30 (L) 03/28/2017 10:00 AM       ASSESSMENT AND PLAN:  1. Coronary artery disease with CABG and non-STEMI and PCI: Symptomatically stable.  Continue aspirin, lisinopril, and simvastatin. No beta blockers due to bradycardia and junctional rhythm.  2. Hypertension: Controlled. No changes.  3. Syncope: No sinus pauses seen with event monitoring. He had junctional rhythm when hospitalized for CHF. Not on beta blockers. Being followed by EP.  4. Chronic systolic heart failure: Euvolemic. Weight down 12 pounds from July 20. Continue current diuretic regimen with Lasix.     Disposition: Follow up 3 months.  Time spent: 40 minutes, of which greater than 50% was spent reviewing symptoms, relevant blood tests and studies, and discussing management plan with the patient.    Kate Sable, M.D., F.A.C.C.

## 2017-07-10 DIAGNOSIS — C801 Malignant (primary) neoplasm, unspecified: Secondary | ICD-10-CM | POA: Diagnosis not present

## 2017-07-10 DIAGNOSIS — C3490 Malignant neoplasm of unspecified part of unspecified bronchus or lung: Secondary | ICD-10-CM | POA: Diagnosis not present

## 2017-07-15 DIAGNOSIS — G40909 Epilepsy, unspecified, not intractable, without status epilepticus: Secondary | ICD-10-CM | POA: Diagnosis not present

## 2017-07-15 DIAGNOSIS — S065X9D Traumatic subdural hemorrhage with loss of consciousness of unspecified duration, subsequent encounter: Secondary | ICD-10-CM | POA: Diagnosis not present

## 2017-07-15 DIAGNOSIS — Z7951 Long term (current) use of inhaled steroids: Secondary | ICD-10-CM | POA: Diagnosis not present

## 2017-07-15 DIAGNOSIS — G47419 Narcolepsy without cataplexy: Secondary | ICD-10-CM | POA: Diagnosis not present

## 2017-07-15 DIAGNOSIS — J449 Chronic obstructive pulmonary disease, unspecified: Secondary | ICD-10-CM | POA: Diagnosis not present

## 2017-07-15 DIAGNOSIS — I2510 Atherosclerotic heart disease of native coronary artery without angina pectoris: Secondary | ICD-10-CM | POA: Diagnosis not present

## 2017-07-15 DIAGNOSIS — G8194 Hemiplegia, unspecified affecting left nondominant side: Secondary | ICD-10-CM | POA: Diagnosis not present

## 2017-07-15 DIAGNOSIS — Z79891 Long term (current) use of opiate analgesic: Secondary | ICD-10-CM | POA: Diagnosis not present

## 2017-07-15 DIAGNOSIS — I10 Essential (primary) hypertension: Secondary | ICD-10-CM | POA: Diagnosis not present

## 2017-07-15 DIAGNOSIS — Z431 Encounter for attention to gastrostomy: Secondary | ICD-10-CM | POA: Diagnosis not present

## 2017-07-15 DIAGNOSIS — M109 Gout, unspecified: Secondary | ICD-10-CM | POA: Diagnosis not present

## 2017-07-15 DIAGNOSIS — D696 Thrombocytopenia, unspecified: Secondary | ICD-10-CM | POA: Diagnosis not present

## 2017-07-15 DIAGNOSIS — F329 Major depressive disorder, single episode, unspecified: Secondary | ICD-10-CM | POA: Diagnosis not present

## 2017-07-15 DIAGNOSIS — L989 Disorder of the skin and subcutaneous tissue, unspecified: Secondary | ICD-10-CM | POA: Diagnosis not present

## 2017-07-15 DIAGNOSIS — E785 Hyperlipidemia, unspecified: Secondary | ICD-10-CM | POA: Diagnosis not present

## 2017-07-15 DIAGNOSIS — F419 Anxiety disorder, unspecified: Secondary | ICD-10-CM | POA: Diagnosis not present

## 2017-07-15 DIAGNOSIS — R1311 Dysphagia, oral phase: Secondary | ICD-10-CM | POA: Diagnosis not present

## 2017-07-15 DIAGNOSIS — C3490 Malignant neoplasm of unspecified part of unspecified bronchus or lung: Secondary | ICD-10-CM | POA: Diagnosis not present

## 2017-07-15 DIAGNOSIS — Z7902 Long term (current) use of antithrombotics/antiplatelets: Secondary | ICD-10-CM | POA: Diagnosis not present

## 2017-07-16 DIAGNOSIS — L989 Disorder of the skin and subcutaneous tissue, unspecified: Secondary | ICD-10-CM | POA: Diagnosis not present

## 2017-07-16 DIAGNOSIS — J449 Chronic obstructive pulmonary disease, unspecified: Secondary | ICD-10-CM | POA: Diagnosis not present

## 2017-07-16 DIAGNOSIS — Z79891 Long term (current) use of opiate analgesic: Secondary | ICD-10-CM | POA: Diagnosis not present

## 2017-07-16 DIAGNOSIS — Z7951 Long term (current) use of inhaled steroids: Secondary | ICD-10-CM | POA: Diagnosis not present

## 2017-07-16 DIAGNOSIS — Z7902 Long term (current) use of antithrombotics/antiplatelets: Secondary | ICD-10-CM | POA: Diagnosis not present

## 2017-07-16 DIAGNOSIS — F419 Anxiety disorder, unspecified: Secondary | ICD-10-CM | POA: Diagnosis not present

## 2017-07-16 DIAGNOSIS — S065X9D Traumatic subdural hemorrhage with loss of consciousness of unspecified duration, subsequent encounter: Secondary | ICD-10-CM | POA: Diagnosis not present

## 2017-07-16 DIAGNOSIS — R1311 Dysphagia, oral phase: Secondary | ICD-10-CM | POA: Diagnosis not present

## 2017-07-16 DIAGNOSIS — I2510 Atherosclerotic heart disease of native coronary artery without angina pectoris: Secondary | ICD-10-CM | POA: Diagnosis not present

## 2017-07-16 DIAGNOSIS — G8194 Hemiplegia, unspecified affecting left nondominant side: Secondary | ICD-10-CM | POA: Diagnosis not present

## 2017-07-16 DIAGNOSIS — I10 Essential (primary) hypertension: Secondary | ICD-10-CM | POA: Diagnosis not present

## 2017-07-16 DIAGNOSIS — G47419 Narcolepsy without cataplexy: Secondary | ICD-10-CM | POA: Diagnosis not present

## 2017-07-16 DIAGNOSIS — Z431 Encounter for attention to gastrostomy: Secondary | ICD-10-CM | POA: Diagnosis not present

## 2017-07-16 DIAGNOSIS — M109 Gout, unspecified: Secondary | ICD-10-CM | POA: Diagnosis not present

## 2017-07-16 DIAGNOSIS — G40909 Epilepsy, unspecified, not intractable, without status epilepticus: Secondary | ICD-10-CM | POA: Diagnosis not present

## 2017-07-16 DIAGNOSIS — F329 Major depressive disorder, single episode, unspecified: Secondary | ICD-10-CM | POA: Diagnosis not present

## 2017-07-16 DIAGNOSIS — E785 Hyperlipidemia, unspecified: Secondary | ICD-10-CM | POA: Diagnosis not present

## 2017-07-16 DIAGNOSIS — D696 Thrombocytopenia, unspecified: Secondary | ICD-10-CM | POA: Diagnosis not present

## 2017-07-16 DIAGNOSIS — C3490 Malignant neoplasm of unspecified part of unspecified bronchus or lung: Secondary | ICD-10-CM | POA: Diagnosis not present

## 2017-07-17 DIAGNOSIS — D696 Thrombocytopenia, unspecified: Secondary | ICD-10-CM | POA: Diagnosis not present

## 2017-07-17 DIAGNOSIS — L989 Disorder of the skin and subcutaneous tissue, unspecified: Secondary | ICD-10-CM | POA: Diagnosis not present

## 2017-07-17 DIAGNOSIS — J449 Chronic obstructive pulmonary disease, unspecified: Secondary | ICD-10-CM | POA: Diagnosis not present

## 2017-07-17 DIAGNOSIS — E785 Hyperlipidemia, unspecified: Secondary | ICD-10-CM | POA: Diagnosis not present

## 2017-07-17 DIAGNOSIS — Z7902 Long term (current) use of antithrombotics/antiplatelets: Secondary | ICD-10-CM | POA: Diagnosis not present

## 2017-07-17 DIAGNOSIS — S065X9D Traumatic subdural hemorrhage with loss of consciousness of unspecified duration, subsequent encounter: Secondary | ICD-10-CM | POA: Diagnosis not present

## 2017-07-17 DIAGNOSIS — F329 Major depressive disorder, single episode, unspecified: Secondary | ICD-10-CM | POA: Diagnosis not present

## 2017-07-17 DIAGNOSIS — R1311 Dysphagia, oral phase: Secondary | ICD-10-CM | POA: Diagnosis not present

## 2017-07-17 DIAGNOSIS — F419 Anxiety disorder, unspecified: Secondary | ICD-10-CM | POA: Diagnosis not present

## 2017-07-17 DIAGNOSIS — G8194 Hemiplegia, unspecified affecting left nondominant side: Secondary | ICD-10-CM | POA: Diagnosis not present

## 2017-07-17 DIAGNOSIS — C3490 Malignant neoplasm of unspecified part of unspecified bronchus or lung: Secondary | ICD-10-CM | POA: Diagnosis not present

## 2017-07-17 DIAGNOSIS — G47419 Narcolepsy without cataplexy: Secondary | ICD-10-CM | POA: Diagnosis not present

## 2017-07-17 DIAGNOSIS — Z79891 Long term (current) use of opiate analgesic: Secondary | ICD-10-CM | POA: Diagnosis not present

## 2017-07-17 DIAGNOSIS — M109 Gout, unspecified: Secondary | ICD-10-CM | POA: Diagnosis not present

## 2017-07-17 DIAGNOSIS — I10 Essential (primary) hypertension: Secondary | ICD-10-CM | POA: Diagnosis not present

## 2017-07-17 DIAGNOSIS — I2510 Atherosclerotic heart disease of native coronary artery without angina pectoris: Secondary | ICD-10-CM | POA: Diagnosis not present

## 2017-07-17 DIAGNOSIS — Z7951 Long term (current) use of inhaled steroids: Secondary | ICD-10-CM | POA: Diagnosis not present

## 2017-07-17 DIAGNOSIS — G40909 Epilepsy, unspecified, not intractable, without status epilepticus: Secondary | ICD-10-CM | POA: Diagnosis not present

## 2017-07-17 DIAGNOSIS — Z431 Encounter for attention to gastrostomy: Secondary | ICD-10-CM | POA: Diagnosis not present

## 2017-07-19 DIAGNOSIS — I639 Cerebral infarction, unspecified: Secondary | ICD-10-CM | POA: Diagnosis not present

## 2017-07-19 DIAGNOSIS — I10 Essential (primary) hypertension: Secondary | ICD-10-CM | POA: Diagnosis not present

## 2017-07-19 DIAGNOSIS — R05 Cough: Secondary | ICD-10-CM | POA: Diagnosis not present

## 2017-07-19 DIAGNOSIS — C3490 Malignant neoplasm of unspecified part of unspecified bronchus or lung: Secondary | ICD-10-CM | POA: Diagnosis not present

## 2017-07-21 DIAGNOSIS — L989 Disorder of the skin and subcutaneous tissue, unspecified: Secondary | ICD-10-CM | POA: Diagnosis not present

## 2017-07-21 DIAGNOSIS — F419 Anxiety disorder, unspecified: Secondary | ICD-10-CM | POA: Diagnosis not present

## 2017-07-21 DIAGNOSIS — J449 Chronic obstructive pulmonary disease, unspecified: Secondary | ICD-10-CM | POA: Diagnosis not present

## 2017-07-21 DIAGNOSIS — Z7951 Long term (current) use of inhaled steroids: Secondary | ICD-10-CM | POA: Diagnosis not present

## 2017-07-21 DIAGNOSIS — E785 Hyperlipidemia, unspecified: Secondary | ICD-10-CM | POA: Diagnosis not present

## 2017-07-21 DIAGNOSIS — F329 Major depressive disorder, single episode, unspecified: Secondary | ICD-10-CM | POA: Diagnosis not present

## 2017-07-21 DIAGNOSIS — Z7902 Long term (current) use of antithrombotics/antiplatelets: Secondary | ICD-10-CM | POA: Diagnosis not present

## 2017-07-21 DIAGNOSIS — I10 Essential (primary) hypertension: Secondary | ICD-10-CM | POA: Diagnosis not present

## 2017-07-21 DIAGNOSIS — Z431 Encounter for attention to gastrostomy: Secondary | ICD-10-CM | POA: Diagnosis not present

## 2017-07-21 DIAGNOSIS — G47419 Narcolepsy without cataplexy: Secondary | ICD-10-CM | POA: Diagnosis not present

## 2017-07-21 DIAGNOSIS — G8194 Hemiplegia, unspecified affecting left nondominant side: Secondary | ICD-10-CM | POA: Diagnosis not present

## 2017-07-21 DIAGNOSIS — M109 Gout, unspecified: Secondary | ICD-10-CM | POA: Diagnosis not present

## 2017-07-21 DIAGNOSIS — Z79891 Long term (current) use of opiate analgesic: Secondary | ICD-10-CM | POA: Diagnosis not present

## 2017-07-21 DIAGNOSIS — D696 Thrombocytopenia, unspecified: Secondary | ICD-10-CM | POA: Diagnosis not present

## 2017-07-21 DIAGNOSIS — S065X9D Traumatic subdural hemorrhage with loss of consciousness of unspecified duration, subsequent encounter: Secondary | ICD-10-CM | POA: Diagnosis not present

## 2017-07-21 DIAGNOSIS — C3490 Malignant neoplasm of unspecified part of unspecified bronchus or lung: Secondary | ICD-10-CM | POA: Diagnosis not present

## 2017-07-21 DIAGNOSIS — R1311 Dysphagia, oral phase: Secondary | ICD-10-CM | POA: Diagnosis not present

## 2017-07-21 DIAGNOSIS — I2510 Atherosclerotic heart disease of native coronary artery without angina pectoris: Secondary | ICD-10-CM | POA: Diagnosis not present

## 2017-07-21 DIAGNOSIS — G40909 Epilepsy, unspecified, not intractable, without status epilepticus: Secondary | ICD-10-CM | POA: Diagnosis not present

## 2017-07-23 DIAGNOSIS — I10 Essential (primary) hypertension: Secondary | ICD-10-CM | POA: Diagnosis not present

## 2017-07-23 DIAGNOSIS — I2510 Atherosclerotic heart disease of native coronary artery without angina pectoris: Secondary | ICD-10-CM | POA: Diagnosis not present

## 2017-07-23 DIAGNOSIS — R1311 Dysphagia, oral phase: Secondary | ICD-10-CM | POA: Diagnosis not present

## 2017-07-23 DIAGNOSIS — F419 Anxiety disorder, unspecified: Secondary | ICD-10-CM | POA: Diagnosis not present

## 2017-07-23 DIAGNOSIS — J449 Chronic obstructive pulmonary disease, unspecified: Secondary | ICD-10-CM | POA: Diagnosis not present

## 2017-07-23 DIAGNOSIS — D696 Thrombocytopenia, unspecified: Secondary | ICD-10-CM | POA: Diagnosis not present

## 2017-07-23 DIAGNOSIS — G40909 Epilepsy, unspecified, not intractable, without status epilepticus: Secondary | ICD-10-CM | POA: Diagnosis not present

## 2017-07-23 DIAGNOSIS — Z7902 Long term (current) use of antithrombotics/antiplatelets: Secondary | ICD-10-CM | POA: Diagnosis not present

## 2017-07-23 DIAGNOSIS — S065X9D Traumatic subdural hemorrhage with loss of consciousness of unspecified duration, subsequent encounter: Secondary | ICD-10-CM | POA: Diagnosis not present

## 2017-07-23 DIAGNOSIS — C3490 Malignant neoplasm of unspecified part of unspecified bronchus or lung: Secondary | ICD-10-CM | POA: Diagnosis not present

## 2017-07-23 DIAGNOSIS — Z79891 Long term (current) use of opiate analgesic: Secondary | ICD-10-CM | POA: Diagnosis not present

## 2017-07-23 DIAGNOSIS — G8194 Hemiplegia, unspecified affecting left nondominant side: Secondary | ICD-10-CM | POA: Diagnosis not present

## 2017-07-23 DIAGNOSIS — M109 Gout, unspecified: Secondary | ICD-10-CM | POA: Diagnosis not present

## 2017-07-23 DIAGNOSIS — Z7951 Long term (current) use of inhaled steroids: Secondary | ICD-10-CM | POA: Diagnosis not present

## 2017-07-23 DIAGNOSIS — E785 Hyperlipidemia, unspecified: Secondary | ICD-10-CM | POA: Diagnosis not present

## 2017-07-23 DIAGNOSIS — Z431 Encounter for attention to gastrostomy: Secondary | ICD-10-CM | POA: Diagnosis not present

## 2017-07-23 DIAGNOSIS — L989 Disorder of the skin and subcutaneous tissue, unspecified: Secondary | ICD-10-CM | POA: Diagnosis not present

## 2017-07-23 DIAGNOSIS — F329 Major depressive disorder, single episode, unspecified: Secondary | ICD-10-CM | POA: Diagnosis not present

## 2017-07-23 DIAGNOSIS — G47419 Narcolepsy without cataplexy: Secondary | ICD-10-CM | POA: Diagnosis not present

## 2017-07-24 DIAGNOSIS — M25561 Pain in right knee: Secondary | ICD-10-CM | POA: Diagnosis not present

## 2017-07-24 DIAGNOSIS — Z8673 Personal history of transient ischemic attack (TIA), and cerebral infarction without residual deficits: Secondary | ICD-10-CM | POA: Diagnosis not present

## 2017-07-24 DIAGNOSIS — M170 Bilateral primary osteoarthritis of knee: Secondary | ICD-10-CM | POA: Diagnosis not present

## 2017-07-24 DIAGNOSIS — I252 Old myocardial infarction: Secondary | ICD-10-CM | POA: Diagnosis not present

## 2017-07-24 DIAGNOSIS — M25562 Pain in left knee: Secondary | ICD-10-CM | POA: Diagnosis not present

## 2017-07-25 DIAGNOSIS — M1000 Idiopathic gout, unspecified site: Secondary | ICD-10-CM | POA: Diagnosis not present

## 2017-07-25 DIAGNOSIS — E7800 Pure hypercholesterolemia, unspecified: Secondary | ICD-10-CM | POA: Diagnosis not present

## 2017-07-25 DIAGNOSIS — I69391 Dysphagia following cerebral infarction: Secondary | ICD-10-CM | POA: Diagnosis not present

## 2017-07-25 DIAGNOSIS — I10 Essential (primary) hypertension: Secondary | ICD-10-CM | POA: Diagnosis not present

## 2017-07-25 DIAGNOSIS — J449 Chronic obstructive pulmonary disease, unspecified: Secondary | ICD-10-CM | POA: Diagnosis not present

## 2017-07-25 DIAGNOSIS — M25562 Pain in left knee: Secondary | ICD-10-CM | POA: Diagnosis not present

## 2017-07-25 DIAGNOSIS — Z9189 Other specified personal risk factors, not elsewhere classified: Secondary | ICD-10-CM | POA: Diagnosis not present

## 2017-07-25 DIAGNOSIS — R1312 Dysphagia, oropharyngeal phase: Secondary | ICD-10-CM | POA: Diagnosis not present

## 2017-07-25 DIAGNOSIS — J440 Chronic obstructive pulmonary disease with acute lower respiratory infection: Secondary | ICD-10-CM | POA: Diagnosis not present

## 2017-07-25 DIAGNOSIS — F418 Other specified anxiety disorders: Secondary | ICD-10-CM | POA: Diagnosis not present

## 2017-07-29 DIAGNOSIS — J440 Chronic obstructive pulmonary disease with acute lower respiratory infection: Secondary | ICD-10-CM | POA: Diagnosis not present

## 2017-07-29 DIAGNOSIS — C3490 Malignant neoplasm of unspecified part of unspecified bronchus or lung: Secondary | ICD-10-CM | POA: Diagnosis not present

## 2017-07-29 DIAGNOSIS — Z23 Encounter for immunization: Secondary | ICD-10-CM | POA: Diagnosis not present

## 2017-07-29 DIAGNOSIS — I10 Essential (primary) hypertension: Secondary | ICD-10-CM | POA: Diagnosis not present

## 2017-07-29 DIAGNOSIS — R634 Abnormal weight loss: Secondary | ICD-10-CM | POA: Diagnosis not present

## 2017-07-29 DIAGNOSIS — R05 Cough: Secondary | ICD-10-CM | POA: Diagnosis not present

## 2017-07-29 DIAGNOSIS — I639 Cerebral infarction, unspecified: Secondary | ICD-10-CM | POA: Diagnosis not present

## 2017-07-29 DIAGNOSIS — Z0001 Encounter for general adult medical examination with abnormal findings: Secondary | ICD-10-CM | POA: Diagnosis not present

## 2017-07-30 ENCOUNTER — Other Ambulatory Visit: Payer: Self-pay

## 2017-07-30 NOTE — Patient Outreach (Signed)
[facility]) Care Management  07/30/2017  Manuel Rivera Jun 15, 1946 [id number]   Telephone Screen  Referral Date:07/29/17 Referral Source: MD office-Dr. Nadara Mustard Referral Reason: "weakness, falls, cancer" Insurance: HTA   Outreach attempt # 1 to patient. Spoke with spouse(ROI on file). Screening call completed.    Social: Patient resides in his home along with his spouse and disabled dtr. Spouse states that patient is total care and dependent with ADLs/IADLs. She states that patient is very weak and unable to walk. He has multiple falls in the past. Spouse states at least 7-8 falls within the past three months. She denies any broken bones but some skin tears and bruising with falls. DME in the home include walker and wheelchair. Spouse reports that she has bene lifting an getting patient out of bed on her own. She voices some caregiver fatigue caring for both spouse and dtr. She is interested in possible other DME in the home to help make things easier including hospital bed and hoyer lift. Spouse states that t she along with her son or patient's brother have been taking patient to appts. She voices how physically difficult it is to get patient in and out of the car. She is open and interested in possible transportation options to help make transport to MD offices easier.    Conditions: Patient has PMH of HF,CAD, sinus bradycardia, subdural hematoma, seizures, MI, anxiety, arthritis, CKD, HTN, HLD, PVD, lung CA -status post radiation and chemo txs. Patient has PEG tube but spouse reports that they are not currently using it at present. She is only flushing tube daily. She states that patient has decreased/poor appetite. He is eating small amounts of soft food by mouth. She states that patient has had weight loss.    Medications: Spouse states patient is taking about 12-13 meds. She voices that they do struggle at times to afford meds. She reports especially with inhalers. She  reports that their church helps out with paying for some of their meds and the pharmacy allows them to keep a balance.    Appointments: Patient saw PCP on yesterday. He is also followed by neurosurgeon at [facility]. He is also followed by oncologist at [facility] and cardiologist Dr. Lovena Le.    Advance Directives: Spouse states that they have HCPOA completed with attorney. She does not recall giving medical team a copy. RN CM advised of importance of doing this. Spouse will follow up. She states that she is HCPOA for patient.    Consent: [facility] services reviewed and discussed with spouse. Verbal consent given by spouse.    Plan: RN CM will notify [facility] administrative assistant of case status. RN CM will send referral to [facility] RN for further in home eval/assessment of care needs and management of chronic conditions. RN CM will send [facility] SW referral for possible assistance with community resources related to transportation and in home support.  RN CM will send [place] referral for polypharmacy med review and possible med assistance.    Enzo Montgomery, RN,BSN,CCM [place] [facility] Telephonic Care Management Coordinator Direct Phone: [phone] Toll Free: [phone] Fax: [phone]

## 2017-07-31 ENCOUNTER — Other Ambulatory Visit: Payer: Self-pay | Admitting: *Deleted

## 2017-07-31 ENCOUNTER — Other Ambulatory Visit: Payer: Self-pay | Admitting: Pharmacist

## 2017-07-31 DIAGNOSIS — G8194 Hemiplegia, unspecified affecting left nondominant side: Secondary | ICD-10-CM | POA: Diagnosis not present

## 2017-07-31 NOTE — Patient Outreach (Addendum)
[place] [facility]) Care Management  [place]   07/31/2017  Manuel Rivera 1946/05/19 [id number]  Subjective: 71 year old male referred to [facility] by Dr. Nadara Mustard.  [place] services requested for medication review and medication assistance with inhalers.  PMHx includes, but not limited to, bradycardia, congestive heart failure (EF 35-40% 04/07/17), CAD s/p CABG '97, stents 6/'18, HTN, HLD, hypothyroidism, PVD, COPD, depression, anxiety, chronic kidney disease, former smoker, small cell lung cancer s/p radiation and chemotherapy, and squamous cell carcinoma s/p excision.  Noted recent hospitalizations for congestive heart failure and SDH s/p burr holes after which patient developed seizures and required PEG placement.    Successful call placed to Manuel Rivera today.  HIPAA identifiers verified. Manuel Rivera gave consent for me to discuss his medications with his spouse.  Spouse reports patient is weak and in pain today.  She reports she handles his medications and organizes a weekly pillbox for him.  Medications are delivered by [facility] in [place], [place].  Spouse is aware there is an option for compliance packaging as well from this pharmacy.  She is agreeable to a medication reconciliation.   Patient has PEG but is swallowing pills.   Objective:  SCr = 1.33 (04/16/2017), CrCl ~52 ml/min  Encounter Medications: Outpatient Encounter Prescriptions as of 07/31/2017  Medication Sig Note  . albuterol (PROVENTIL HFA;VENTOLIN HFA) 108 (90 Base) MCG/ACT inhaler Inhale 1-2 puffs into the lungs every 6 (six) hours as needed for wheezing or shortness of breath.   Marland Kitchen albuterol (PROVENTIL) (5 MG/ML) 0.5% nebulizer solution Take 2.5 mg by nebulization every 6 (six) hours as needed for wheezing or shortness of breath.   . allopurinol (ZYLOPRIM) 300 MG tablet Take 300 mg by mouth daily.   Marland Kitchen ALPRAZolam (XANAX) 0.5 MG tablet Take 1.5 mg by mouth at bedtime.    Marland Kitchen  aspirin EC 81 MG tablet Take 1 tablet (81 mg total) by mouth daily.   . beta carotene 25000 UNIT capsule Take by mouth. 06/19/2016: Received from: [place]: Take 1 capsule by mouth daily.  . budesonide (PULMICORT) 0.5 MG/2ML nebulizer solution Take 0.5 mg by nebulization 2 (two) times daily.   . calcium carbonate (OSCAL) 1500 (600 Ca) MG TABS tablet Take 600 mg of elemental calcium by mouth 2 (two) times daily with a meal.   . colchicine 0.6 MG tablet Take 0.6 mg by mouth 2 (two) times daily.   Marland Kitchen FLUoxetine (PROZAC) 20 MG capsule Take 20 mg by mouth daily.   . furosemide (LASIX) 80 MG tablet Take 1 tablet (80 mg total) by mouth daily.   Marland Kitchen HYDROcodone-acetaminophen (NORCO/VICODIN) 5-325 MG tablet Take 1 tablet by mouth every 6 (six) hours as needed for moderate pain.   Marland Kitchen levETIRAcetam (KEPPRA) 500 MG tablet Take 500 mg by mouth 2 (two) times daily.   Marland Kitchen levothyroxine (SYNTHROID, LEVOTHROID) 50 MCG tablet Take 50 mcg by mouth every morning.   Marland Kitchen lisinopril (PRINIVIL,ZESTRIL) 5 MG tablet Take 5 mg by mouth daily.   . magic mouthwash SOLN TAKE ONE TEASPOONFUL (5ML) BY MOUTH FOUR TIMES DAILY FOR SORES IN MOUTH FROM CHEMOTHEROPY AS NEEDED   . Multiple Vitamin (MULTIVITAMIN WITH MINERALS) TABS tablet Take 1 tablet by mouth daily.   . nitroGLYCERIN (NITROSTAT) 0.4 MG SL tablet Place 1 tablet (0.4 mg total) under the tongue every 5 (five) minutes as needed for chest pain.   Marland Kitchen nystatin (MYCOSTATIN) 100000 UNIT/ML suspension Take 5 mLs (  500,000 Units total) by mouth 3 (three) times daily.   . potassium chloride SA (K-DUR,KLOR-CON) 20 MEQ tablet Take 2 tablets (40 mEq total) by mouth daily.   . simvastatin (ZOCOR) 40 MG tablet Take 40 mg by mouth at bedtime.   Marland Kitchen tiotropium (SPIRIVA) 18 MCG inhalation capsule Place 18 mcg into inhaler and inhale at bedtime.     No facility-administered encounter medications on file as of 07/31/2017.     Functional Status: In your present  state of health, do you have any difficulty performing the following activities: 04/11/2017 03/25/2017  Hearing? N -  Vision? Y -  Comment - -  Difficulty concentrating or making decisions? N -  Walking or climbing stairs? N -  Comment - -  Dressing or bathing? N -  Doing errands, shopping? N N  Some recent data might be hidden    Fall/Depression Screening: Fall Risk  07/30/2017  Falls in the past year? Yes  Number falls in past yr: 2 or more  Injury with Fall? Yes  Risk Factor Category  High Fall Risk  Risk for fall due to : History of fall(s);Impaired balance/gait;Impaired mobility;Medication side effect;Impaired vision;Mental status change  Follow up Falls evaluation completed   PHQ 2/9 Scores 07/30/2017  Exception Documentation Other- indicate reason in comment box  Not completed call completed with spouse    Assessment:  Drugs sorted by system:  Neurologic/Psychologic: alprazolam, fluoxetine, levetiracetam  Cardiovascular: Aspirin 81mg , furosemide, lisinopril, simvastatin, potassium, PRN NTG SL  Pulmonary/Allergy: Albuterol, advair, tiotropium  Gastrointestinal: polyethylene glycol  Endocrine: allopurinol, colchicine, levothyroxine  Topical: magic mouthwash, nystatin suspension  Pain: hydrocodone-acetaminophen  Vitamins/Minerals: calcium carbonate, vitamin C, MVI   Medications to avoid in the elderly:  Alprazolam: This drug is identified in the [place]ers Criteria as a potentially inappropriate medication to be avoided in patients 65 years and older (independent of diagnosis or condition) due to increased risk of impaired cognition, delirium, falls, fractures, and motor vehicle accidents with benzodiazepine use.  Monitor closely and adjust as clinically warranted.    Drug interactions:  Colchicine / Simvastatin: The combination of a statin with colchicine may result in an increased potential (greater than for each agent alone) for myopathy.  Monitor closely and adjust  as clinically warranted.   Other issues noted:  1. Inhaler therapy needs to be clarified:  -Patient currently taking Advair 250/3mcg/dose, 1 puff BID x 1 month, then changing to Symbicort 160/4.52mcg/dose, 1 puff BID, however the usual dose for Symbicort for COPD is 2 puffs BID.     -Patient not currently taking Spiriva and unsure why it was stopped.   2. Patient not taking nystatin suspension.  Is this still an active medication?   3. Spouse reports difficulty paying for colchine and inhalers.  Patient does not qualify for LIS / Extra Help preliminarily. Spouse will gather financial documents and TROOP to see if patient qualifies for PAP (household of 3).      4.  Patient may benefit from compliance packaging of medications.  Spouse will consider this option and will discuss at next encounter.    Plan: I will route my note to Dr. Nadara Mustard to clarify inhaler regimen and nystatin therapy.  I will follow-up with Mrs. Kimrey in 1 week regarding medication assistance and compliance packaging.   [name], PharmD, [place] [phone]

## 2017-07-31 NOTE — Patient Outreach (Signed)
Telephone call to patient to schedule initial home visit, spoke with patient's wife who reports "he's asleep, you'll have to call back after 11". RN CM called back after 11 oclock and no answer to telephone, no option to leave voicemail.  PLAN Outreach pt tomorrow  Jacqlyn Larsen [facility], [place] Coordinator [phone]

## 2017-08-01 ENCOUNTER — Other Ambulatory Visit: Payer: Self-pay | Admitting: *Deleted

## 2017-08-01 NOTE — Patient Outreach (Signed)
Telephone call to pt to schedule initial home visit (2nd attempt), no answer to telephone and received message stating "due to network difficulties this call cannot be completed as dialed"  PLAN Outreach pt next week Schedule initial home visit  Jacqlyn Larsen [facility], [place] [phone]

## 2017-08-05 ENCOUNTER — Encounter: Payer: Self-pay | Admitting: Licensed Clinical Social Worker

## 2017-08-05 ENCOUNTER — Other Ambulatory Visit (HOSPITAL_COMMUNITY): Payer: Self-pay | Admitting: Neurological Surgery

## 2017-08-05 ENCOUNTER — Encounter: Payer: Self-pay | Admitting: *Deleted

## 2017-08-05 ENCOUNTER — Other Ambulatory Visit: Payer: Self-pay | Admitting: Licensed Clinical Social Worker

## 2017-08-05 ENCOUNTER — Other Ambulatory Visit: Payer: Self-pay | Admitting: *Deleted

## 2017-08-05 DIAGNOSIS — I2510 Atherosclerotic heart disease of native coronary artery without angina pectoris: Secondary | ICD-10-CM | POA: Diagnosis not present

## 2017-08-05 DIAGNOSIS — D696 Thrombocytopenia, unspecified: Secondary | ICD-10-CM | POA: Diagnosis not present

## 2017-08-05 DIAGNOSIS — R1311 Dysphagia, oral phase: Secondary | ICD-10-CM | POA: Diagnosis not present

## 2017-08-05 DIAGNOSIS — Z431 Encounter for attention to gastrostomy: Secondary | ICD-10-CM | POA: Diagnosis not present

## 2017-08-05 DIAGNOSIS — L989 Disorder of the skin and subcutaneous tissue, unspecified: Secondary | ICD-10-CM | POA: Diagnosis not present

## 2017-08-05 DIAGNOSIS — F419 Anxiety disorder, unspecified: Secondary | ICD-10-CM | POA: Diagnosis not present

## 2017-08-05 DIAGNOSIS — C3490 Malignant neoplasm of unspecified part of unspecified bronchus or lung: Secondary | ICD-10-CM | POA: Diagnosis not present

## 2017-08-05 DIAGNOSIS — G8194 Hemiplegia, unspecified affecting left nondominant side: Secondary | ICD-10-CM | POA: Diagnosis not present

## 2017-08-05 DIAGNOSIS — M109 Gout, unspecified: Secondary | ICD-10-CM | POA: Diagnosis not present

## 2017-08-05 DIAGNOSIS — I10 Essential (primary) hypertension: Secondary | ICD-10-CM | POA: Diagnosis not present

## 2017-08-05 DIAGNOSIS — F329 Major depressive disorder, single episode, unspecified: Secondary | ICD-10-CM | POA: Diagnosis not present

## 2017-08-05 DIAGNOSIS — G47419 Narcolepsy without cataplexy: Secondary | ICD-10-CM | POA: Diagnosis not present

## 2017-08-05 DIAGNOSIS — S065X9D Traumatic subdural hemorrhage with loss of consciousness of unspecified duration, subsequent encounter: Secondary | ICD-10-CM | POA: Diagnosis not present

## 2017-08-05 DIAGNOSIS — G40909 Epilepsy, unspecified, not intractable, without status epilepticus: Secondary | ICD-10-CM | POA: Diagnosis not present

## 2017-08-05 DIAGNOSIS — Z7902 Long term (current) use of antithrombotics/antiplatelets: Secondary | ICD-10-CM | POA: Diagnosis not present

## 2017-08-05 DIAGNOSIS — S065X9A Traumatic subdural hemorrhage with loss of consciousness of unspecified duration, initial encounter: Secondary | ICD-10-CM

## 2017-08-05 DIAGNOSIS — S065XAA Traumatic subdural hemorrhage with loss of consciousness status unknown, initial encounter: Secondary | ICD-10-CM

## 2017-08-05 DIAGNOSIS — E785 Hyperlipidemia, unspecified: Secondary | ICD-10-CM | POA: Diagnosis not present

## 2017-08-05 DIAGNOSIS — Z79891 Long term (current) use of opiate analgesic: Secondary | ICD-10-CM | POA: Diagnosis not present

## 2017-08-05 DIAGNOSIS — Z7951 Long term (current) use of inhaled steroids: Secondary | ICD-10-CM | POA: Diagnosis not present

## 2017-08-05 DIAGNOSIS — J449 Chronic obstructive pulmonary disease, unspecified: Secondary | ICD-10-CM | POA: Diagnosis not present

## 2017-08-05 NOTE — Patient Outreach (Signed)
Assessment:  CSW received referral on Manuel Rivera. CSW completed chart review on client on 08/05/17.  Client sees Dr. Rory Percy as primary care doctor. Client's spouse, Manuel Rivera, is [place] of Attorney for client.  CSW spoke via phone with Manuel Rivera on 08/05/17.  CSW verified identity of Manuel Rivera. CSW received verbal permission from Manuel Rivera on 08/05/17 for CSW to communicate with Manuel Rivera about current client needs and status. Manuel Rivera is [place] of Attorney for client. Manuel Rivera said that client is receiving home health physical therapy services as scheduled. CSW spoke with Manuel Rivera about client care plan. CSW encouraged that client participate in scheduled client physical therapy sessions for client in the home in the next 30 days with home health agency.  Client has prescribed medications and is taking medications as prescribed. Manuel Rivera stated that client is dependent with daily care. She said he needs assistance with activities of daily living.  Manuel Rivera said she transports client to many of client's scheduled medical appointments. She said her son also helps periodically in transporting client to and from client's medical appointments. Client is receiving [place] with [facility] pharmacist, Manuel Rivera. [facility] RN Manuel Rivera has received referral for [facility] nursing support for client. Client sees a cardiologist as scheduled. Client sees a neurologist as scheduled.  CSW and Manuel Rivera completed [facility] assessments needed for client. CSW provided Manuel Rivera with name, address and phone number of [facility] in [place], [place]. [place] spoke with Manuel Rivera about in home support services available (for hire) from that agency related to patient care. Manuel Rivera was appreciative of information about this in home care agency. CSW spoke with Manuel Rivera about [facility] program support in pharmacy, nursing, and social work.  CSW spoke with Manuel Rivera about her speaking with client's  primary care doctor, as needed, related to client's equipment needs in the home. CSW gave Manuel Rivera the [facility] CSW number of [phone]. CSW encouraged that client or Manuel Rivera please  call CSW at [phone] as needed to discuss social work needs of client. CSW thanked Manuel Rivera for phone call with CSW on 08/05/17. Manuel Rivera was appreciative of call from [place] on 08/05/17.  Plan:  Client to participate in scheduled client in home physical therapy sessions for client in the next 30 days with home health agency.  CSW to call client or spouse of client in 3 weeks to assess client needs at that time.  CSW to collaborate with RN Manuel Rivera as needed in monitoring needs of client.    Manuel Rivera MSW, LCSW Licensed Clinical Social Worker [facility] Care Management [phone]

## 2017-08-05 NOTE — Patient Outreach (Signed)
Telephone call to patient to schedule home visit, spoke with wife who reports " he's not available right now"  RN CM gave contact information and ask that patient call back.  RN CM mailed letter to patient's home requesting return phone call.  Jacqlyn Larsen [facility], [place] Coordinator [phone]

## 2017-08-06 ENCOUNTER — Ambulatory Visit: Payer: Self-pay | Admitting: Pharmacist

## 2017-08-06 ENCOUNTER — Other Ambulatory Visit: Payer: Self-pay | Admitting: Pharmacist

## 2017-08-06 NOTE — Patient Outreach (Addendum)
[place] [facility]) Care Management  08/06/2017  Manuel Rivera 1946/03/19 [id number]   Successful phone-call to Manuel Rivera & Manuel Rivera [facility] spouse, Manuel Rivera, this morning regarding Manuel Rivera's medications.  HIPAA identifiers verified.    Medication issues:  Spouse reports that she received a call from Dr. Lyman Speller office to clarify patient's medications.  She states patient should not be taking Spiriva right now, Nystatin use is PRN, and Symbicort dose confirmed 1 puff BID.   Medication Assistance: 1. TROOP: $2037.07 per Manuel Rivera's [facility]  2. Income:   As a household of 2, estimated annual income = [phone] (monthly = [phone], including SS and pension)  As a household of 3, including daughter who receives disability, estimated annual income = [phone] (including monthly $4054.86 + [phone])  Albuterol (ProAir HFA) -Manuel Rivera PAP -Requires proof of income  Symbicort: -[employer] PAP -Requires proof of income and TROOP (3% of income)  Colchicine:  -[employer] PAP (Manuel Rivera) -Requires proof of income   Patient's PCP: Dr. Rory Rivera  Plan: I will route patient assistance letter to pharmacy technician, Manuel Rivera, who will coordinate application process for Albuterol, Symbicort, and Colchicine and assist with obtaining all pertinent documents from both patient and provider and submit application once completed.    Patient's spouse is aware she can contact me at any time with questions.   Manuel Rivera, PharmD, [place] [phone]

## 2017-08-07 DIAGNOSIS — Z431 Encounter for attention to gastrostomy: Secondary | ICD-10-CM | POA: Diagnosis not present

## 2017-08-07 DIAGNOSIS — G47419 Narcolepsy without cataplexy: Secondary | ICD-10-CM | POA: Diagnosis not present

## 2017-08-07 DIAGNOSIS — Z7902 Long term (current) use of antithrombotics/antiplatelets: Secondary | ICD-10-CM | POA: Diagnosis not present

## 2017-08-07 DIAGNOSIS — R1311 Dysphagia, oral phase: Secondary | ICD-10-CM | POA: Diagnosis not present

## 2017-08-07 DIAGNOSIS — Z79891 Long term (current) use of opiate analgesic: Secondary | ICD-10-CM | POA: Diagnosis not present

## 2017-08-07 DIAGNOSIS — E785 Hyperlipidemia, unspecified: Secondary | ICD-10-CM | POA: Diagnosis not present

## 2017-08-07 DIAGNOSIS — I10 Essential (primary) hypertension: Secondary | ICD-10-CM | POA: Diagnosis not present

## 2017-08-07 DIAGNOSIS — G40909 Epilepsy, unspecified, not intractable, without status epilepticus: Secondary | ICD-10-CM | POA: Diagnosis not present

## 2017-08-07 DIAGNOSIS — C3490 Malignant neoplasm of unspecified part of unspecified bronchus or lung: Secondary | ICD-10-CM | POA: Diagnosis not present

## 2017-08-07 DIAGNOSIS — L989 Disorder of the skin and subcutaneous tissue, unspecified: Secondary | ICD-10-CM | POA: Diagnosis not present

## 2017-08-07 DIAGNOSIS — F419 Anxiety disorder, unspecified: Secondary | ICD-10-CM | POA: Diagnosis not present

## 2017-08-07 DIAGNOSIS — G8194 Hemiplegia, unspecified affecting left nondominant side: Secondary | ICD-10-CM | POA: Diagnosis not present

## 2017-08-07 DIAGNOSIS — I2510 Atherosclerotic heart disease of native coronary artery without angina pectoris: Secondary | ICD-10-CM | POA: Diagnosis not present

## 2017-08-07 DIAGNOSIS — S065X9D Traumatic subdural hemorrhage with loss of consciousness of unspecified duration, subsequent encounter: Secondary | ICD-10-CM | POA: Diagnosis not present

## 2017-08-07 DIAGNOSIS — Z7951 Long term (current) use of inhaled steroids: Secondary | ICD-10-CM | POA: Diagnosis not present

## 2017-08-07 DIAGNOSIS — D696 Thrombocytopenia, unspecified: Secondary | ICD-10-CM | POA: Diagnosis not present

## 2017-08-07 DIAGNOSIS — M109 Gout, unspecified: Secondary | ICD-10-CM | POA: Diagnosis not present

## 2017-08-07 DIAGNOSIS — F329 Major depressive disorder, single episode, unspecified: Secondary | ICD-10-CM | POA: Diagnosis not present

## 2017-08-07 DIAGNOSIS — J449 Chronic obstructive pulmonary disease, unspecified: Secondary | ICD-10-CM | POA: Diagnosis not present

## 2017-08-11 ENCOUNTER — Other Ambulatory Visit: Payer: Self-pay | Admitting: *Deleted

## 2017-08-11 ENCOUNTER — Other Ambulatory Visit: Payer: Self-pay | Admitting: Pharmacy Technician

## 2017-08-11 NOTE — Patient Outreach (Signed)
[facility]) Care Management  08/11/2017  TOUSSAINT GOLSON [birth year] [id number]  Contacted Dr. Rory Percy office in regards to Patient Assistance forms being incomplete. Left message with Nadyne Coombes to have provider to fill out the portion of all three forms with the drugs, strengths, directions, quantities, and refill amounts (Symbicort, Colcrys, and Proair). Nadyne Coombes stated she would make a note for provider and him refax when complete.  Maud Deed [place], [place] Management [phone]

## 2017-08-11 NOTE — Patient Outreach (Signed)
Patient received letter from RN CM requesting return phone call, patient's wife [name] called back, RN CM spoke with pt and permission given to speak with wife.  [name] states "what I need is some help in my home and I don't have a lot of money to pay for it" reports " I'm not putting him in a facility" "not selling my house because it's already a small house, can't downsize any further"  Wife reports [place] OT is still working with pt and PT had been recently working with pt due to multiple falls.  Wife reports " I do have trouble lifting him at times but a hoyer lift will not fit in my house, there's no room for it"  Wife states she is considering getting a hospital bed for pt and that she is going to talk to OT about this option and if she decides this is what she wants, she will get OT to assist her in getting from [place] (RN CM did offer to assist with this process and wife prefers to work with OT on this).  RN CM talked with wife about options for sitter services and wife states she has contact information for [facility] for assistance in the home.  Wife states " I'm pretty sure he doesn't qualify for medicaid", wife states she will talk with [facility] CSW about this option.  RN CM also ask wife to consider any close friends, neighbors, church members that could assist pt or that she could pay out of pocket which would most likely be cheaper than an agency.  RN CM offered to schedule home visit and wife states " I need the social worker and pharmacist but no need for a nurse"  Wife states due to taking care of her disabled daughter and husband she is knowledgeable.  Wife verbalizes CHF action plan and understands when to call doctor, pt not able to weigh due to being unsteady. RN CM sent in basket and placed telephone call to [place] and reported wife continues to request assistance about in home assistance for pt (outside of home health which pt already has), ask  that financial assessment be completed and assess if pt may qualify for medicaid. Reported wife refuses nursing care citing no needs.    Jacqlyn Larsen [facility], [place] Coordinator [phone]

## 2017-08-12 DIAGNOSIS — G40909 Epilepsy, unspecified, not intractable, without status epilepticus: Secondary | ICD-10-CM | POA: Diagnosis not present

## 2017-08-12 DIAGNOSIS — Z7951 Long term (current) use of inhaled steroids: Secondary | ICD-10-CM | POA: Diagnosis not present

## 2017-08-12 DIAGNOSIS — I10 Essential (primary) hypertension: Secondary | ICD-10-CM | POA: Diagnosis not present

## 2017-08-12 DIAGNOSIS — E785 Hyperlipidemia, unspecified: Secondary | ICD-10-CM | POA: Diagnosis not present

## 2017-08-12 DIAGNOSIS — C3490 Malignant neoplasm of unspecified part of unspecified bronchus or lung: Secondary | ICD-10-CM | POA: Diagnosis not present

## 2017-08-12 DIAGNOSIS — F329 Major depressive disorder, single episode, unspecified: Secondary | ICD-10-CM | POA: Diagnosis not present

## 2017-08-12 DIAGNOSIS — J449 Chronic obstructive pulmonary disease, unspecified: Secondary | ICD-10-CM | POA: Diagnosis not present

## 2017-08-12 DIAGNOSIS — D696 Thrombocytopenia, unspecified: Secondary | ICD-10-CM | POA: Diagnosis not present

## 2017-08-12 DIAGNOSIS — I2510 Atherosclerotic heart disease of native coronary artery without angina pectoris: Secondary | ICD-10-CM | POA: Diagnosis not present

## 2017-08-12 DIAGNOSIS — G47419 Narcolepsy without cataplexy: Secondary | ICD-10-CM | POA: Diagnosis not present

## 2017-08-12 DIAGNOSIS — R1311 Dysphagia, oral phase: Secondary | ICD-10-CM | POA: Diagnosis not present

## 2017-08-12 DIAGNOSIS — Z7902 Long term (current) use of antithrombotics/antiplatelets: Secondary | ICD-10-CM | POA: Diagnosis not present

## 2017-08-12 DIAGNOSIS — Z431 Encounter for attention to gastrostomy: Secondary | ICD-10-CM | POA: Diagnosis not present

## 2017-08-12 DIAGNOSIS — S065X9D Traumatic subdural hemorrhage with loss of consciousness of unspecified duration, subsequent encounter: Secondary | ICD-10-CM | POA: Diagnosis not present

## 2017-08-12 DIAGNOSIS — L989 Disorder of the skin and subcutaneous tissue, unspecified: Secondary | ICD-10-CM | POA: Diagnosis not present

## 2017-08-12 DIAGNOSIS — M109 Gout, unspecified: Secondary | ICD-10-CM | POA: Diagnosis not present

## 2017-08-12 DIAGNOSIS — F419 Anxiety disorder, unspecified: Secondary | ICD-10-CM | POA: Diagnosis not present

## 2017-08-12 DIAGNOSIS — Z79891 Long term (current) use of opiate analgesic: Secondary | ICD-10-CM | POA: Diagnosis not present

## 2017-08-12 DIAGNOSIS — G8194 Hemiplegia, unspecified affecting left nondominant side: Secondary | ICD-10-CM | POA: Diagnosis not present

## 2017-08-13 ENCOUNTER — Ambulatory Visit (HOSPITAL_COMMUNITY)
Admission: RE | Admit: 2017-08-13 | Discharge: 2017-08-13 | Disposition: A | Discharge status: Home / Self Care | Payer: PPO | Source: Ambulatory Visit | Attending: Neurological Surgery | Admitting: Neurological Surgery

## 2017-08-13 DIAGNOSIS — Z87820 Personal history of traumatic brain injury: Secondary | ICD-10-CM | POA: Diagnosis not present

## 2017-08-13 DIAGNOSIS — S065X9A Traumatic subdural hemorrhage with loss of consciousness of unspecified duration, initial encounter: Secondary | ICD-10-CM

## 2017-08-13 DIAGNOSIS — S065XAA Traumatic subdural hemorrhage with loss of consciousness status unknown, initial encounter: Secondary | ICD-10-CM

## 2017-08-13 DIAGNOSIS — I6200 Nontraumatic subdural hemorrhage, unspecified: Secondary | ICD-10-CM | POA: Diagnosis not present

## 2017-08-13 DIAGNOSIS — G319 Degenerative disease of nervous system, unspecified: Secondary | ICD-10-CM | POA: Diagnosis not present

## 2017-08-13 DIAGNOSIS — I6782 Cerebral ischemia: Secondary | ICD-10-CM | POA: Insufficient documentation

## 2017-08-15 ENCOUNTER — Ambulatory Visit (HOSPITAL_COMMUNITY): Payer: PPO

## 2017-08-15 DIAGNOSIS — I10 Essential (primary) hypertension: Secondary | ICD-10-CM | POA: Diagnosis not present

## 2017-08-15 DIAGNOSIS — L989 Disorder of the skin and subcutaneous tissue, unspecified: Secondary | ICD-10-CM | POA: Diagnosis not present

## 2017-08-15 DIAGNOSIS — C3490 Malignant neoplasm of unspecified part of unspecified bronchus or lung: Secondary | ICD-10-CM | POA: Diagnosis not present

## 2017-08-15 DIAGNOSIS — Z7902 Long term (current) use of antithrombotics/antiplatelets: Secondary | ICD-10-CM | POA: Diagnosis not present

## 2017-08-15 DIAGNOSIS — F329 Major depressive disorder, single episode, unspecified: Secondary | ICD-10-CM | POA: Diagnosis not present

## 2017-08-15 DIAGNOSIS — D696 Thrombocytopenia, unspecified: Secondary | ICD-10-CM | POA: Diagnosis not present

## 2017-08-15 DIAGNOSIS — M109 Gout, unspecified: Secondary | ICD-10-CM | POA: Diagnosis not present

## 2017-08-15 DIAGNOSIS — R1311 Dysphagia, oral phase: Secondary | ICD-10-CM | POA: Diagnosis not present

## 2017-08-15 DIAGNOSIS — Z7951 Long term (current) use of inhaled steroids: Secondary | ICD-10-CM | POA: Diagnosis not present

## 2017-08-15 DIAGNOSIS — G40909 Epilepsy, unspecified, not intractable, without status epilepticus: Secondary | ICD-10-CM | POA: Diagnosis not present

## 2017-08-15 DIAGNOSIS — I2510 Atherosclerotic heart disease of native coronary artery without angina pectoris: Secondary | ICD-10-CM | POA: Diagnosis not present

## 2017-08-15 DIAGNOSIS — J449 Chronic obstructive pulmonary disease, unspecified: Secondary | ICD-10-CM | POA: Diagnosis not present

## 2017-08-15 DIAGNOSIS — F419 Anxiety disorder, unspecified: Secondary | ICD-10-CM | POA: Diagnosis not present

## 2017-08-15 DIAGNOSIS — S065X9D Traumatic subdural hemorrhage with loss of consciousness of unspecified duration, subsequent encounter: Secondary | ICD-10-CM | POA: Diagnosis not present

## 2017-08-15 DIAGNOSIS — Z79891 Long term (current) use of opiate analgesic: Secondary | ICD-10-CM | POA: Diagnosis not present

## 2017-08-15 DIAGNOSIS — Z431 Encounter for attention to gastrostomy: Secondary | ICD-10-CM | POA: Diagnosis not present

## 2017-08-15 DIAGNOSIS — G8194 Hemiplegia, unspecified affecting left nondominant side: Secondary | ICD-10-CM | POA: Diagnosis not present

## 2017-08-15 DIAGNOSIS — G47419 Narcolepsy without cataplexy: Secondary | ICD-10-CM | POA: Diagnosis not present

## 2017-08-15 DIAGNOSIS — E785 Hyperlipidemia, unspecified: Secondary | ICD-10-CM | POA: Diagnosis not present

## 2017-08-16 DIAGNOSIS — Z7902 Long term (current) use of antithrombotics/antiplatelets: Secondary | ICD-10-CM | POA: Diagnosis not present

## 2017-08-16 DIAGNOSIS — I2510 Atherosclerotic heart disease of native coronary artery without angina pectoris: Secondary | ICD-10-CM | POA: Diagnosis not present

## 2017-08-16 DIAGNOSIS — E860 Dehydration: Secondary | ICD-10-CM | POA: Diagnosis not present

## 2017-08-16 DIAGNOSIS — Z85828 Personal history of other malignant neoplasm of skin: Secondary | ICD-10-CM | POA: Diagnosis not present

## 2017-08-16 DIAGNOSIS — R531 Weakness: Secondary | ICD-10-CM | POA: Diagnosis not present

## 2017-08-16 DIAGNOSIS — Z85118 Personal history of other malignant neoplasm of bronchus and lung: Secondary | ICD-10-CM | POA: Diagnosis not present

## 2017-08-16 DIAGNOSIS — Z79899 Other long term (current) drug therapy: Secondary | ICD-10-CM | POA: Diagnosis not present

## 2017-08-16 DIAGNOSIS — R7989 Other specified abnormal findings of blood chemistry: Secondary | ICD-10-CM | POA: Diagnosis not present

## 2017-08-16 DIAGNOSIS — I10 Essential (primary) hypertension: Secondary | ICD-10-CM | POA: Diagnosis not present

## 2017-08-16 DIAGNOSIS — C3490 Malignant neoplasm of unspecified part of unspecified bronchus or lung: Secondary | ICD-10-CM | POA: Diagnosis not present

## 2017-08-16 DIAGNOSIS — Z87891 Personal history of nicotine dependence: Secondary | ICD-10-CM | POA: Diagnosis not present

## 2017-08-19 ENCOUNTER — Ambulatory Visit (HOSPITAL_COMMUNITY): Payer: PPO

## 2017-08-22 ENCOUNTER — Other Ambulatory Visit: Payer: Self-pay | Admitting: Pharmacy Technician

## 2017-08-22 ENCOUNTER — Telehealth: Payer: Self-pay | Admitting: Pharmacist

## 2017-08-22 NOTE — Patient Outreach (Signed)
[facility]) Care Management  08/22/2017  Manuel Rivera 19-Jul-1946 [id number]   Called to follow up on applications that I mailed out on 08/08/17 for patient assistance for 3 medications patient is on. Patients wife is authorized to talk about patients care. I verified HIPAA info about Mr. Plain with [place]. [name] stated that since starting the process of patient assistance Hospice has been approved to step in to help with the care of [name]. [name] stated that she is not clear on whether Hospice will only cover his heart medications or all meds he is currently taking. I informed [name] that I would verify with [name] [facility] Pharmacist) to see if we could help clarify for her. I also informed her to hold on to the applications in case we do indeed need to proceed with the assistance process. She stated that she would.    I will contact [place] to help with contacting Hospice.  Maud Deed [place], [place] Management [phone]

## 2017-08-22 NOTE — Patient Outreach (Signed)
[facility]) Care Management  08/22/2017  Manuel Rivera [birth year] [id number]  11:41AM Incoming call received from [place], [name] regarding patient assistance for Manuel Rivera.  Mr. Mergenthaler is now being followed by Hospice and his wife is not sure if she needs to continue to complete PAP applications for colchicine, albuterol, and symbicort inhalers.   2:18PM Successful call placed to Manuel Rivera wife, Manuel Rivera. HIPAA identifiers verified. Manuel Rivera reports she spoke with representatives from hospice who stated hospice will not cover colchicine, albuterol, and symbicort.  Therefore I recommended that we continue to apply through PAPs for these medications.  Manuel Rivera understands these applications, if approved, will need to be renewed in 2019.  She understands that she needs to fill out the applications and mail back to [facility] with most recent tax forms as proof of income.    Plan: Follow-up with patient and spouse regarding PAP applications in the next 1-2 weeks.   Manuel Rivera, PharmD, [place] [phone]

## 2017-08-26 ENCOUNTER — Other Ambulatory Visit: Payer: Self-pay | Admitting: Licensed Clinical Social Worker

## 2017-08-26 NOTE — Patient Outreach (Signed)
Assessment:  CSW spoke via phone with Cornell Bourbon, spouse of client, on 08/26/17.  CSW verified identity of Windell Musson.  CSW received verbal permission from Qaadir Kent on 08/26/17 for CSW to communicate with [name] about client's current needs and status. Tessie Fass is [place] of Attorney for client. Client sees Dr. Rory Percy as primary care doctor. [name] said that client  is receiving home health physical therapy services as scheduled. CSW spoke with [name] about client care plan. CSW encouraged that client cooperate with in home care providers for client in the home in the next 30 days.  Client has prescribed medications and is taking medications as prescribed. [name] said that client needs assistance with activitries of dailiy living.  [name] has been transporting lient  to and from  many of client's scheduled medical appointments. She said her son also helps periodically in transporting client to and from client's medical appointments. Client is receivng [facility] pharmacy support with [facility] pharmacist, [name].  [facility] RN Jacqlyn Larsen had received referral for [facility] nursing support for client. Delrae Sawyers, spouse of client, has declined [place] support for client. At present , client has [facility] CSW support and [facility] pharmacy support. Client sees a cardiolgist as scheduled. Client sees a neurologist as scheduled. CSW has spoken with Tessie Fass previously about in home support services (for hire) from [place], [place] and [facility] in [place], [place]. [name] has talked with [name], [facility] pharmacist, about completion of Patient Assistance Program applications for certain client medications. [name] said she has also talked with Hospice representative related to client care in the home. [name] said that client is no longer able to participate in physical therapy sessions at present. She said client is resting more in the bed during the day. She is talking with Hospice representative about support with  durable medical equipment and with home support visits. [name] said that client is not having any pain issues at present. CSW reminded [name] of [facility] program support services in CSW and pharmacy. CSW invited [name] to call CSW at [phone] as needed to discuss social work needs of client. [name] was appreciative of CSW call to [facility] on 08/26/17.  Plan:  CSW to call client or Brodi Kari in 2 weeks to assess client needs at that time.  Client to cooperate with in home care providers for client in the home for the next 30 days.   Norva Riffle.Fleet [place] MSW, LCSW Licensed Clinical Social Worker [facility] Care Management [phone]

## 2017-08-28 ENCOUNTER — Other Ambulatory Visit: Payer: Self-pay | Admitting: *Deleted

## 2017-08-28 NOTE — Patient Outreach (Signed)
RN CM called patient, spoke with wife [name], HIPAA verified, RN CM explained that per collaboration with patient's insurance [employer] there was discussion about resource of supportive care program in [place], patient's wife reports " he's got hospice now and they're doing everything, including bathing"  Wife states she and pt finally decided hospice would be best thing for pt,  Wife states no further needs.  RN CM sent in basket to [place] and pharmacist informing pt now has hospice services.  Jacqlyn Larsen [facility], [place] Coordinator [phone]

## 2017-08-29 DIAGNOSIS — I519 Heart disease, unspecified: Secondary | ICD-10-CM | POA: Diagnosis not present

## 2017-08-31 DIAGNOSIS — G8194 Hemiplegia, unspecified affecting left nondominant side: Secondary | ICD-10-CM | POA: Diagnosis not present

## 2017-09-04 ENCOUNTER — Other Ambulatory Visit: Payer: Self-pay | Admitting: Pharmacy Technician

## 2017-09-04 NOTE — Patient Outreach (Signed)
[facility]) Care Management  09/04/2017  HOMERO HYSON 1946-10-18 [id number]   Successful Outreach call placed to Mr. Ricketts wife Tessie Fass, whom verified HIPAA identifiers. I'm following up with [name] in regards to applications that were mailed out previously. [name] stated she spoke to [facility] Pharmacist) and filled out the paperwork and mailed it back to [facility] on either Wednesday or Thursday of last week. I informed [name] that I would be on the look out for them and will follow up with her when I receive them.  Maud Deed [place], [place] Management [phone]

## 2017-09-09 ENCOUNTER — Other Ambulatory Visit: Payer: Self-pay | Admitting: Licensed Clinical Social Worker

## 2017-09-09 ENCOUNTER — Encounter: Payer: Self-pay | Admitting: Licensed Clinical Social Worker

## 2017-09-09 NOTE — Patient Outreach (Signed)
Assessment:  CSW communicated recently with RN Jacqlyn Larsen regarding client status. Almyra Free reported to [place] that client was now under Hospice care with [facility] [place], [place] spoke via phone with Huston Stonehocker, spouse of client, on 09/09/17. CSW verified identity of Delroy [place]. [name] reported that client is now under Hospice care with [facility] of [place], [place].  She said that client is taking medications as prescribed. She said Hospice care providers are assisting client in the home as scheduled. CSW talked with [place] about support services for client through [facility] of [place], [place]. [name] said that nurse with Hospice was making home visits to see client two times weekly. She said Hospice Chaplain had been out to see client recently.  CSW informed [name] that since client was now under [place] with [facility]osp[facility] of [place], [place], that [place] would discharge client on 09/09/17 from [place] services only.  [name] agreed to this plan.  CSW thanked [name] for phone call with CSW on 09/09/17. CSW encouraged [name] to continue to communicate with Hospice care providers to discuss ongoing needs of client.  Plan:  CSW is discharging Serita Butcher from [facility] CSW services only on 09/09/17 since client is now under care of [facility]osp[facility] of [place], [place].  CSW to inform Alycia Rossetti, Case Management Assistant, that [place] discharged client on 09/09/17 from [place] services only.  CSW to fax physician letter to Dr. Nadara Mustard informing Dr. Nadara Mustard that [place] discharged client from [place] services only on 09/09/17.  Norva Riffle.Kersti Scavone MSW, LCSW Licensed Clinical Social Worker [facility] Care Management [phone]

## 2017-09-15 ENCOUNTER — Other Ambulatory Visit: Payer: Self-pay | Admitting: Pharmacy Technician

## 2017-09-15 NOTE — Patient Outreach (Signed)
[facility]) Care Management  09/15/2017  Manuel Rivera Jan 19, 1946 [id number]  Received applications mailed in from patient on 09/08/17. I faxed completed application into [employer], [place].  Will contact companies on 11/28 to verify the status of the applications and will follow up with patient.  Maud Deed [place], [place] Management [phone]

## 2017-09-17 ENCOUNTER — Other Ambulatory Visit: Payer: Self-pay | Admitting: Pharmacy Technician

## 2017-09-17 ENCOUNTER — Ambulatory Visit: Payer: Self-pay | Admitting: Pharmacy Technician

## 2017-09-17 NOTE — Patient Outreach (Signed)
[facility]) Care Management  09/17/2017  BRYLEY KOVACEVIC Oct 12, 1946 [id number]  Successful outreach call to patients wife, [name]. HIPAA identifiers verified. I contacted Mrs. Kirsten after contacting all 3 drug companies for patient assistance to update her on the status.  [employer] trader) stated they had not received an application, I refaxed, then called to verify they received it. They verified that they had. I will follow up with them on Friday. [employer] (Symbicort) stated patients out of pocket expense had not been received and that the providers signature and date was not legible. I resent in script to provider to re-sign. After I receive it back I will refax to [employer]. Bernita Buffy (Colcrys) was approved until December 2019! They will be mailing out a 3 month script in the next couple of days.  [name] stated she understood where we are in the process of the other 2 applications. I also informed [name] that I would contact her when I follow up with the other 2 companies.  Maud Deed [place], [place] Management [phone]

## 2017-09-19 ENCOUNTER — Other Ambulatory Visit: Payer: Self-pay | Admitting: Pharmacy Technician

## 2017-09-19 NOTE — Patient Outreach (Signed)
[facility]) Care Management  09/19/2017  Manuel Rivera December 25, 1945 [id number]  Successful outreach call to [place]. Representative stated that Quantity on script for Symbicort would need to be changed from 1 to 3 inhalers. Sent script to Dr. Nadara Mustard to have him sign and date. Once I receive script back I will submit to [place].  Successful outreach call to [employer] for status on patients application for Proair. They stated patient is denied because he has Part D coverage.  Maud Deed [place], [place] Management [phone]

## 2017-09-22 ENCOUNTER — Encounter: Payer: Self-pay | Admitting: Pharmacist

## 2017-09-23 ENCOUNTER — Other Ambulatory Visit: Payer: Self-pay | Admitting: Pharmacy Technician

## 2017-09-23 NOTE — Patient Outreach (Signed)
[facility]) Care Management  09/23/2017  Manuel Rivera 25-Dec-1945 [id number]  Successful outreach call to [employer] in regards to patients application for Symbicort status. Representative stated patient is approved until 10/20/17. Patient will receive 3 inhalers in 7-10 business days.  Successful outreach call to patients wife, Manuel Rivera. HIPAA identifiers verified. Informed Manuel Rivera of patience approval for Symbicort inhalers. I also informed Mrs. Bores that I started the patient assistance for drug Proventil and that I mailed out her portion for them to sign today and attached a return envelope for her to send back to me. She stated that she would contact me once she mails the application back to me.  Maud Deed [place], [place] Management [phone]

## 2017-09-30 DIAGNOSIS — G8194 Hemiplegia, unspecified affecting left nondominant side: Secondary | ICD-10-CM | POA: Diagnosis not present

## 2017-10-02 ENCOUNTER — Ambulatory Visit: Payer: PPO | Admitting: Internal Medicine

## 2017-10-02 ENCOUNTER — Other Ambulatory Visit: Payer: Self-pay | Admitting: Pharmacy Technician

## 2017-10-02 NOTE — Patient Outreach (Signed)
[facility]) Care Management  10/02/2017  LEVIE WAGES 03/09/1946 [id number]   Successful Outreach call to patients provider. Spoke to [place] whom informed me that they have not received the provider portion of [employer] application (Proventil HFA) that I mailed out on 09/23/17. I requested Levada Dy to go online and print off page 2 of application and mail it back into me. She stated she would.  Received patients portion on 10/01/17  Maud Deed. [place], [place] Management [phone]

## 2017-10-07 ENCOUNTER — Other Ambulatory Visit: Payer: Self-pay | Admitting: Pharmacy Technician

## 2017-10-07 NOTE — Patient Outreach (Addendum)
[facility]) Care Management  10/07/2017  Manuel Rivera [birth year] [id number]   Successful outreach call to patients wife, Manuel Rivera, HIPAA identifiers verified. Informed Manuel Rivera that I mailed out the [employer] application on 90/93/11 and that in the next couple of weeks she should expect an attestation letter in the mail from [employer]. I informed her to sign and include other papers sent with letter in return envelope back to [employer]. I also requested that she contact me when she has received the letter and mailed it back so that I may follow up with [employer] in the following weeks.  Manuel Rivera informed that they received 2 Symbicort inhalers in the mail on 10/06/17 for Mr. Manuel Rivera. [place], [place] Management [phone]

## 2017-10-09 ENCOUNTER — Ambulatory Visit: Payer: PPO | Admitting: Adult Health

## 2017-10-23 ENCOUNTER — Other Ambulatory Visit: Payer: Self-pay | Admitting: Pharmacy Technician

## 2017-10-23 NOTE — Patient Outreach (Signed)
[facility]) Care Management  10/23/2017  HANLEY WOERNER 1946-08-24 [id number]   Successful outreach call to patients wife, [name]. HIPAA identifiers verified. Following up in reference to [employer] patient assistance application. [name] states that she has not received the attestation letter from [employer] that is required to be signed and returned, as of yet.  Maud Deed [place], [place] Management [phone]

## 2017-10-24 ENCOUNTER — Ambulatory Visit: Payer: Self-pay | Admitting: Pharmacy Technician

## 2017-10-24 ENCOUNTER — Other Ambulatory Visit: Payer: Self-pay | Admitting: Pharmacy Technician

## 2017-10-24 NOTE — Patient Outreach (Signed)
[facility] [facility]) Care Management  10/24/2017  KASTEN LEVEQUE 06-05-1946 [id number]   Unsuccessful outreach call to patient in reference to [employer] application. Calling to inform that I am mailing a return envelope to send [employer] application and attestation letter back to me so that I may include new Provider portion of application.  Maud Deed [place], [place] Management [phone]

## 2017-10-28 ENCOUNTER — Ambulatory Visit: Payer: Self-pay | Admitting: Pharmacy Technician

## 2017-10-31 ENCOUNTER — Other Ambulatory Visit: Payer: Self-pay | Admitting: Pharmacy Technician

## 2017-10-31 DIAGNOSIS — G8194 Hemiplegia, unspecified affecting left nondominant side: Secondary | ICD-10-CM | POA: Diagnosis not present

## 2017-11-04 ENCOUNTER — Telehealth: Payer: Self-pay | Admitting: Pharmacist

## 2017-11-04 NOTE — Patient Outreach (Signed)
[facility]) Care Management  11/04/2017  Manuel Rivera 02-28-1946 [id number]  Call placed to patient's wife, Manuel Rivera, to pass on condolences and let her know [facility] will end services.  [name] expressed understanding and thankfulness.    Plan: I will remove myself and [name], [name], from care team and close case.   [name], PharmD, [name] [phone]

## 2017-11-21 DEATH — deceased

## 2018-07-08 IMAGING — CT CT HEAD W/O CM
3 series · 15 of 47 positions shown, 18 images · non-contrast
Comparison: MRI 03/19/2017

CLINICAL DATA: Syncopal episode

EXAM:
CT HEAD WITHOUT CONTRAST
TECHNIQUE: Contiguous axial images were obtained from the base of the skull
through the vertex without intravenous contrast.

[Series 2: head wo · axial · 0.48mm/px · z∈[+22,+157]mm · 9 of 33 slices shown, 12 images]
[im 3/33  brain]
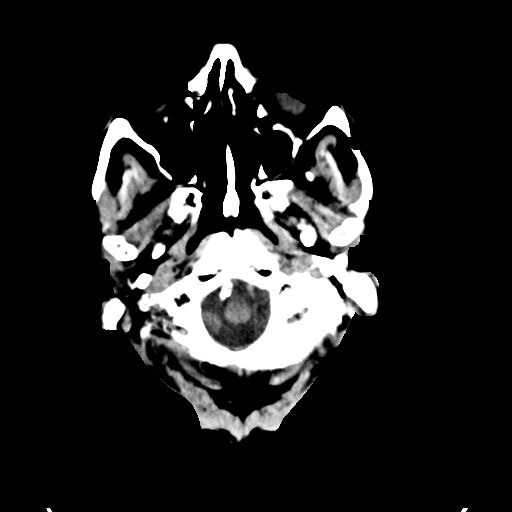
[im 3/33  bone]
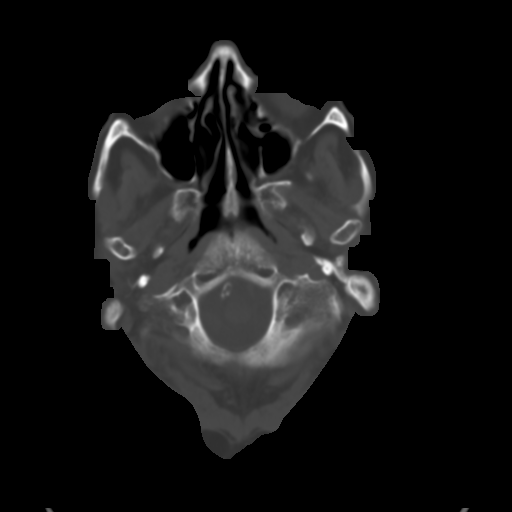
[im 6/33  brain]
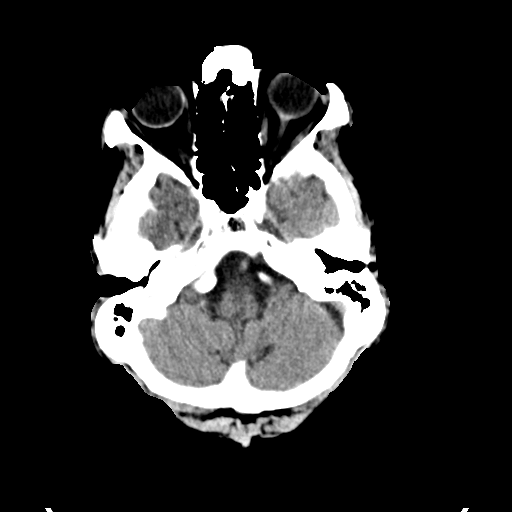
[im 9/33  brain]
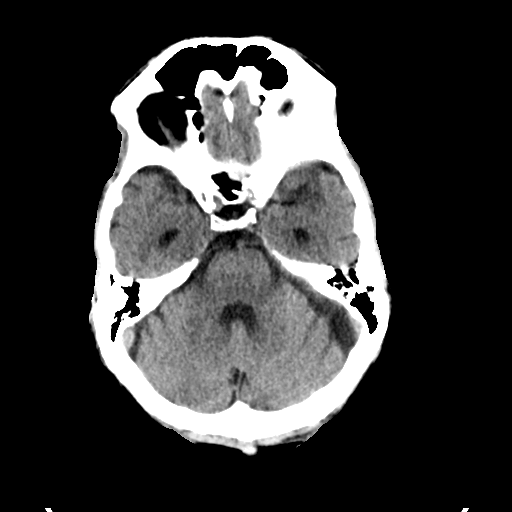
[im 13/33  brain]
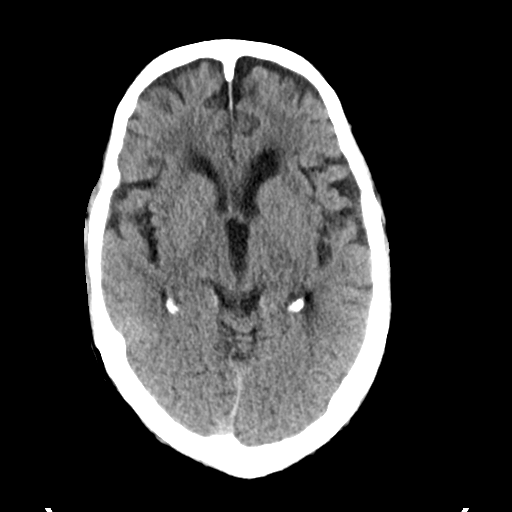
[im 17/33  brain]
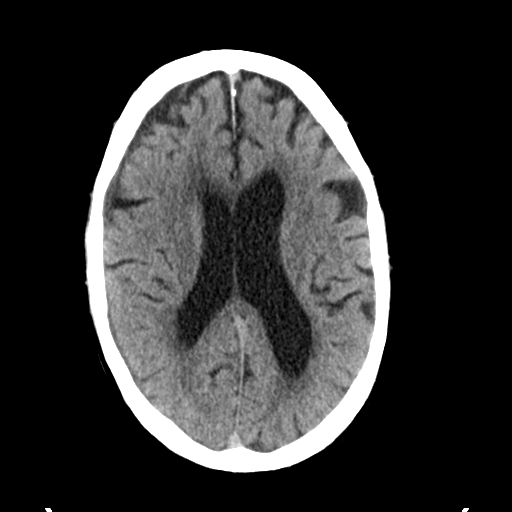
[im 17/33  bone]
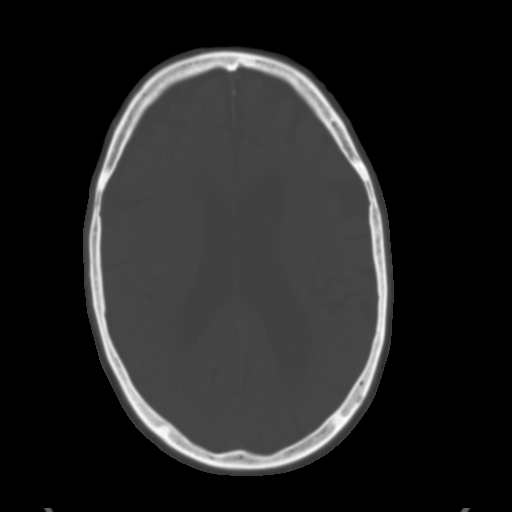
[im 20/33  brain]
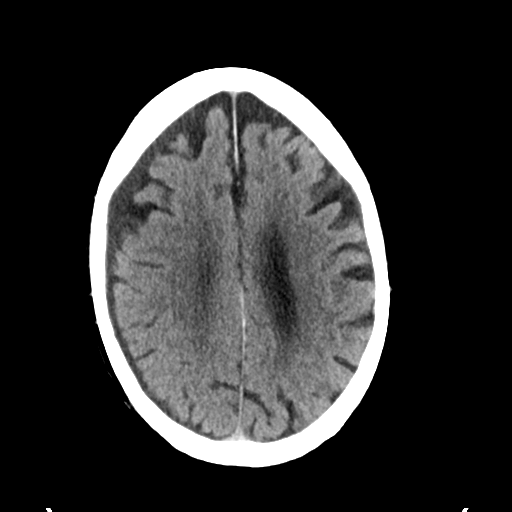
[im 24/33  brain]
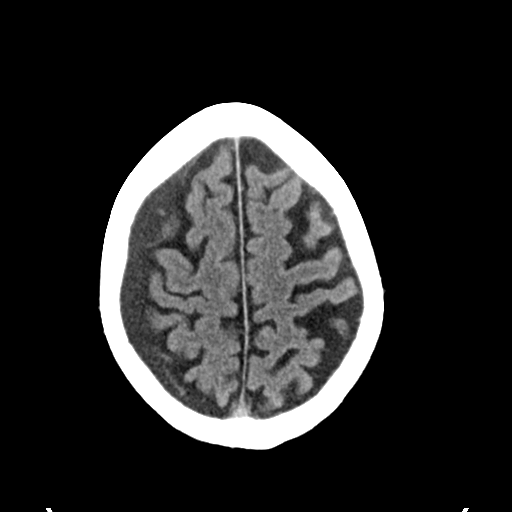
[im 27/33  brain]
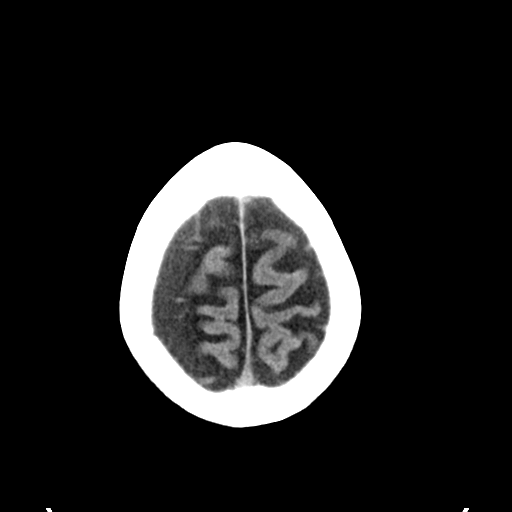
[im 30/33  brain]
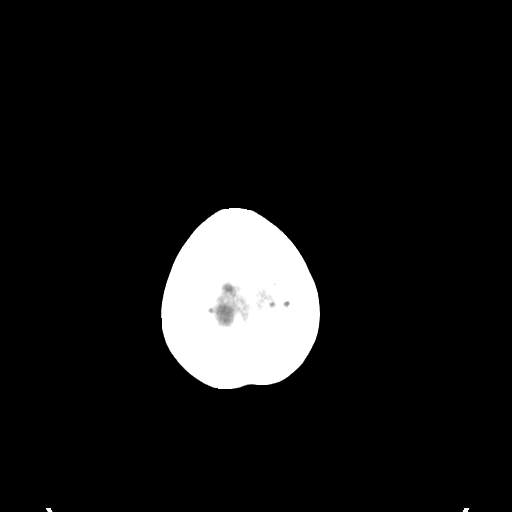
[im 30/33  bone]
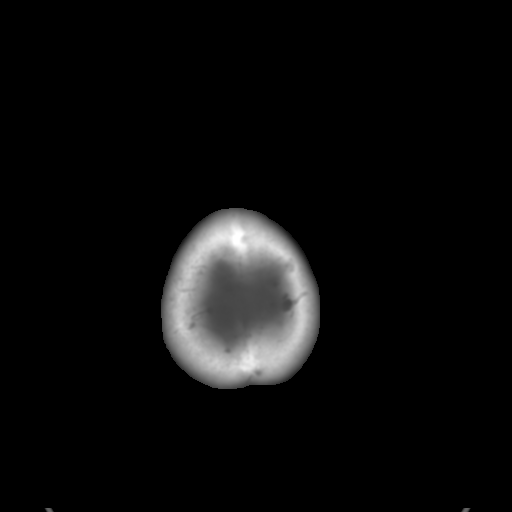

[Series 4: coronal soft tissue · coronal · 0.32mm/px · 3 of 72 slices shown]
[im 24/72  brain]
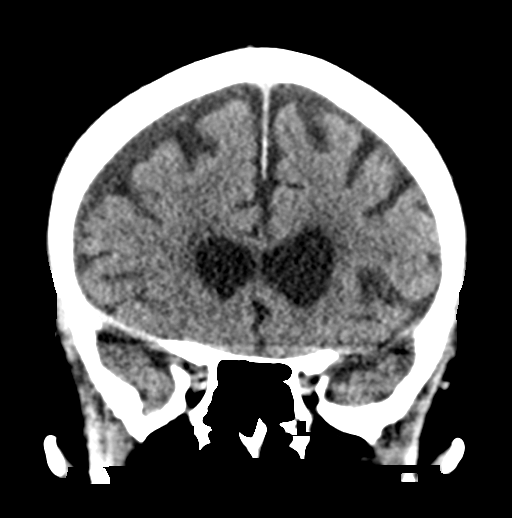
[im 32/72  brain]
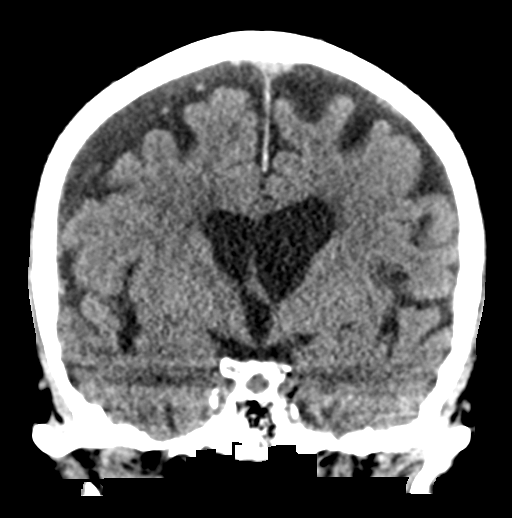
[im 40/72  brain]
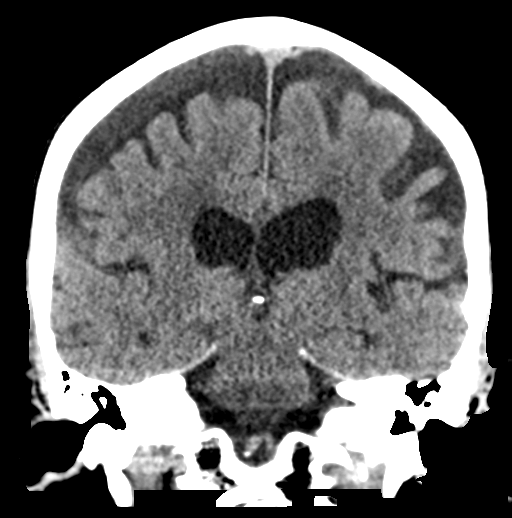

[Series 5: sagittal soft tissue · sagittal · 0.34mm/px · 3 of 52 slices shown]
[im 18/52  brain]
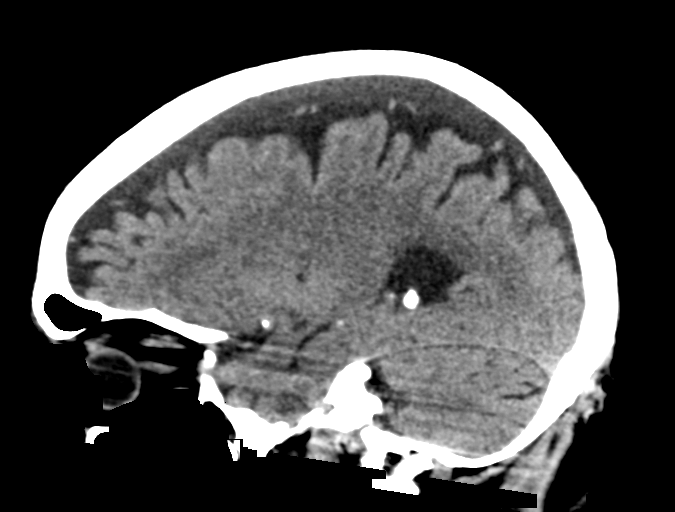
[im 26/52  brain]
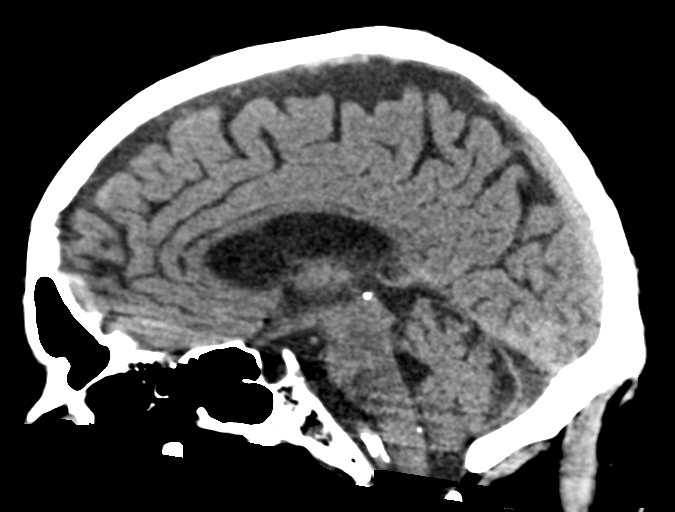
[im 35/52  brain]
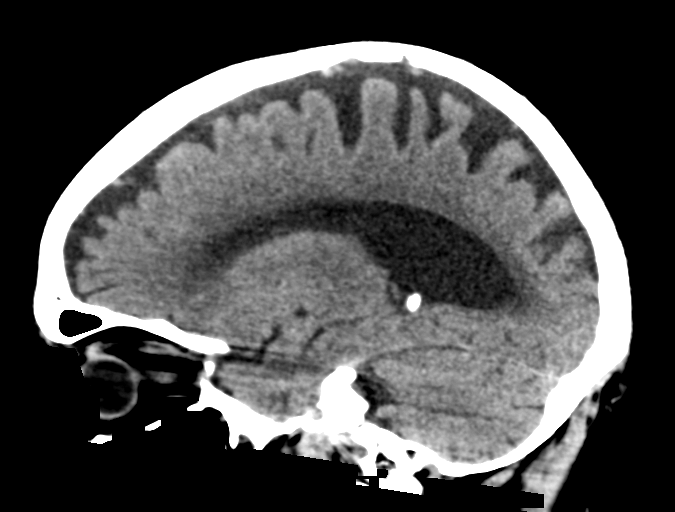

[15 of 47 positions shown; findings below may reference images not displayed]

FINDINGS: Brain: No acute territorial infarction or intracranial mass is seen.
Slightly complex right subdural collection, measuring 9 mm maximum
thickness on coronal views and probably not significantly changed
compared with MRI 03/19/2017. No midline shift. Mild periventricular
white matter small vessel ischemic changes. Atrophy. Stable
ventricle size.

Vascular: No hyperdense vessels. Carotid artery calcifications.
Vertebral artery calcifications.

Skull: No fracture or suspicious bone lesion.

Sinuses/Orbits: No acute finding.

Other: None
IMPRESSION: 1. 9 mm thick subdural collection along the right convexity. No
midline shift or significant mass effect. No apparent significant
interval change compared with MRI 03/19/2017 at which time this was
thought to represent a chronic subdural hematoma/hygroma. No
definite acute hemorrhage is seen at this time.
2. Mild white matter small vessel ischemic changes.

## 2018-07-08 IMAGING — DX DG CHEST 2V
2 series · 2 of 2 positions shown · non-contrast
Comparison: 01/29/2017

CLINICAL DATA: History of lung cancer.  Shortness of breath.

EXAM:
CHEST  2 VIEW

[chest pa]
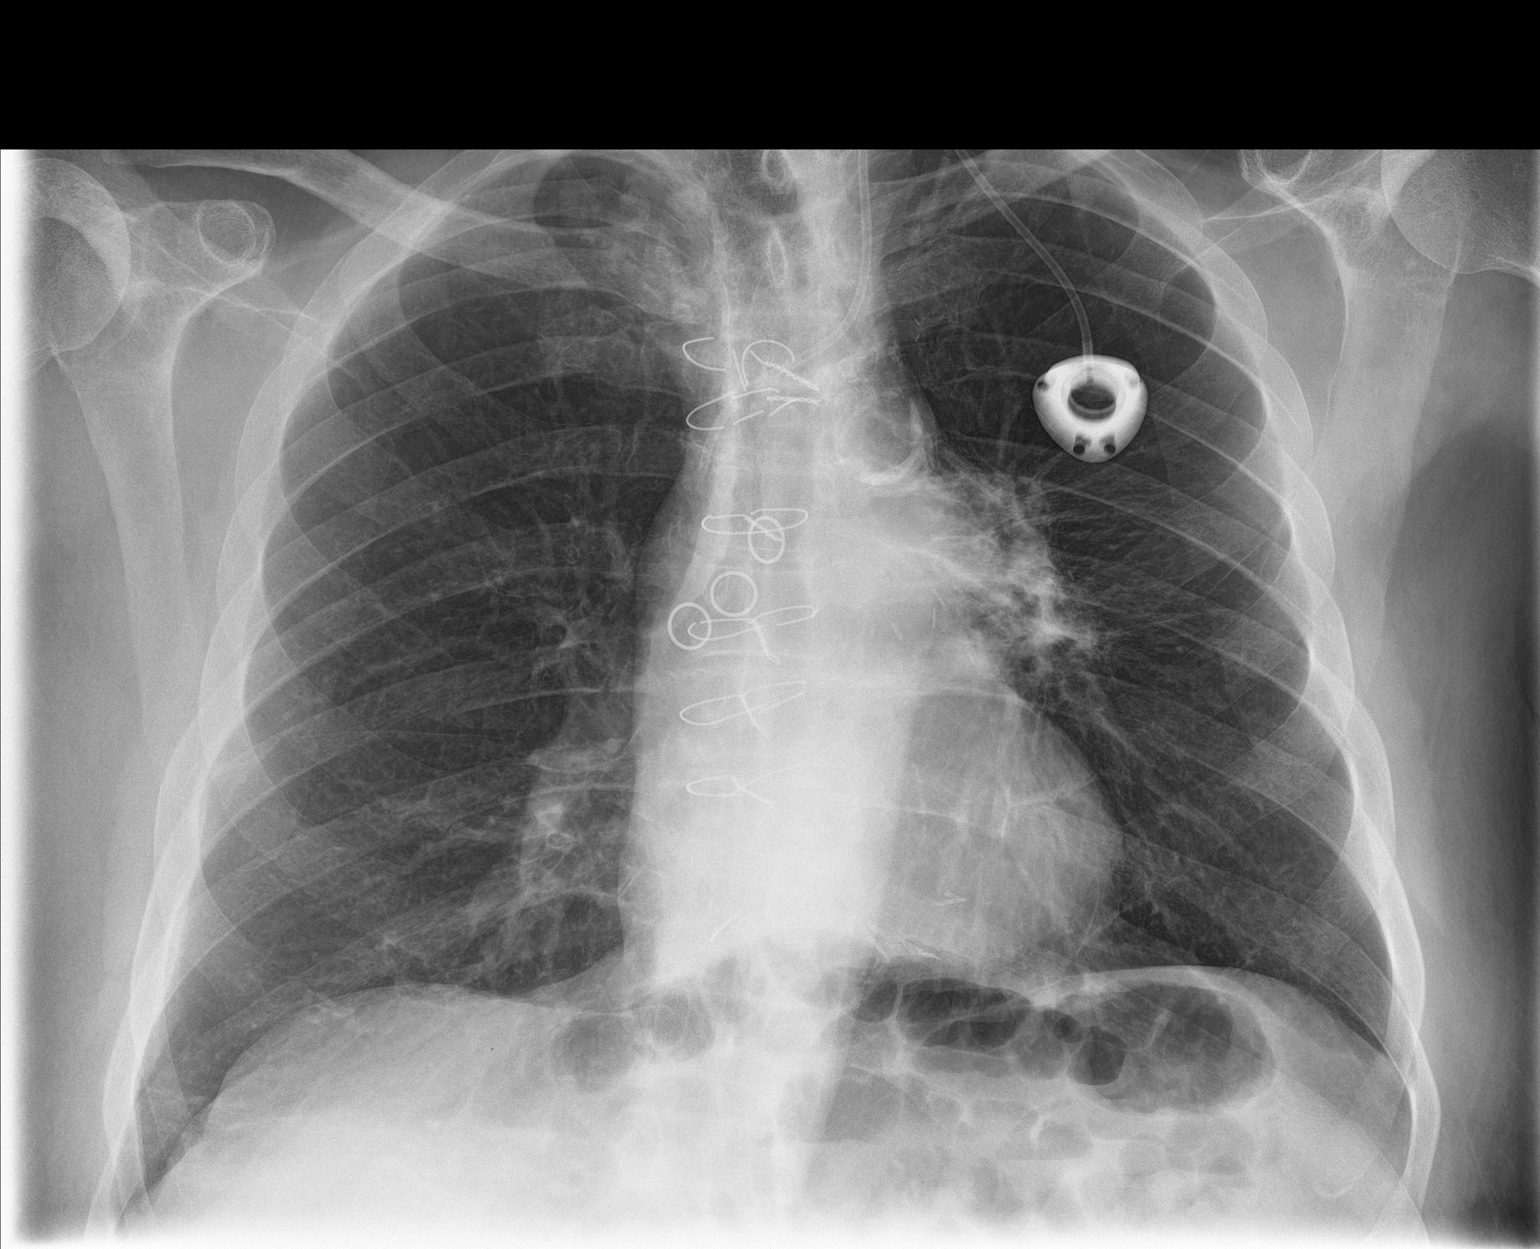

[chest lat]
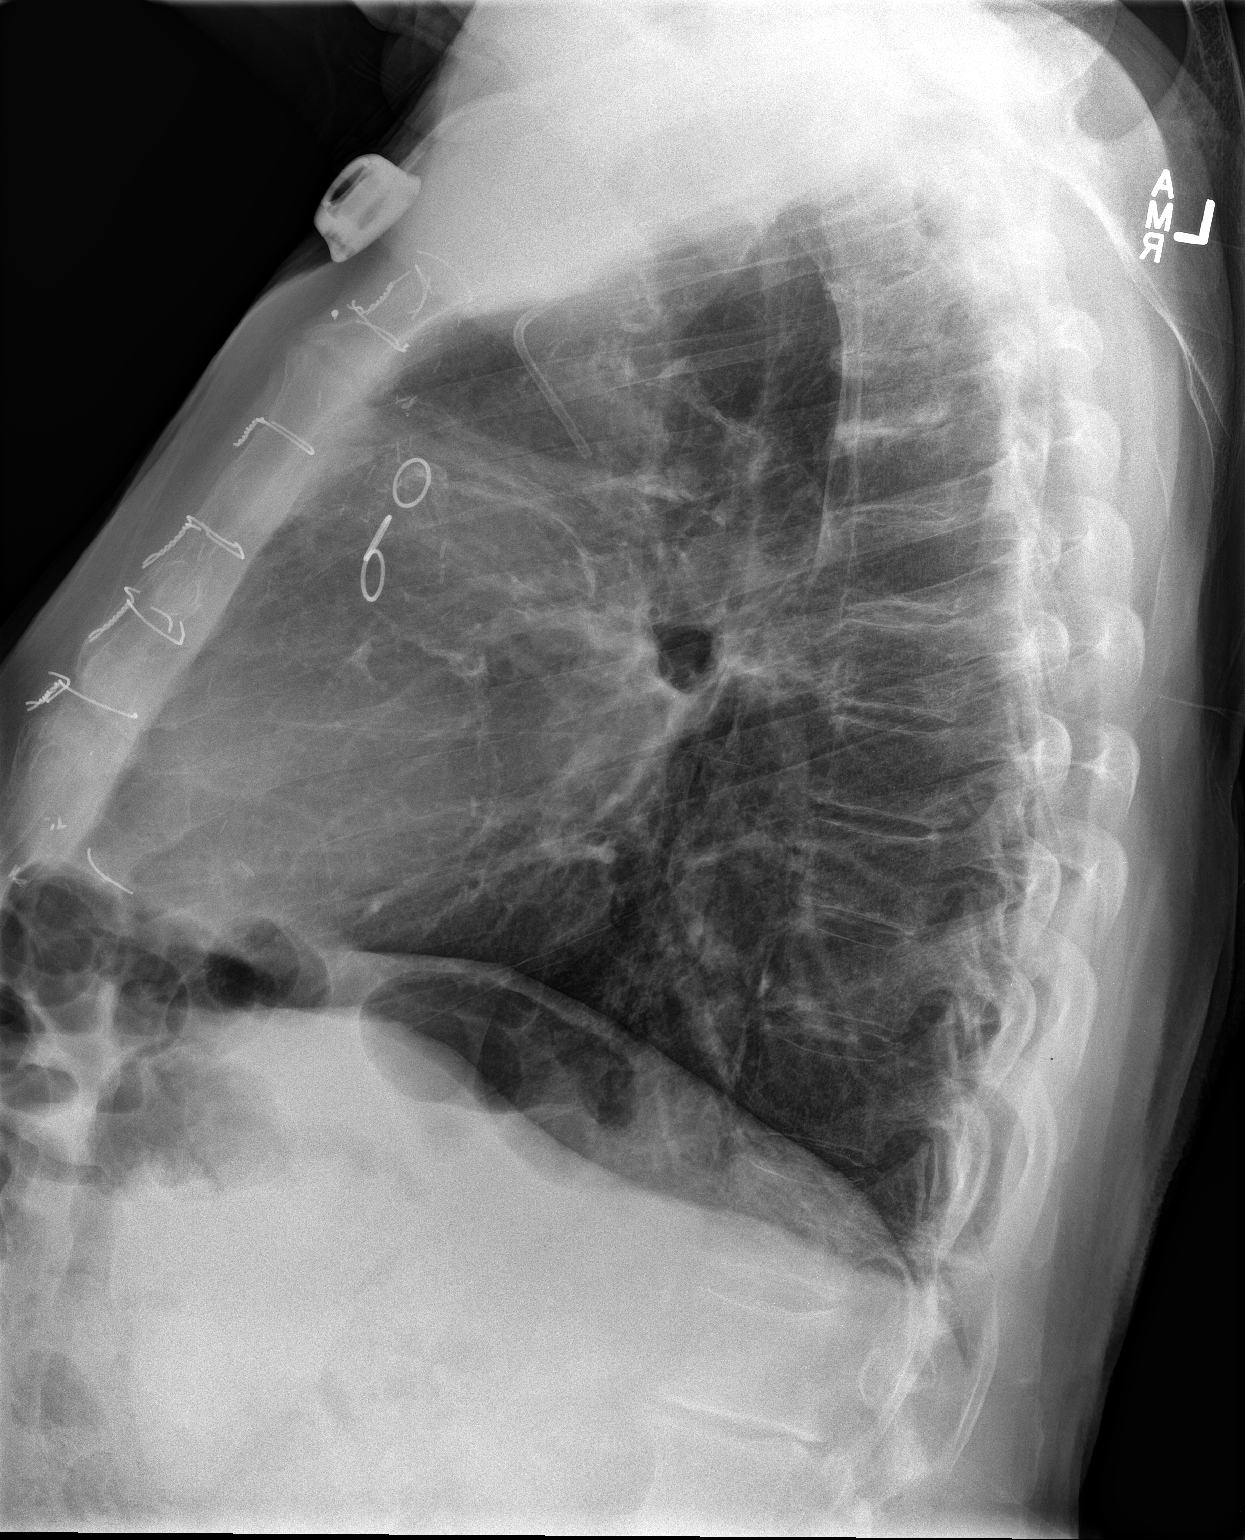

[2 of 2 positions shown; findings below may reference images not displayed]

FINDINGS: Injectable Port-A-Cath is in stable position. Postsurgical changes
from CABG, with re- demonstrated fractured sternal wires.

The cardiac silhouette is normal.

There is a relatively stable radiographically left suprahilar and
upper lobe spiculated density. No evidence of airspace
consolidation.

Osseous structures are without acute abnormality. Soft tissues are
grossly normal.
IMPRESSION: Relatively stable radiographically left suprahilar and left upper
lobe spiculated density. This may represent known post radiation
changes, however focal recurrence cannot be excluded.

Otherwise no evidence of focal airspace consolidation.
